# Patient Record
Sex: Female | Born: 1955 | Hispanic: No | Marital: Married | Smoking: Never smoker
Health system: Southern US, Community
[De-identification: ages and names within clinical notes are randomized; demographics above are authoritative.]

## PROBLEM LIST (undated history)

## (undated) DIAGNOSIS — L309 Dermatitis, unspecified: Secondary | ICD-10-CM

## (undated) DIAGNOSIS — I10 Essential (primary) hypertension: Secondary | ICD-10-CM

## (undated) DIAGNOSIS — M1990 Unspecified osteoarthritis, unspecified site: Secondary | ICD-10-CM

## (undated) DIAGNOSIS — I2510 Atherosclerotic heart disease of native coronary artery without angina pectoris: Secondary | ICD-10-CM

## (undated) DIAGNOSIS — M549 Dorsalgia, unspecified: Secondary | ICD-10-CM

## (undated) DIAGNOSIS — E039 Hypothyroidism, unspecified: Secondary | ICD-10-CM

## (undated) DIAGNOSIS — E079 Disorder of thyroid, unspecified: Secondary | ICD-10-CM

## (undated) DIAGNOSIS — R748 Abnormal levels of other serum enzymes: Secondary | ICD-10-CM

## (undated) DIAGNOSIS — E785 Hyperlipidemia, unspecified: Secondary | ICD-10-CM

## (undated) DIAGNOSIS — K5900 Constipation, unspecified: Secondary | ICD-10-CM

## (undated) DIAGNOSIS — K8020 Calculus of gallbladder without cholecystitis without obstruction: Secondary | ICD-10-CM

## (undated) DIAGNOSIS — R0602 Shortness of breath: Secondary | ICD-10-CM

## (undated) DIAGNOSIS — E739 Lactose intolerance, unspecified: Secondary | ICD-10-CM

## (undated) DIAGNOSIS — K219 Gastro-esophageal reflux disease without esophagitis: Secondary | ICD-10-CM

## (undated) HISTORY — DX: Unspecified osteoarthritis, unspecified site: M19.90

## (undated) HISTORY — PX: CARDIAC CATHETERIZATION: SHX172

## (undated) HISTORY — DX: Hyperlipidemia, unspecified: E78.5

## (undated) HISTORY — DX: Dorsalgia, unspecified: M54.9

## (undated) HISTORY — PX: BREAST SURGERY: SHX581

## (undated) HISTORY — DX: Disorder of thyroid, unspecified: E07.9

## (undated) HISTORY — DX: Shortness of breath: R06.02

## (undated) HISTORY — PX: LUMBAR FUSION: SHX111

## (undated) HISTORY — DX: Gastro-esophageal reflux disease without esophagitis: K21.9

---

## 2002-12-23 ENCOUNTER — Other Ambulatory Visit: Admission: RE | Admit: 2002-12-23 | Discharge: 2002-12-23 | Payer: Self-pay | Admitting: Family Medicine

## 2004-04-25 ENCOUNTER — Encounter: Admission: RE | Admit: 2004-04-25 | Discharge: 2004-07-24 | Payer: Self-pay | Admitting: Family Medicine

## 2004-05-09 ENCOUNTER — Other Ambulatory Visit: Admission: RE | Admit: 2004-05-09 | Discharge: 2004-05-09 | Payer: Self-pay | Admitting: Family Medicine

## 2006-04-02 ENCOUNTER — Other Ambulatory Visit: Admission: RE | Admit: 2006-04-02 | Discharge: 2006-04-02 | Payer: Self-pay | Admitting: Family Medicine

## 2007-04-24 ENCOUNTER — Other Ambulatory Visit: Admission: RE | Admit: 2007-04-24 | Discharge: 2007-04-24 | Payer: Self-pay | Admitting: *Deleted

## 2008-10-27 ENCOUNTER — Other Ambulatory Visit: Admission: RE | Admit: 2008-10-27 | Discharge: 2008-10-27 | Payer: Self-pay | Admitting: Interventional Radiology

## 2008-10-27 ENCOUNTER — Encounter: Admission: RE | Admit: 2008-10-27 | Discharge: 2008-10-27 | Payer: Self-pay | Admitting: Family Medicine

## 2008-10-27 ENCOUNTER — Encounter (INDEPENDENT_AMBULATORY_CARE_PROVIDER_SITE_OTHER): Payer: Self-pay | Admitting: Interventional Radiology

## 2010-01-26 ENCOUNTER — Encounter: Admission: RE | Admit: 2010-01-26 | Discharge: 2010-01-26 | Payer: Self-pay | Admitting: Surgery

## 2011-02-03 ENCOUNTER — Other Ambulatory Visit: Payer: Self-pay | Admitting: Family Medicine

## 2011-02-03 DIAGNOSIS — M545 Low back pain, unspecified: Secondary | ICD-10-CM

## 2011-02-04 ENCOUNTER — Ambulatory Visit
Admission: RE | Admit: 2011-02-04 | Discharge: 2011-02-04 | Disposition: A | Discharge status: Home / Self Care | Payer: BC Managed Care – PPO | Source: Ambulatory Visit | Attending: Family Medicine | Admitting: Family Medicine

## 2011-02-04 DIAGNOSIS — M545 Low back pain, unspecified: Secondary | ICD-10-CM

## 2011-12-04 ENCOUNTER — Other Ambulatory Visit: Payer: Self-pay | Admitting: Family Medicine

## 2011-12-04 DIAGNOSIS — E041 Nontoxic single thyroid nodule: Secondary | ICD-10-CM

## 2011-12-25 ENCOUNTER — Ambulatory Visit
Admission: RE | Admit: 2011-12-25 | Discharge: 2011-12-25 | Disposition: A | Discharge status: Home / Self Care | Payer: BC Managed Care – PPO | Source: Ambulatory Visit | Attending: Family Medicine | Admitting: Family Medicine

## 2011-12-25 DIAGNOSIS — E041 Nontoxic single thyroid nodule: Secondary | ICD-10-CM

## 2012-11-19 ENCOUNTER — Other Ambulatory Visit: Payer: Self-pay | Admitting: Family Medicine

## 2012-11-19 DIAGNOSIS — R1013 Epigastric pain: Secondary | ICD-10-CM

## 2012-11-22 ENCOUNTER — Other Ambulatory Visit: Payer: BC Managed Care – PPO

## 2012-11-28 ENCOUNTER — Ambulatory Visit
Admission: RE | Admit: 2012-11-28 | Discharge: 2012-11-28 | Disposition: A | Discharge status: Home / Self Care | Payer: BC Managed Care – PPO | Source: Ambulatory Visit | Attending: Family Medicine | Admitting: Family Medicine

## 2012-11-28 DIAGNOSIS — R1013 Epigastric pain: Secondary | ICD-10-CM

## 2012-12-03 ENCOUNTER — Encounter (INDEPENDENT_AMBULATORY_CARE_PROVIDER_SITE_OTHER): Payer: Self-pay

## 2012-12-10 ENCOUNTER — Encounter (INDEPENDENT_AMBULATORY_CARE_PROVIDER_SITE_OTHER): Payer: Self-pay | Admitting: Surgery

## 2012-12-11 ENCOUNTER — Ambulatory Visit (INDEPENDENT_AMBULATORY_CARE_PROVIDER_SITE_OTHER): Payer: BC Managed Care – PPO | Admitting: Surgery

## 2012-12-11 ENCOUNTER — Encounter (INDEPENDENT_AMBULATORY_CARE_PROVIDER_SITE_OTHER): Payer: Self-pay | Admitting: Surgery

## 2012-12-11 VITALS — BP 144/98 | HR 64 | Temp 97.0°F | Resp 20 | Ht 59.0 in | Wt 167.6 lb

## 2012-12-11 DIAGNOSIS — K8010 Calculus of gallbladder with chronic cholecystitis without obstruction: Secondary | ICD-10-CM | POA: Insufficient documentation

## 2012-12-11 NOTE — Patient Instructions (Signed)
  [facility]  LAPAROSCOPIC SURGERY - POST-OP INSTRUCTIONS  Always review your discharge instruction sheet given to you by the facility where your surgery was performed.  A prescription for pain medication may be given to you upon discharge.  Take your pain medication as prescribed.  If narcotic pain medicine is not needed, then you may take acetaminophen (Tylenol) or ibuprofen (Advil) as needed.  Take your usually prescribed medications unless otherwise directed.  If you need a refill on your pain medication, please contact your pharmacy.  They will contact our office to request authorization. Prescriptions will not be filled after 5 P.M. or on weekends.  You should follow a light diet the first few days after arrival home, such as soup and crackers or toast.  Be sure to include plenty of fluids daily.  Most patients will experience some swelling and bruising in the area of the incisions.  Ice packs will help.  Swelling and bruising can take several days to resolve.   It is common to experience some constipation if taking pain medication after surgery.  Increasing fluid intake and taking a stool softener (such as Colace) will usually help or prevent this problem from occurring.  A mild laxative (Milk of Magnesia or Miralax) should be taken according to package instructions if there are no bowel movements after 48 hours.  Unless discharge instructions indicate otherwise, you may remove your bandages 24-48 hours after surgery, and you may shower at that time.  You may have steri-strips (small skin tapes) in place directly over the incision.  These strips should be left on the skin for 7-10 days.  If your surgeon used skin glue on the incision, you may shower in 24 hours.  The glue will flake off over the next 2-3 weeks.  Any sutures or staples will be removed at the office during your follow-up visit.  ACTIVITIES:  You may resume regular (light) daily activities beginning the  next day-such as daily self-care, walking, climbing stairs-gradually increasing activities as tolerated.  You may have sexual intercourse when it is comfortable.  Refrain from any heavy lifting or straining until approved by your doctor.  You may drive when you are no longer taking prescription pain medication, you can comfortably wear a seatbelt, and you can safely maneuver your car and apply brakes.  You should see your doctor in the office for a follow-up appointment approximately 2-3 weeks after your surgery.  Make sure that you call for this appointment within a day or two after you arrive home to insure a convenient appointment time.  WHEN TO CALL YOUR DOCTOR: 1. Fever over 101.0 2. Inability to urinate 3. Continued bleeding from incision 4. Increased pain, redness, or drainage from the incision 5. Increasing abdominal pain  The clinic staff is available to answer your questions during regular business hours.  Please don't hesitate to call and ask to speak to one of the nurses for clinical concerns.  If you have a medical emergency, go to the nearest emergency room or call 911.  A surgeon from [facility] is always on call for the hospital.  Brazen Domangue M. Renika Shiflet, MD, FACS [facility] Office: [phone] Toll Free:  [phone] [phone]  Web site: [email or web address] 

## 2012-12-11 NOTE — Progress Notes (Signed)
General Surgery [facility]  Chief Complaint  Patient presents with  . Cholelithiasis    new pt- eval GB w/ stones - referral from Dr. Catha Gosselin    HISTORY: The patient is a 57 year old female from [place]. She was previously seen in my practice for thyroid nodules. She is now referred by her primary care physician for newly diagnosed symptomatic cholelithiasis.  Patient developed epigastric abdominal pain. This associated with mild nausea. She denies emesis. She notes occasional chills. She denies acholic stools. She denies jaundice. She has had no prior abdominal surgery.  Patient underwent abdominal ultrasound on 11/29/2012. This shows multiple small gallstones. There is no biliary dilatation. There is no sign of acute cholecystitis. Laboratory studies show minimal elevation of SGOT and SGPT with a normal total bilirubin.  Past Medical History  Diagnosis Date  . Back pain   . Arthritis   . Thyroid disease   . GERD (gastroesophageal reflux disease)   . Hyperlipidemia      Current Outpatient Prescriptions  Medication Sig Dispense Refill  . aspirin 81 MG tablet Take 81 mg by mouth daily.      . calcium carbonate (OS-CAL) 600 MG TABS Take 600 mg by mouth daily.      . Cholecalciferol (VITAMIN D PO) Take 200 mg by mouth daily.      Marland Kitchen levothyroxine (SYNTHROID, LEVOTHROID) 75 MCG tablet Take 75 mcg by mouth daily.      . magnesium gluconate (MAGONATE) 500 MG tablet Take 500 mg by mouth 2 (two) times daily.      . Multiple Vitamins-Minerals (MULTIVITAMIN WITH MINERALS) tablet Take 1 tablet by mouth daily.      . simvastatin (ZOCOR) 40 MG tablet Take 40 mg by mouth every evening.         No Known Allergies   Family History  Problem Relation Age of Onset  . Heart disease Father      History   Social History  . Marital Status: Married    Spouse Name: N/A    Number of Children: N/A  . Years of Education: N/A   Social History Main Topics  .  Smoking status: Never Smoker   . Smokeless tobacco: None  . Alcohol Use: No  . Drug Use: No  . Sexually Active:    Other Topics Concern  . None   Social History Narrative  . None     REVIEW OF SYSTEMS - PERTINENT POSITIVES ONLY: Intermittent epigastric and right upper quadrant abdominal pain following meals. No jaundice. No acholic stools. Occasional chills.  EXAM: Filed Vitals:   12/11/12 0938  BP: 144/98  Pulse: 64  Temp: 97 F (36.1 C)  Resp: 20    HEENT: normocephalic; pupils equal and reactive; sclerae clear; dentition good; mucous membranes moist NECK:  Palpable small nodule inferior left thyroid lobe; symmetric on extension; no palpable anterior or posterior cervical lymphadenopathy; no supraclavicular masses; no tenderness CHEST: clear to auscultation bilaterally without rales, rhonchi, or wheezes CARDIAC: regular rate and rhythm without significant murmur; peripheral pulses are full ABDOMEN: soft without distension; bowel sounds present; no mass; no hepatosplenomegaly; no hernia; minimal tenderness right upper quadrant and epigastrium EXT:  non-tender without edema; no deformity NEURO: no gross focal deficits; no sign of tremor   LABORATORY RESULTS: See [facility] (CHL-Epic) for most recent results   RADIOLOGY RESULTS: See [facility] (CHL-Epic) for most recent results   IMPRESSION: Symptomatic cholelithiasis  PLAN: I discussed the indications for surgery with  the patient and her daughter. I explained laparoscopic cholecystectomy with intraoperative cholangiography. There is a possibility of common bile duct stones due to the minimally elevated transaminases.  We discussed the hospital stay, the procedure, and the postoperative recovery to be anticipated. I provided them with written literature to review. They understand and wish to proceed with surgery in the near future.  The risks and benefits of the procedure have been discussed at length with  the patient.  The patient understands the proposed procedure, potential alternative treatments, and the course of recovery to be expected.  All of the patient's questions have been answered at this time.  The patient wishes to proceed with surgery.  Velora Heckler, MD, FACS General & Endocrine Surgery [facility]   Visit Diagnoses: 1. Cholelithiasis with cholecystitis     Primary Care Physician: No primary provider on file.

## 2012-12-18 ENCOUNTER — Encounter (HOSPITAL_COMMUNITY): Payer: Self-pay | Admitting: Pharmacy Technician

## 2012-12-18 ENCOUNTER — Encounter (HOSPITAL_COMMUNITY)
Admission: RE | Admit: 2012-12-18 | Discharge: 2012-12-18 | Disposition: A | Discharge status: Home / Self Care | Payer: BC Managed Care – PPO | Source: Ambulatory Visit | Attending: Surgery | Admitting: Surgery

## 2012-12-18 ENCOUNTER — Encounter (HOSPITAL_COMMUNITY): Payer: Self-pay

## 2012-12-18 HISTORY — DX: Dermatitis, unspecified: L30.9

## 2012-12-18 HISTORY — DX: Calculus of gallbladder without cholecystitis without obstruction: K80.20

## 2012-12-18 HISTORY — DX: Hypothyroidism, unspecified: E03.9

## 2012-12-18 HISTORY — DX: Constipation, unspecified: K59.00

## 2012-12-18 HISTORY — DX: Abnormal levels of other serum enzymes: R74.8

## 2012-12-18 HISTORY — DX: Lactose intolerance, unspecified: E73.9

## 2012-12-18 LAB — COMPREHENSIVE METABOLIC PANEL
ALT: 50 U/L — ABNORMAL HIGH (ref 0–35)
AST: 40 U/L — ABNORMAL HIGH (ref 0–37)
Albumin: 3.9 g/dL (ref 3.5–5.2)
Alkaline Phosphatase: 61 U/L (ref 39–117)
BUN: 7 mg/dL (ref 6–23)
CO2: 29 mEq/L (ref 19–32)
Calcium: 10.2 mg/dL (ref 8.4–10.5)
Chloride: 102 mEq/L (ref 96–112)
Creatinine, Ser: 0.52 mg/dL (ref 0.50–1.10)
GFR calc Af Amer: >90 mL/min (ref >90)
GFR calc non Af Amer: >90 mL/min (ref >90)
Glucose, Bld: 100 mg/dL — ABNORMAL HIGH (ref 70–99)
Potassium: 4.4 mEq/L (ref 3.5–5.1)
Sodium: 141 mEq/L (ref 135–145)
Total Bilirubin: 0.4 mg/dL (ref 0.3–1.2)
Total Protein: 7.6 g/dL (ref 6.0–8.3)

## 2012-12-18 LAB — SURGICAL PCR SCREEN
MRSA, PCR: NEGATIVE
Staphylococcus aureus: NEGATIVE

## 2012-12-18 LAB — CBC
HCT: 37.1 % (ref 36.0–46.0)
Hemoglobin: 12.4 g/dL (ref 12.0–15.0)
MCH: 28.9 pg (ref 26.0–34.0)
MCHC: 33.4 g/dL (ref 30.0–36.0)
MCV: 86.5 fL (ref 78.0–100.0)
Platelets: 304 10*3/uL (ref 150–400)
RBC: 4.29 MIL/uL (ref 3.87–5.11)
RDW: 13 % (ref 11.5–15.5)
WBC: 7.5 10*3/uL (ref 4.0–10.5)

## 2012-12-18 NOTE — Patient Instructions (Addendum)
Kyndall Fawaz  12/18/2012                           YOUR PROCEDURE IS SCHEDULED ON: 12/26/12               PLEASE REPORT TO SHORT STAY CENTER AT :  9:15 AM               CALL THIS NUMBER IF ANY PROBLEMS THE DAY OF SURGERY :               [phone]                      REMEMBER:   Do not eat food  AFTER MIDNIGHT / MAY HAVE CLEAR LIQUIDS UNTIL 6 HRS PREOP (6:00 AM)    Take these medicines the morning of surgery with A SIP OF WATER:  LEVOTHYROXINE   Do not wear jewelry, make-up   Do not wear lotions, powders, or perfumes.   Do not shave legs or underarms 12 hrs. before surgery (men may shave face)  Do not bring valuables to the hospital.  Contacts, dentures or bridgework may not be worn into surgery.  Leave suitcase in the car. After surgery it may be brought to your room.  For patients admitted to the hospital more than one night, checkout time is 11:00                          The day of discharge.   Patients discharged the day of surgery will not be allowed to drive home                             If going home same day of surgery, must have someone stay with you first                           24 hrs at home and arrange for some one to drive you home from hospital.    Special Instructions:   Please read over the following fact sheets that you were given:               1. MRSA  INFORMATION                      2. [place] PREPARING FOR SURGERY SHEET               3. STOP ALL ASPIRIN AND HERBAL PRODUCTS 5 DAYS PREOP                                                X_____________________________________________________________________        Failure to follow these instructions may result in cancellation of your surgery

## 2012-12-26 ENCOUNTER — Observation Stay (HOSPITAL_COMMUNITY)
Admission: RE | Admit: 2012-12-26 | Discharge: 2012-12-27 | Disposition: A | Discharge status: Home / Self Care | Payer: BC Managed Care – PPO | Source: Ambulatory Visit | Attending: Surgery | Admitting: Surgery

## 2012-12-26 ENCOUNTER — Encounter (HOSPITAL_COMMUNITY): Payer: Self-pay | Admitting: Anesthesiology

## 2012-12-26 ENCOUNTER — Ambulatory Visit (HOSPITAL_COMMUNITY): Payer: BC Managed Care – PPO | Admitting: Anesthesiology

## 2012-12-26 ENCOUNTER — Encounter (HOSPITAL_COMMUNITY)
Admission: RE | Disposition: A | Discharge status: Home / Self Care | Payer: Self-pay | Source: Ambulatory Visit | Attending: Surgery

## 2012-12-26 ENCOUNTER — Encounter (HOSPITAL_COMMUNITY): Payer: Self-pay | Admitting: *Deleted

## 2012-12-26 ENCOUNTER — Ambulatory Visit (HOSPITAL_COMMUNITY): Payer: BC Managed Care – PPO

## 2012-12-26 ENCOUNTER — Encounter (HOSPITAL_COMMUNITY): Payer: Self-pay | Admitting: Surgery

## 2012-12-26 DIAGNOSIS — Z7982 Long term (current) use of aspirin: Secondary | ICD-10-CM | POA: Insufficient documentation

## 2012-12-26 DIAGNOSIS — Z01812 Encounter for preprocedural laboratory examination: Secondary | ICD-10-CM | POA: Insufficient documentation

## 2012-12-26 DIAGNOSIS — E039 Hypothyroidism, unspecified: Secondary | ICD-10-CM | POA: Insufficient documentation

## 2012-12-26 DIAGNOSIS — K8010 Calculus of gallbladder with chronic cholecystitis without obstruction: Principal | ICD-10-CM | POA: Insufficient documentation

## 2012-12-26 DIAGNOSIS — E785 Hyperlipidemia, unspecified: Secondary | ICD-10-CM | POA: Insufficient documentation

## 2012-12-26 DIAGNOSIS — Z79899 Other long term (current) drug therapy: Secondary | ICD-10-CM | POA: Insufficient documentation

## 2012-12-26 DIAGNOSIS — K219 Gastro-esophageal reflux disease without esophagitis: Secondary | ICD-10-CM | POA: Insufficient documentation

## 2012-12-26 HISTORY — PX: CHOLECYSTECTOMY: SHX55

## 2012-12-26 SURGERY — LAPAROSCOPIC CHOLECYSTECTOMY WITH INTRAOPERATIVE CHOLANGIOGRAM
Anesthesia: General | Site: Abdomen | Wound class: Contaminated

## 2012-12-26 MED ORDER — CEFAZOLIN SODIUM-DEXTROSE 2-3 GM-% IV SOLR
2.0000 g | INTRAVENOUS | Status: AC
  Administered 2012-12-26: 2 g via INTRAVENOUS

## 2012-12-26 MED ORDER — BUPIVACAINE-EPINEPHRINE 0.5% -1:200000 IJ SOLN
INTRAMUSCULAR | Status: DC | PRN
  Administered 2012-12-26: 20 mL

## 2012-12-26 MED ORDER — 0.9 % SODIUM CHLORIDE (POUR BTL) OPTIME
TOPICAL | Status: DC | PRN
  Administered 2012-12-26: 1000 mL

## 2012-12-26 MED ORDER — LACTATED RINGERS IV SOLN
INTRAVENOUS | Status: DC | PRN
  Administered 2012-12-26: 1000 mL via INTRAVENOUS

## 2012-12-26 MED ORDER — IOHEXOL 300 MG/ML  SOLN
INTRAMUSCULAR | Status: DC | PRN
  Administered 2012-12-26: 12 mL via INTRAVENOUS

## 2012-12-26 MED ORDER — LACTATED RINGERS IV SOLN
INTRAVENOUS | Status: AC
  Administered 2012-12-26: 1000 mL via INTRAVENOUS

## 2012-12-26 MED ORDER — ONDANSETRON HCL 4 MG/2ML IJ SOLN
INTRAMUSCULAR | Status: DC | PRN
  Administered 2012-12-26: 4 mg via INTRAVENOUS

## 2012-12-26 MED ORDER — ACETAMINOPHEN 325 MG PO TABS: 650.0000 mg | ORAL_TABLET | ORAL | Status: DC | PRN

## 2012-12-26 MED ORDER — HYDROCODONE-ACETAMINOPHEN 5-325 MG PO TABS: 1.0000 | ORAL_TABLET | ORAL | Status: DC | PRN

## 2012-12-26 MED ORDER — METOCLOPRAMIDE HCL 5 MG/ML IJ SOLN
10.0000 mg | Freq: Once | INTRAMUSCULAR | Status: AC | PRN
  Administered 2012-12-26: 10 mg via INTRAVENOUS

## 2012-12-26 MED ORDER — ONDANSETRON HCL 4 MG PO TABS
4.0000 mg | ORAL_TABLET | Freq: Four times a day (QID) | ORAL | Status: DC | PRN
  Filled 2012-12-26: qty 1

## 2012-12-26 MED ORDER — HYDROMORPHONE HCL PF 1 MG/ML IJ SOLN: 1.0000 mg | INTRAMUSCULAR | Status: DC | PRN

## 2012-12-26 MED ORDER — KETOROLAC TROMETHAMINE 30 MG/ML IJ SOLN
INTRAMUSCULAR | Status: DC | PRN
  Administered 2012-12-26: 30 mg via INTRAVENOUS

## 2012-12-26 MED ORDER — KCL IN DEXTROSE-NACL 20-5-0.45 MEQ/L-%-% IV SOLN
INTRAVENOUS | Status: DC
  Administered 2012-12-26: 1000 mL via INTRAVENOUS
  Administered 2012-12-27: 11:00:00 via INTRAVENOUS
  Filled 2012-12-26 (×2): qty 1000

## 2012-12-26 MED ORDER — GLYCOPYRROLATE 0.2 MG/ML IJ SOLN
INTRAMUSCULAR | Status: DC | PRN
  Administered 2012-12-26: 0.6 mg via INTRAVENOUS

## 2012-12-26 MED ORDER — PROPOFOL 10 MG/ML IV BOLUS
INTRAVENOUS | Status: DC | PRN
  Administered 2012-12-26: 120 mg via INTRAVENOUS

## 2012-12-26 MED ORDER — LIDOCAINE HCL (CARDIAC) 20 MG/ML IV SOLN
INTRAVENOUS | Status: DC | PRN
  Administered 2012-12-26: 100 mg via INTRAVENOUS

## 2012-12-26 MED ORDER — DEXAMETHASONE SODIUM PHOSPHATE 10 MG/ML IJ SOLN
INTRAMUSCULAR | Status: DC | PRN
  Administered 2012-12-26: 10 mg via INTRAVENOUS

## 2012-12-26 MED ORDER — OXYCODONE HCL 5 MG/5ML PO SOLN
5.0000 mg | Freq: Once | ORAL | Status: DC | PRN
  Filled 2012-12-26: qty 5

## 2012-12-26 MED ORDER — OXYCODONE HCL 5 MG PO TABS: 5.0000 mg | ORAL_TABLET | Freq: Once | ORAL | Status: DC | PRN

## 2012-12-26 MED ORDER — ROCURONIUM BROMIDE 100 MG/10ML IV SOLN
INTRAVENOUS | Status: DC | PRN
  Administered 2012-12-26: 30 mg via INTRAVENOUS
  Administered 2012-12-26: 10 mg via INTRAVENOUS

## 2012-12-26 MED ORDER — FENTANYL CITRATE 0.05 MG/ML IJ SOLN
INTRAMUSCULAR | Status: DC | PRN
  Administered 2012-12-26 (×2): 50 ug via INTRAVENOUS

## 2012-12-26 MED ORDER — NEOSTIGMINE METHYLSULFATE 1 MG/ML IJ SOLN
INTRAMUSCULAR | Status: DC | PRN
  Administered 2012-12-26: 3 mg via INTRAVENOUS

## 2012-12-26 MED ORDER — ONDANSETRON HCL 4 MG/2ML IJ SOLN
4.0000 mg | Freq: Four times a day (QID) | INTRAMUSCULAR | Status: DC | PRN
  Administered 2012-12-26: 4 mg via INTRAVENOUS
  Filled 2012-12-26: qty 2

## 2012-12-26 MED ORDER — HYDROMORPHONE HCL PF 1 MG/ML IJ SOLN
0.2500 mg | INTRAMUSCULAR | Status: DC | PRN
  Administered 2012-12-26 (×2): 0.5 mg via INTRAVENOUS

## 2012-12-26 MED ORDER — EPHEDRINE SULFATE 50 MG/ML IJ SOLN
INTRAMUSCULAR | Status: DC | PRN
  Administered 2012-12-26 (×2): 5 mg via INTRAVENOUS

## 2012-12-26 MED ORDER — MIDAZOLAM HCL 5 MG/5ML IJ SOLN
INTRAMUSCULAR | Status: DC | PRN
  Administered 2012-12-26: 2 mg via INTRAVENOUS

## 2012-12-26 MED ORDER — SUCCINYLCHOLINE CHLORIDE 20 MG/ML IJ SOLN
INTRAMUSCULAR | Status: DC | PRN
  Administered 2012-12-26: 100 mg via INTRAVENOUS

## 2012-12-26 SURGICAL SUPPLY — 42 items
APL SKNCLS STERI-STRIP NONHPOA (GAUZE/BANDAGES/DRESSINGS) ×1
APPLIER CLIP ROT 10 11.4 M/L (STAPLE) ×2
APR CLP MED LRG 11.4X10 (STAPLE) ×1
BAG SPEC RTRVL LRG 6X4 10 (ENDOMECHANICALS) ×1
BENZOIN TINCTURE PRP APPL 2/3 (GAUZE/BANDAGES/DRESSINGS) ×2 IMPLANT
CABLE HIGH FREQUENCY MONO STRZ (ELECTRODE) ×2 IMPLANT
CANISTER SUCTION 2500CC (MISCELLANEOUS) ×2 IMPLANT
CHLORAPREP W/TINT 26ML (MISCELLANEOUS) ×2 IMPLANT
CLIP APPLIE ROT 10 11.4 M/L (STAPLE) ×1 IMPLANT
CLOTH BEACON ORANGE TIMEOUT ST (SAFETY) ×2 IMPLANT
COVER MAYO STAND STRL (DRAPES) ×2 IMPLANT
DECANTER SPIKE VIAL GLASS SM (MISCELLANEOUS) ×2 IMPLANT
DRAPE C-ARM 42X72 X-RAY (DRAPES) ×2 IMPLANT
DRAPE LAPAROSCOPIC ABDOMINAL (DRAPES) ×2 IMPLANT
DRAPE UTILITY 15X26 (DRAPE) ×2 IMPLANT
ELECT REM PT RETURN 9FT ADLT (ELECTROSURGICAL) ×2
ELECTRODE REM PT RTRN 9FT ADLT (ELECTROSURGICAL) ×1 IMPLANT
GLOVE BIOGEL PI IND STRL 7.0 (GLOVE) ×1 IMPLANT
GLOVE BIOGEL PI INDICATOR 7.0 (GLOVE) ×1
GLOVE SURG ORTHO 8.0 STRL STRW (GLOVE) ×2 IMPLANT
GOWN STRL NON-REIN LRG LVL3 (GOWN DISPOSABLE) ×2 IMPLANT
GOWN STRL REIN XL XLG (GOWN DISPOSABLE) ×4 IMPLANT
HEMOSTAT SURGICEL 4X8 (HEMOSTASIS) IMPLANT
KIT BASIN OR (CUSTOM PROCEDURE TRAY) ×2 IMPLANT
NS IRRIG 1000ML POUR BTL (IV SOLUTION) ×2 IMPLANT
POUCH SPECIMEN RETRIEVAL 10MM (ENDOMECHANICALS) ×2 IMPLANT
SCISSORS LAP 5X35 DISP (ENDOMECHANICALS) ×1 IMPLANT
SET CHOLANGIOGRAPH MIX (MISCELLANEOUS) ×2 IMPLANT
SET IRRIG TUBING LAPAROSCOPIC (IRRIGATION / IRRIGATOR) ×2 IMPLANT
SLEEVE Z-THREAD 5X100MM (TROCAR) ×2 IMPLANT
SOLUTION ANTI FOG 6CC (MISCELLANEOUS) ×2 IMPLANT
STRIP CLOSURE SKIN 1/2X4 (GAUZE/BANDAGES/DRESSINGS) ×2 IMPLANT
SUT MNCRL AB 4-0 PS2 18 (SUTURE) ×2 IMPLANT
TOWEL OR 17X26 10 PK STRL BLUE (TOWEL DISPOSABLE) ×6 IMPLANT
TRAY LAP CHOLE (CUSTOM PROCEDURE TRAY) ×2 IMPLANT
TROCAR BLADELESS OPT 5 75 (ENDOMECHANICALS) ×1 IMPLANT
TROCAR SLEEVE XCEL 5X75 (ENDOMECHANICALS) ×1 IMPLANT
TROCAR XCEL BLUNT TIP 100MML (ENDOMECHANICALS) ×3 IMPLANT
TROCAR XCEL NON-BLD 11X100MML (ENDOMECHANICALS) ×1 IMPLANT
TROCAR Z-THREAD FIOS 11X100 BL (TROCAR) ×2 IMPLANT
TROCAR Z-THREAD FIOS 5X100MM (TROCAR) ×4 IMPLANT
TUBING INSUFFLATION 10FT LAP (TUBING) ×2 IMPLANT

## 2012-12-26 NOTE — Anesthesia Postprocedure Evaluation (Signed)
Anesthesia Post Note  Patient: Radiation protection practitioner  Procedure(s) Performed: Procedure(s) (LRB): LAPAROSCOPIC CHOLECYSTECTOMY WITH INTRAOPERATIVE CHOLANGIOGRAM (N/A)  Anesthesia type: general  Patient location: PACU  Post pain: Pain level controlled  Post assessment: Patient's Cardiovascular Status Stable  Last Vitals:  Filed Vitals:   12/26/12 1345  BP: 111/62  Pulse: 52  Temp:   Resp: 13    Post vital signs: Reviewed and stable  Level of consciousness: sedated  Complications: No apparent anesthesia complications

## 2012-12-26 NOTE — Care Management Note (Signed)
    Page 1 of 1   12/26/2012     2:38:27 PM   CARE MANAGEMENT NOTE 12/26/2012  Patient:  Linda Cardenas,Linda Cardenas   Account Number:  [id number]  Date Initiated:  12/26/2012  Documentation initiated by:  Lorenda Ishihara  Subjective/Objective Assessment:   57 yo female admitted s/p lap chole. PTA lived at home with spouse.     Action/Plan:   home when stable   Anticipated DC Date:  12/27/2012   Anticipated DC Plan:  HOME/SELF CARE      DC Planning Services  CM consult      Choice offered to / List presented to:             Status of service:  Completed, signed off Medicare Important Message given?   (If response is "NO", the following Medicare IM given date fields will be blank) Date Medicare IM given:   Date Additional Medicare IM given:    Discharge Disposition:  HOME/SELF CARE  Per UR Regulation:  Reviewed for med. necessity/level of care/duration of stay  If discussed at Long Length of Stay Meetings, dates discussed:    Comments:

## 2012-12-26 NOTE — Anesthesia Preprocedure Evaluation (Signed)
Anesthesia Evaluation  Patient identified by MRN, date of birth, ID band Patient awake    Reviewed: Allergy & Precautions, H&P , NPO status , Patient's Chart, lab work & pertinent test results, reviewed documented beta blocker date and time   Airway Mallampati: II TM Distance: >3 FB Neck ROM: full    Dental   Pulmonary neg pulmonary ROS,  breath sounds clear to auscultation        Cardiovascular negative cardio ROS  Rhythm:regular     Neuro/Psych negative neurological ROS  negative psych ROS   GI/Hepatic Neg liver ROS, GERD-  Medicated and Controlled,  Endo/Other  Hypothyroidism   Renal/GU negative Renal ROS  negative genitourinary   Musculoskeletal   Abdominal   Peds  Hematology negative hematology ROS (+)   Anesthesia Other Findings See surgeon's H&P   Reproductive/Obstetrics negative OB ROS                           Anesthesia Physical Anesthesia Plan  ASA: II  Anesthesia Plan: General   Post-op Pain Management:    Induction: Intravenous  Airway Management Planned: Oral ETT  Additional Equipment:   Intra-op Plan:   Post-operative Plan: Extubation in OR  Informed Consent: I have reviewed the patients History and Physical, chart, labs and discussed the procedure including the risks, benefits and alternatives for the proposed anesthesia with the patient or authorized representative who has indicated his/her understanding and acceptance.   Dental Advisory Given  Plan Discussed with: CRNA and Surgeon  Anesthesia Plan Comments:         Anesthesia Quick Evaluation

## 2012-12-26 NOTE — H&P (View-Only) (Signed)
General Surgery - [facility]  Chief Complaint  Patient presents with  . Cholelithiasis    new pt- eval GB w/ stones - referral from Dr. Kevin Little    HISTORY: The patient is a 57-year-old female from [place]. She was previously seen in my practice for thyroid nodules. She is now referred by her primary care physician for newly diagnosed symptomatic cholelithiasis.  Patient developed epigastric abdominal pain. This associated with mild nausea. She denies emesis. She notes occasional chills. She denies acholic stools. She denies jaundice. She has had no prior abdominal surgery.  Patient underwent abdominal ultrasound on 11/29/2012. This shows multiple small gallstones. There is no biliary dilatation. There is no sign of acute cholecystitis. Laboratory studies show minimal elevation of SGOT and SGPT with a normal total bilirubin.  Past Medical History  Diagnosis Date  . Back pain   . Arthritis   . Thyroid disease   . GERD (gastroesophageal reflux disease)   . Hyperlipidemia      Current Outpatient Prescriptions  Medication Sig Dispense Refill  . aspirin 81 MG tablet Take 81 mg by mouth daily.      . calcium carbonate (OS-CAL) 600 MG TABS Take 600 mg by mouth daily.      . Cholecalciferol (VITAMIN D PO) Take 200 mg by mouth daily.      . levothyroxine (SYNTHROID, LEVOTHROID) 75 MCG tablet Take 75 mcg by mouth daily.      . magnesium gluconate (MAGONATE) 500 MG tablet Take 500 mg by mouth 2 (two) times daily.      . Multiple Vitamins-Minerals (MULTIVITAMIN WITH MINERALS) tablet Take 1 tablet by mouth daily.      . simvastatin (ZOCOR) 40 MG tablet Take 40 mg by mouth every evening.         No Known Allergies   Family History  Problem Relation Age of Onset  . Heart disease Father      History   Social History  . Marital Status: Married    Spouse Name: N/A    Number of Children: N/A  . Years of Education: N/A   Social History Main Topics  .  Smoking status: Never Smoker   . Smokeless tobacco: None  . Alcohol Use: No  . Drug Use: No  . Sexually Active:    Other Topics Concern  . None   Social History Narrative  . None     REVIEW OF SYSTEMS - PERTINENT POSITIVES ONLY: Intermittent epigastric and right upper quadrant abdominal pain following meals. No jaundice. No acholic stools. Occasional chills.  EXAM: Filed Vitals:   12/11/12 0938  BP: 144/98  Pulse: 64  Temp: 97 F (36.1 C)  Resp: 20    HEENT: normocephalic; pupils equal and reactive; sclerae clear; dentition good; mucous membranes moist NECK:  Palpable small nodule inferior left thyroid lobe; symmetric on extension; no palpable anterior or posterior cervical lymphadenopathy; no supraclavicular masses; no tenderness CHEST: clear to auscultation bilaterally without rales, rhonchi, or wheezes CARDIAC: regular rate and rhythm without significant murmur; peripheral pulses are full ABDOMEN: soft without distension; bowel sounds present; no mass; no hepatosplenomegaly; no hernia; minimal tenderness right upper quadrant and epigastrium EXT:  non-tender without edema; no deformity NEURO: no gross focal deficits; no sign of tremor   LABORATORY RESULTS: See [facility] (CHL-Epic) for most recent results   RADIOLOGY RESULTS: See [facility] (CHL-Epic) for most recent results   IMPRESSION: Symptomatic cholelithiasis  PLAN: I discussed the indications for surgery with   the patient and her daughter. I explained laparoscopic cholecystectomy with intraoperative cholangiography. There is a possibility of common bile duct stones due to the minimally elevated transaminases.  We discussed the hospital stay, the procedure, and the postoperative recovery to be anticipated. I provided them with written literature to review. They understand and wish to proceed with surgery in the near future.  The risks and benefits of the procedure have been discussed at length with  the patient.  The patient understands the proposed procedure, potential alternative treatments, and the course of recovery to be expected.  All of the patient's questions have been answered at this time.  The patient wishes to proceed with surgery.  Haydin Calandra M. Jamison Yuhasz, MD, FACS General & Endocrine Surgery [facility]   Visit Diagnoses: 1. Cholelithiasis with cholecystitis     Primary Care Physician: No primary provider on file.   

## 2012-12-26 NOTE — Preoperative (Signed)
Beta Blockers   Reason not to administer Beta Blockers:Not Applicable 

## 2012-12-26 NOTE — Op Note (Signed)
Procedure Note  Pre-operative Diagnosis: Calculus of gallbladder with other cholecystitis, without mention of obstruction  Post-operative Diagnosis: Same  Surgeon:  Velora Heckler, MD, FACS  Procedure:  Laparoscopic cholecystectomy with intra-operative cholangiography  Assistant:  Luretha Murphy, MD, FACS   Anesthesia:  General  Indications: This patient presents with symptomatic gallbladder disease and will undergo laparoscopic cholecystectomy with intraoperative cholangiography.  Procedure Details: The patient was seen in the pre-op holding area. The risks, benefits, complications, treatment options, and expected outcomes have been discussed with the patient. The patient and/or family agreed with the proposed plan and signed the informed consent form.  The patient was taken to Operating Room, identified as Linda Cardenas and the procedure verified as Laparoscopic Cholecystectomy with Intraoperative Cholangiogram. A "time out" was completed and the above information confirmed.  Prior to the induction of general anesthesia, antibiotic prophylaxis was administered. General endotracheal anesthesia was then administered and tolerated well. After the induction, the abdomen was prepped in the usual strict aseptic fashion. The patient was in the supine position.  An incision was made in the skin near the umbilicus. The midline fascia was incised and the peritoneal cavity entered and the Hasson canula was introduced under direct vision. Hasson canula was secured with a pursestring 0-Vicryl suture. Pneumoperitoneum was then established with carbon dioxide and tolerated well without any adverse changes in the patient's vital signs. Additional trocars were introduced under direct vision along the right costal margin in the midline, mid-clavicular line, and anterior axillary line.  The gallbladder was identified and the fundus grasped and retracted cephalad. Adhesions were taken down bluntly and with  the electrocautery as needed, taking care not to injure any adjacent structures. The infundibulum was grasped and retracted laterally, exposing the peritoneum overlying the triangle of Calot. This was incised and structures exposed in a blunt fashion. The cystic duct was clearly identified and bluntly dissected circumferentially and clipped at the neck of the gallbladder.  An incision was made in the cystic duct and the cholangiogram catheter introduced. The catheter was secured using an ligaclip.  Real-time cholangiography was performed using the C-arm.  There was rapid filling of a normal caliber common bile duct.  There was reflux of contrast into the left and right hepatic ductal systems.  There was free flow distally into the duodenum without filling defect or obstruction.  Catheter was removed from the peritoneal cavity.  The cystic duct was then triply ligated with surgical clips and divided. The cystic artery was identified, dissected circumferentially, ligated with ligaclips, and divided.   The gallbladder was dissected from the liver bed with the electrocautery used for hemostasis. The gallbladder was completely removed and placed into an endocatch bag. The right upper quadrant was irrigated and inspected. Hemostasis was achieved with the electrocautery. Warm saline irrigation was utilized and was repeatedly aspirated until clear.  Pneumoperitoneum was released after viewing removal of the trocars with good hemostasis noted. The umbilical wound was irrigated and the fascia was then closed with the pursestring suture.  The skin was then closed with 4-0 Monocril subcuticular sutures and sterile dressings were applied.  Instrument, sponge, and needle counts were correct at the conclusion of the case.  The patient tolerated the procedure well.  Estimated Blood Loss: Minimal         Drains: none         Specimens: Gallbladder to pathology         Disposition: PACU - hemodynamically stable.  Condition: stable  Velora Heckler, MD, [facility] Office: [phone]

## 2012-12-26 NOTE — Transfer of Care (Signed)
......  Immediate Anesthesia Transfer of Care Note  Patient: Linda Cardenas  Procedure(s) Performed: Procedure(s) (LRB): LAPAROSCOPIC CHOLECYSTECTOMY WITH INTRAOPERATIVE CHOLANGIOGRAM (N/A)  Patient Location: PACU  Anesthesia Type: General  Level of Consciousness: sedated, patient cooperative and responds to stimulaton  Airway & Oxygen Therapy: Patient Spontanous Breathing and Patient connected to face mask oxgen  Post-op Assessment: Report given to PACU RN and Post -op Vital signs reviewed and stable  Post vital signs: Reviewed and stable  Complications: No apparent anesthesia complications

## 2012-12-26 NOTE — Interval H&P Note (Signed)
History and Physical Interval Note:  12/26/2012 11:22 AM  Linda Cardenas  has presented today for surgery, with the diagnosis of symptomatic cholelithasis.  The various methods of treatment have been discussed with the patient and family. After consideration of risks, benefits and other options for treatment, the patient has consented to    Procedure(s) (LRB) with comments: LAPAROSCOPIC CHOLECYSTECTOMY WITH INTRAOPERATIVE CHOLANGIOGRAM (N/A) as a surgical intervention .    The patient's history has been reviewed, patient examined, no change in status, stable for surgery.  I have reviewed the patient's chart and labs.  Questions were answered to the patient's satisfaction.    Velora Heckler, MD, [facility] Office: [phone]    Linda Cardenas Judie Petit

## 2012-12-27 ENCOUNTER — Encounter (HOSPITAL_COMMUNITY): Payer: Self-pay | Admitting: Surgery

## 2012-12-27 MED ORDER — HYDROCODONE-ACETAMINOPHEN 5-325 MG PO TABS: 1.0000 | ORAL_TABLET | ORAL | Status: DC | PRN

## 2012-12-27 NOTE — Progress Notes (Signed)
Patient discharged via wheelchair. Rx for vicodin given. States understanding of discharge instructions.

## 2012-12-27 NOTE — Discharge Summary (Signed)
Physician Discharge Summary [facility]  Patient ID: Linda Cardenas MRN: [id number] DOB/AGE: January 14, 1956 57 y.o.  Admit date: 12/26/2012 Discharge date: 12/27/2012  Admission Diagnoses:  Symptomatic cholelithiasis   Discharge Diagnoses:  Principal Problem:  *Cholelithiasis with cholecystitis   Discharged Condition: good  Hospital Course: patient admitted for observation after lap chole.  Post op course uncomplicated.  Tolerated liquids and light diet.  Prepared for discharge home on POD#1.  Consults: None  Significant Diagnostic Studies: none   Treatments: surgery: lap chole with IOC  Discharge Exam: Blood pressure 126/79, pulse 71, temperature 98.1 F (36.7 C), temperature source Oral, resp. rate 18, height 4\' 11"  (1.499 m), weight 163 lb 4 oz (74.05 kg), SpO2 96.00%. HEENT - clear Chest - clear bilat Cor - RRR Abd - soft without distension; dressings dry and intact  Disposition: Home with family  Discharge Orders    Future Appointments: Provider: Department: Dept Phone: Center:   01/08/2013 11:15 AM Velora Heckler, MD [facility] [phone] None     Future Orders Please Complete By Expires   Diet - low sodium heart healthy      Increase activity slowly      Discharge instructions      Comments:   [facility]  LAPAROSCOPIC SURGERY - POST-OP INSTRUCTIONS  Always review your discharge instruction sheet given to you by the facility where your surgery was performed.  A prescription for pain medication may be given to you upon discharge.  Take your pain medication as prescribed.  If narcotic pain medicine is not needed, then you may take acetaminophen (Tylenol) or ibuprofen (Advil) as needed.  Take your usually prescribed medications unless otherwise directed.  If you need a refill on your pain medication, please contact your pharmacy.  They will contact our office to request authorization. Prescriptions will not be  filled after 5 P.M. or on weekends.  You should follow a light diet the first few days after arrival home, such as soup and crackers or toast.  Be sure to include plenty of fluids daily.  Most patients will experience some swelling and bruising in the area of the incisions.  Ice packs will help.  Swelling and bruising can take several days to resolve.   It is common to experience some constipation if taking pain medication after surgery.  Increasing fluid intake and taking a stool softener (such as Colace) will usually help or prevent this problem from occurring.  A mild laxative (Milk of Magnesia or Miralax) should be taken according to package instructions if there are no bowel movements after 48 hours.  Unless discharge instructions indicate otherwise, you may remove your bandages 24-48 hours after surgery, and you may shower at that time.  You may have steri-strips (small skin tapes) in place directly over the incision.  These strips should be left on the skin for 7-10 days.  If your surgeon used skin glue on the incision, you may shower in 24 hours.  The glue will flake off over the next 2-3 weeks.  Any sutures or staples will be removed at the office during your follow-up visit.  ACTIVITIES:  You may resume regular (light) daily activities beginning the next day-such as daily self-care, walking, climbing stairs-gradually increasing activities as tolerated.  You may have sexual intercourse when it is comfortable.  Refrain from any heavy lifting or straining until approved by your doctor.  You may drive when you are no longer taking prescription pain medication,  you can comfortably wear a seatbelt, and you can safely maneuver your car and apply brakes.  You should see your doctor in the office for a follow-up appointment approximately 2-3 weeks after your surgery.  Make sure that you call for this appointment within a day or two after you arrive home to insure a convenient appointment time.  WHEN  TO CALL YOUR DOCTOR: Fever over 101.0 Inability to urinate Continued bleeding from incision Increased pain, redness, or drainage from the incision Increasing abdominal pain  The clinic staff is available to answer your questions during regular business hours.  Please don't hesitate to call and ask to speak to one of the nurses for clinical concerns.  If you have a medical emergency, go to the nearest emergency room or call 911.  A surgeon from [facility] is always on call for the hospital.  Velora Heckler, MD, [facility] Office: [phone] Toll Free:  [phone] FAX [phone]  Web site: [email or web address]   Remove dressing in 24 hours          Medication List     As of 12/27/2012  9:37 PM    TAKE these medications         aspirin 81 MG tablet   Take 81 mg by mouth daily.      calcium carbonate 600 MG Tabs   Commonly known as: OS-CAL   Take 600 mg by mouth daily.      HYDROcodone-acetaminophen 5-325 MG per tablet   Commonly known as: NORCO/VICODIN   Take 1-2 tablets by mouth every 4 (four) hours as needed for pain.      levothyroxine 75 MCG tablet   Commonly known as: SYNTHROID, LEVOTHROID   Take 75 mcg by mouth daily before breakfast.      magnesium gluconate 500 MG tablet   Commonly known as: MAGONATE   Take 500 mg by mouth daily as needed. constipation      multivitamin with minerals tablet   Take 1 tablet by mouth daily.      NON FORMULARY   Spinal shot once per year-last time 01/15/13.Dr Eduard Clos in [place] [phone]      simvastatin 40 MG tablet   Commonly known as: ZOCOR   Take 40 mg by mouth every evening.      Vitamin D 2000 UNITS tablet   Take 2,000 Units by mouth daily.         Velora Heckler, MD, [facility] Office: [phone]   Signed: Velora Heckler 12/27/2012, 9:37 PM

## 2012-12-30 ENCOUNTER — Telehealth (INDEPENDENT_AMBULATORY_CARE_PROVIDER_SITE_OTHER): Payer: Self-pay

## 2012-12-30 NOTE — Telephone Encounter (Signed)
Pt home doing well. Has po appt.

## 2013-01-08 ENCOUNTER — Ambulatory Visit (INDEPENDENT_AMBULATORY_CARE_PROVIDER_SITE_OTHER): Payer: BC Managed Care – PPO | Admitting: Surgery

## 2013-01-08 ENCOUNTER — Encounter (INDEPENDENT_AMBULATORY_CARE_PROVIDER_SITE_OTHER): Payer: Self-pay | Admitting: Surgery

## 2013-01-08 ENCOUNTER — Encounter (INDEPENDENT_AMBULATORY_CARE_PROVIDER_SITE_OTHER): Payer: Self-pay

## 2013-01-08 VITALS — BP 132/70 | HR 68 | Temp 97.8°F | Resp 24 | Ht 59.0 in | Wt 161.0 lb

## 2013-01-08 DIAGNOSIS — K8010 Calculus of gallbladder with chronic cholecystitis without obstruction: Secondary | ICD-10-CM

## 2013-01-08 NOTE — Patient Instructions (Signed)
  COCOA BUTTER & VITAMIN E CREAM  ([employer] or other brand)  Apply cocoa butter/vitamin E cream to your incision 2 - 3 times daily.  Massage cream into incision for one minute with each application.  Use sunscreen (50 SPF or higher) for first 6 months after surgery if area is exposed to sun.  You may substitute Mederma or other scar reducing creams as desired.   

## 2013-01-08 NOTE — Progress Notes (Signed)
General Surgery [facility]  Visit Diagnoses: 1. Cholelithiasis with cholecystitis     HISTORY: Patient is a 57 year old female who underwent laparoscopic cholecystectomy for chronic cholecystitis and cholelithiasis on 12/26/2012. Postoperative course has been uneventful.  EXAM: Abdomen is soft non-tender without distention. Surgical wounds are well-healed. Remaining Steri-Strips are removed. No sign of infection. No sign of herniation  IMPRESSION: Status post laparoscopic cholecystectomy  PLAN: Patient is released to full activity without restriction. I've asked her to avoid heavy lifting for another 2 weeks. She will begin applying topical creams to her incisions.  Patient will return for surgical follow-up as needed.  Velora Heckler, MD, FACS General & Endocrine Surgery [facility]

## 2013-01-15 ENCOUNTER — Telehealth (INDEPENDENT_AMBULATORY_CARE_PROVIDER_SITE_OTHER): Payer: Self-pay | Admitting: General Surgery

## 2013-01-15 NOTE — Telephone Encounter (Signed)
Pt's daughter called to report her Mom complains of nausea frequently.  She had lap chole on 12/26/12, has had her post op visit and has returned to normal diet.  She does not vomit, but has nausea.  Recommended she contact her PCP for evaluation.

## 2013-01-22 ENCOUNTER — Encounter (INDEPENDENT_AMBULATORY_CARE_PROVIDER_SITE_OTHER): Payer: BC Managed Care – PPO | Admitting: Surgery

## 2013-01-27 ENCOUNTER — Encounter (INDEPENDENT_AMBULATORY_CARE_PROVIDER_SITE_OTHER): Payer: BC Managed Care – PPO | Admitting: Surgery

## 2013-08-27 ENCOUNTER — Other Ambulatory Visit: Payer: Self-pay | Admitting: Family Medicine

## 2013-08-27 DIAGNOSIS — R7989 Other specified abnormal findings of blood chemistry: Secondary | ICD-10-CM

## 2013-09-11 ENCOUNTER — Ambulatory Visit
Admission: RE | Admit: 2013-09-11 | Discharge: 2013-09-11 | Disposition: A | Discharge status: Home / Self Care | Payer: BC Managed Care – PPO | Source: Ambulatory Visit | Attending: Family Medicine | Admitting: Family Medicine

## 2013-09-11 DIAGNOSIS — R7989 Other specified abnormal findings of blood chemistry: Secondary | ICD-10-CM

## 2013-10-01 ENCOUNTER — Other Ambulatory Visit: Payer: Self-pay | Admitting: Urology

## 2013-10-01 ENCOUNTER — Ambulatory Visit
Admission: RE | Admit: 2013-10-01 | Discharge: 2013-10-01 | Disposition: A | Discharge status: Home / Self Care | Payer: BC Managed Care – PPO | Source: Ambulatory Visit | Attending: Neurosurgery | Admitting: Neurosurgery

## 2013-10-01 ENCOUNTER — Other Ambulatory Visit: Payer: Self-pay | Admitting: Neurosurgery

## 2013-10-01 DIAGNOSIS — R52 Pain, unspecified: Secondary | ICD-10-CM

## 2014-10-01 ENCOUNTER — Other Ambulatory Visit: Payer: Self-pay | Admitting: Family Medicine

## 2014-10-01 ENCOUNTER — Other Ambulatory Visit (HOSPITAL_COMMUNITY)
Admission: RE | Admit: 2014-10-01 | Discharge: 2014-10-01 | Disposition: A | Discharge status: Home / Self Care | Payer: BC Managed Care – PPO | Source: Ambulatory Visit | Attending: Family Medicine | Admitting: Family Medicine

## 2014-10-01 DIAGNOSIS — Z1151 Encounter for screening for human papillomavirus (HPV): Secondary | ICD-10-CM | POA: Diagnosis present

## 2014-10-01 DIAGNOSIS — Z124 Encounter for screening for malignant neoplasm of cervix: Secondary | ICD-10-CM | POA: Insufficient documentation

## 2014-10-02 ENCOUNTER — Other Ambulatory Visit: Payer: Self-pay | Admitting: Family Medicine

## 2014-10-02 DIAGNOSIS — E039 Hypothyroidism, unspecified: Secondary | ICD-10-CM

## 2014-10-06 ENCOUNTER — Other Ambulatory Visit: Payer: BC Managed Care – PPO

## 2014-10-06 LAB — CYTOLOGY - PAP

## 2014-11-24 ENCOUNTER — Other Ambulatory Visit: Payer: BC Managed Care – PPO

## 2014-11-25 ENCOUNTER — Ambulatory Visit
Admission: RE | Admit: 2014-11-25 | Discharge: 2014-11-25 | Disposition: A | Discharge status: Home / Self Care | Payer: BC Managed Care – PPO | Source: Ambulatory Visit | Attending: Family Medicine | Admitting: Family Medicine

## 2014-11-25 DIAGNOSIS — E039 Hypothyroidism, unspecified: Secondary | ICD-10-CM

## 2015-05-27 ENCOUNTER — Ambulatory Visit: Payer: BLUE CROSS/BLUE SHIELD | Attending: Family Medicine

## 2015-05-27 DIAGNOSIS — M6289 Other specified disorders of muscle: Secondary | ICD-10-CM | POA: Diagnosis present

## 2015-05-27 DIAGNOSIS — M5441 Lumbago with sciatica, right side: Secondary | ICD-10-CM

## 2015-05-27 DIAGNOSIS — M25659 Stiffness of unspecified hip, not elsewhere classified: Secondary | ICD-10-CM | POA: Diagnosis present

## 2015-05-27 DIAGNOSIS — R29898 Other symptoms and signs involving the musculoskeletal system: Secondary | ICD-10-CM

## 2015-05-27 NOTE — Therapy (Signed)
[facility] [address] [place], [place], [address] Phone: [phone]   Fax:  [phone]  Physical Therapy Evaluation  Patient Details  Name: Linda Cardenas MRN: [id number] Date of Birth: [birth year] Referring Provider:  Ileana Ladd, MD  Encounter Date: 05/27/2015      PT End of Session - 05/27/15 1147    Visit Number 1   Date for PT Re-Evaluation 07/22/15   PT Start Time 1104   PT Stop Time 1159   PT Time Calculation (min) 55 min   Activity Tolerance Patient tolerated treatment well   Behavior During Therapy [facility] for tasks assessed/performed      Past Medical History  Diagnosis Date  . Back pain      CHRONIC LOW BACK PAIN - GETS INJECTIONS IN BACK  . Arthritis   . Thyroid disease   . GERD (gastroesophageal reflux disease)   . Hyperlipidemia   . Eczema   . Constipation   . Lactose intolerance   . Gallstones   . Hypothyroidism   . Elevated liver enzymes     Past Surgical History  Procedure Laterality Date  . Cesarean section    . Breast surgery      "PIMPLE" REMOVED FROM BREAST IN OFFICE  . Cholecystectomy  12/26/2012    Procedure: LAPAROSCOPIC CHOLECYSTECTOMY WITH INTRAOPERATIVE CHOLANGIOGRAM;  Surgeon: Velora Heckler, MD;  Location: [facility];  Service: General;  Laterality: N/A;  . Lumbar fusion N/A L4/5 04/2013    There were no vitals filed for this visit.  Visit Diagnosis:  Right-sided low back pain with right-sided sciatica - Plan: PT plan of care cert/re-cert  Hip stiffness, unspecified laterality - Plan: PT plan of care cert/re-cert  Weakness of both hips - Plan: PT plan of care cert/re-cert      Subjective Assessment - 05/27/15 1055    Subjective Pt presents to PT with complaints of Rt foot pain and LBP that began 02/2015.  Pt had lumbar fusion in 2014 and began having new onset (02/2015) of pain after lifting a 50lb object.        Patient is accompained by: Family member   Pertinent  History L4/L5 fusion 2014   Limitations Standing;Sitting   How long can you sit comfortably? 30 minutes   Diagnostic tests x-ray 04/20/15- surgery location looks good.  No abonormality   Patient Stated Goals reduce pain, sit longer, lift without pain, push objects. drive with Rt foot with less pain   Currently in Pain? Yes   Pain Score 7    Pain Location Back   Pain Orientation Right;Left;Lower   Pain Descriptors / Indicators Aching;Burning   Pain Type Chronic pain   Pain Radiating Towards Rt medial foot- burning and constant   Pain Onset More than a month ago   Pain Frequency Constant   Aggravating Factors  cooking in the kitchen, driving with Rt foot, walking into work, end of work day   Pain Relieving Factors laying down, ibuprofen            [facility] PT Assessment - 05/27/15 0001    Assessment   Medical Diagnosis Lumbar radiculopathy (M54.16) Chronic back pain s/p lumbar fusion   Onset Date/Surgical Date 04/26/13  surgery 2014   Next MD Visit 07/2015   Prior Therapy last year   Precautions   Precautions None   Restrictions   Weight Bearing Restrictions No   Balance Screen   Has the patient fallen in the past 6  months No   Has the patient had a decrease in activity level because of a fear of falling?  No   Is the patient reluctant to leave their home because of a fear of falling?  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     PROM   Overall PROM  Deficits   Overall PROM Comments Bilateral hip flexibility is limited by 50-75% throughout with pain in the lumbar spine reported with all PROM testing.   Strength   Overall Strength Deficits   Overall Strength Comments 4/5 bilateral hip flexors, knees 4+/5, ankle DF 4+/5.   Palpation   Palpation comment Pt not able to get into prone position due to pain.  Palpated musculature in Rt sidelying.  Pt with diffuse palpable tenderness over Rt and Lt lumbar paraspinals, SI joints and deep gluteals.     Special Tests    Special Tests --  Pt with low back pain with slump and SLR test bilaterally   Bed Mobility   Bed Mobility Rolling Right;Rolling Left;Right Sidelying to Sit   Rolling Right 6: Modified independent (Device/Increase time)   Rolling Left 6: Modified independent (Device/Increase time)   Right Sidelying to Sit 6: Modified independent (Device/Increase time)   Ambulation/Gait   Ambulation/Gait Yes   Ambulation/Gait Assistance 7: Independent                   [facility] Adult PT Treatment/Exercise - 05/27/15 0001    Modalities   Modalities Electrical Stimulation;Moist Heat   Moist Heat Therapy   Number Minutes Moist Heat 15 Minutes   Moist Heat Location Lumbar Spine   Electrical Stimulation   Electrical Stimulation Location Bilateral low back and Rt gluteals   Electrical Stimulation Action IFC   Electrical Stimulation Parameters 15 minutes   Electrical Stimulation Goals Pain                  PT Short Term Goals - 05/27/15 1154    PT SHORT TERM GOAL #1   Title be independent in initial HEP   Time 4   Period Weeks   Status New   PT SHORT TERM GOAL #2   Title report a 30% reduction in  LBP and Rt LE pain at the end of a work day   Time 4   Period Weeks   Status New           PT Long Term Goals - 05/27/15 1101    PT LONG TERM GOAL #1   Title be indepenent in advanced HEP   Time 8   Period Weeks   Status New   PT LONG TERM  GOAL #2   Title reduce FOTO to < or = to 48% limitation   Time 8   Period Weeks   Status New   PT LONG TERM GOAL #3   Title report a 60% reduction in LBP and Rt LE pain at the end of a work day   Time 8   Period Weeks   Status New   PT LONG TERM GOAL #4   Title lift and carry objects at home as needed without limitation or significant increase in pain   Time 8   Period Weeks   Status New   PT LONG TERM GOAL #5   Title sit for 45 minutes without limitation   Time 8   Period Weeks   Status New               Plan - 05/27/15 1150    Clinical Impression Statement Pt presents to PT with complaints of bil. LBP and Rt LE radiculopathy that flared-up 02/2015 when lifting a heavy item at work.  Pt had lumbar fusion at L4/5 in 2014 and has had some pain since surgery. Pt demonstrates painful and slow bed mobility, pain with all hip and lumbar mobility and weak  and stiff hips bilaterally.  Pt will benfit from PT to provide pain management with possible home TENS, body mechanics, core stabilization and hip flexibility.     Pt will benefit from skilled therapeutic intervention in order to improve on the following deficits Decreased range of motion;Decreased activity tolerance;Pain;Improper body mechanics;Decreased strength;Decreased mobility   Rehab Potential Good   PT Frequency 1x / week   PT Duration 8 weeks   PT Treatment/Interventions ADLs/Self Care Home Management;Cryotherapy;Electrical Stimulation;Moist Heat;Ultrasound;Patient/family education;Functional mobility training;Neuromuscular re-education;Therapeutic exercise;Therapeutic activities;Manual techniques;Passive range of motion   PT Next Visit Plan Initiate HEP for gentle hip  flexibility, hip strength, see how pt responded to e-stim and discuss home TENs   Consulted and Agree with Plan of Care Patient;Family member/caregiver         Problem List Patient Active Problem List   Diagnosis Date Noted  . Cholelithiasis with cholecystitis 12/11/2012    Makynlee Kressin, PT 05/27/2015, 12:02 PM  [facility] [address] [place], [place], [address] Phone: [phone]   Fax:  [phone]

## 2015-06-01 ENCOUNTER — Encounter: Payer: Self-pay | Admitting: Physical Therapy

## 2015-06-01 ENCOUNTER — Ambulatory Visit: Payer: BLUE CROSS/BLUE SHIELD | Admitting: Physical Therapy

## 2015-06-01 DIAGNOSIS — M5441 Lumbago with sciatica, right side: Secondary | ICD-10-CM

## 2015-06-01 DIAGNOSIS — M25659 Stiffness of unspecified hip, not elsewhere classified: Secondary | ICD-10-CM

## 2015-06-01 DIAGNOSIS — R29898 Other symptoms and signs involving the musculoskeletal system: Secondary | ICD-10-CM

## 2015-06-01 NOTE — Therapy (Signed)
[facility] [address], [address] [place], [place], [address] Phone: [phone]   Fax:  [phone]  Physical Therapy Treatment  Patient Details  Name: Linda Cardenas MRN: [id number] Date of Birth: 02-Oct-1956 Referring Provider:  Catha Gosselin, MD  Encounter Date: 06/01/2015      PT End of Session - 06/01/15 1631    Visit Number 2   Date for PT Re-Evaluation 07/22/15   PT Start Time 1530   PT Stop Time 1636   PT Time Calculation (min) 66 min   Activity Tolerance Patient tolerated treatment well   Behavior During Therapy [facility] for tasks assessed/performed      Past Medical History  Diagnosis Date  . Back pain      CHRONIC LOW BACK PAIN - GETS INJECTIONS IN BACK  . Arthritis   . Thyroid disease   . GERD (gastroesophageal reflux disease)   . Hyperlipidemia   . Eczema   . Constipation   . Lactose intolerance   . Gallstones   . Hypothyroidism   . Elevated liver enzymes     Past Surgical History  Procedure Laterality Date  . Cesarean section    . Breast surgery      "PIMPLE" REMOVED FROM BREAST IN OFFICE  . Cholecystectomy  12/26/2012    Procedure: LAPAROSCOPIC CHOLECYSTECTOMY WITH INTRAOPERATIVE CHOLANGIOGRAM;  Surgeon: Velora Heckler, MD;  Location: [facility];  Service: General;  Laterality: N/A;  . Lumbar fusion N/A L4/5 04/2013    There were no vitals filed for this visit.  Visit Diagnosis:  No diagnosis found.      Subjective Assessment - 06/01/15 1540    Subjective Pt had only evaluation no major changes reported, but states the heat and e-stim relieved the pain.   Currently in Pain? Yes   Pain Score 6    Pain Location Back   Pain Orientation Right;Left;Lower   Pain Descriptors / Indicators Aching;Burning   Pain Type Chronic pain   Pain Onset More than a month ago   Pain Frequency Constant   Multiple Pain Sites No                         [facility] Adult PT Treatment/Exercise - 06/01/15 0001     Bed Mobility   Bed Mobility Rolling Right;Rolling Left;Right Sidelying to Sit   Rolling Right 4: Min guard   Right Sidelying to Sit 4: Min guard   Ambulation/Gait   Ambulation/Gait Yes   Ambulation/Gait Assistance --  all distance needed in gym, slow and increased hip flexion   Exercises   Exercises Lumbar   Lumbar Exercises: Stretches   Active Hamstring Stretch 3 reps;20 seconds  in sitting each leg, supine Rt leg assigned as HEP   Single Knee to Chest Stretch 3 reps;20 seconds  using towel,   Lower Trunk Rotation 3 reps;20 seconds  with bolster on each side, to address limitations   Pelvic Tilt 20 seconds  x 3, pt challenged with this due to tightness in lumbar spin   Lumbar Exercises: Aerobic   UBE (Upper Arm Bike) L0 x6 (3/3) sitting in chair   Modalities   Modalities Electrical Stimulation;Moist Heat   Moist Heat Therapy   Number Minutes Moist Heat 20 Minutes   Moist Heat Location Lumbar Spine   Electrical Stimulation   Electrical Stimulation Location Bilateral low back and Rt gluteals  needed extra towel after   Electrical Stimulation Action IFC  Electrical Stimulation Parameters 20   Electrical Stimulation Goals Pain                PT Education - 06/01/15 1620    Education provided Yes   Education Details Pelvic tilt, Hamstring stretch in supine, SKC, trunk rotation   Person(s) Educated Patient   Methods Explanation;Demonstration;Verbal cues;Handout   Comprehension Verbalized understanding;Returned demonstration          PT Short Term Goals - 05/27/15 1154    PT SHORT TERM GOAL #1   Title be independent in initial HEP   Time 4   Period Weeks   Status New   PT SHORT TERM GOAL #2   Title report a 30% reduction in LBP and Rt LE pain at the end of a work day   Time 4   Period Weeks   Status New           PT Long Term Goals - 05/27/15 1101    PT LONG TERM GOAL #1   Title be indepenent in advanced HEP   Time 8   Period Weeks    Status New   PT LONG TERM GOAL #2   Title reduce FOTO to < or = to 48% limitation   Time 8   Period Weeks   Status New   PT LONG TERM GOAL #3   Title report a 60% reduction in LBP and Rt LE pain at the end of a work day   Time 8   Period Weeks   Status New   PT LONG TERM GOAL #4   Title lift and carry objects at home as needed without limitation or significant increase in pain   Time 8   Period Weeks   Status New   PT LONG TERM GOAL #5   Title sit for 45 minutes without limitation   Time 8   Period Weeks   Status New               Plan - 06/01/15 1635    Clinical Impression Statement Pt presents with increased of hip flexion and trunk forward lean due to back pain what flared up 2016, when lifting heavy at work. History of lumbar fusion at L4/5 in 2014 and has had some pain since surgery. Pt presents with slow transfers increased time needed for tasks . Pt with palpaple tightness and fascia restrictions lumbar area. Pt will conti nue from PT to provide pain managment, body mechanics, core stabilization,    Pt will benefit from skilled therapeutic intervention in order to improve on the following deficits Decreased range of motion;Decreased activity tolerance;Pain;Improper body mechanics;Decreased strength;Decreased mobility   Rehab Potential Good   PT Frequency 1x / week   PT Duration 8 weeks   PT Treatment/Interventions ADLs/Self Care Home Management;Cryotherapy;Electrical Stimulation;Moist Heat;Ultrasound;Patient/family education;Functional mobility training;Neuromuscular re-education;Therapeutic exercise;Therapeutic activities;Manual techniques;Passive range of motion   PT Next Visit Plan Review HEP, Hamstring stretch sitting & supine, SKC, trunk rotation, UBE may sitting on ball. May add t-band strengthening exercises   Consulted and Agree with Plan of Care Patient        Problem List Patient Active Problem List   Diagnosis Date Noted  . Cholelithiasis with  cholecystitis 12/11/2012    NAUMANN-HOUEGNIFIO,Gwendola Hornaday PTA 06/01/2015, 4:45 PM  [facility] [address], [address] [place], [place], [address] Phone: [phone]   Fax:  [phone]

## 2015-06-01 NOTE — Patient Instructions (Addendum)
Copyright  VHI. All rights reserved.   Pelvic Tilt   The Clock 12 and 6 o'clock, tilt your pelvic gentle to release muscle tension Repeat 10 times per set or even more.  Do  1-2  sets per session. Do  1-2 sessions per day.  http://orth.exer.us/134   Copyright  VHI. All rights reserved. Knee to Chest (Flexion)   Pull knee toward chest. Feel stretch in lower back or buttock area. Breathing deeply, use a towel or belt to help bring the leg up. Hold 20 seconds.  Repeat  3  times.  Do 1-2  sessions per day. Repeat with other knee.  http://gt2.exer.us/225   Copyright  VHI. All rights reserved.   Lower Trunk Rotation Stretch   Keeping feet together, rotate knees to left side may have blankets or pillows under knees for support Hold 20 seconds move back to middle and then move to right side. Repeat  3 times to each side. Do  1 -2  sets per session. Do 1-2 sessions per day.  http://orth.exer.us/122   Copyright  VHI. All rights reserved.  Supine: Leg Stretch With Strap (Basic)   Lie on back with one knee bent, foot flat on floor. Hook strap around other foot. Straighten your leg feel the stretch Hold 20  seconds. Relax leg  Repeat 3   times per leg. Do 1 -2  sessions per day.  Copyright  VHI. All rights reserved.

## 2015-06-02 ENCOUNTER — Ambulatory Visit: Payer: BC Managed Care – PPO | Admitting: Physical Therapy

## 2015-06-10 ENCOUNTER — Ambulatory Visit: Payer: BLUE CROSS/BLUE SHIELD

## 2015-06-10 DIAGNOSIS — R29898 Other symptoms and signs involving the musculoskeletal system: Secondary | ICD-10-CM

## 2015-06-10 DIAGNOSIS — M5441 Lumbago with sciatica, right side: Secondary | ICD-10-CM | POA: Diagnosis not present

## 2015-06-10 DIAGNOSIS — M25659 Stiffness of unspecified hip, not elsewhere classified: Secondary | ICD-10-CM

## 2015-06-10 NOTE — Therapy (Signed)
[facility] [address] [place], [place], [address] Phone: [phone]   Fax:  [phone]  Physical Therapy Treatment  Patient Details  Name: Linda Cardenas MRN: [id number] Date of Birth: August 19, 1956 Referring Provider:  Ileana Ladd, MD  Encounter Date: 06/10/2015      PT End of Session - 06/10/15 1616    Visit Number 3   Date for PT Re-Evaluation 07/22/15   PT Start Time 1540   PT Stop Time 1626   PT Time Calculation (min) 46 min   Activity Tolerance Patient tolerated treatment well   Behavior During Therapy [facility] for tasks assessed/performed      Past Medical History  Diagnosis Date  . Back pain      CHRONIC LOW BACK PAIN - GETS INJECTIONS IN BACK  . Arthritis   . Thyroid disease   . GERD (gastroesophageal reflux disease)   . Hyperlipidemia   . Eczema   . Constipation   . Lactose intolerance   . Gallstones   . Hypothyroidism   . Elevated liver enzymes     Past Surgical History  Procedure Laterality Date  . Cesarean section    . Breast surgery      "PIMPLE" REMOVED FROM BREAST IN OFFICE  . Cholecystectomy  12/26/2012    Procedure: LAPAROSCOPIC CHOLECYSTECTOMY WITH INTRAOPERATIVE CHOLANGIOGRAM;  Surgeon: Velora Heckler, MD;  Location: [facility];  Service: General;  Laterality: N/A;  . Lumbar fusion N/A L4/5 04/2013    There were no vitals filed for this visit.  Visit Diagnosis:  Right-sided low back pain with right-sided sciatica  Hip stiffness, unspecified laterality  Weakness of both hips      Subjective Assessment - 06/10/15 1540    Subjective Low back is feeling tight today. Sometimes bends forward and feels like she cannot come back up    Patient Stated Goals reduce pain, sit longer, lift without pain, push objects. drive with Rt foot with less pain   Currently in Pain? Yes   Pain Score 4    Pain Location Back   Pain Orientation Right;Left;Anterior   Pain Descriptors / Indicators Aching    Pain Type Chronic pain   Pain Radiating Towards Rt leg down to the feet    Pain Onset More than a month ago   Pain Frequency Constant   Aggravating Factors  cooking in the kitchen, standing    Pain Relieving Factors ibuprofen                          [facility] Adult PT Treatment/Exercise - 06/10/15 0001    Lumbar Exercises: Stretches   Piriformis Stretch 2 reps;20 seconds  2x each leg   Lumbar Exercises: Aerobic   UBE (Upper Arm Bike) L0 x6 (3/3) sitting in chair  dark blue foam pads at feet    Modalities   Modalities Ultrasound   Moist Heat Therapy   Number Minutes Moist Heat 15 Minutes   Moist Heat Location Lumbar Spine   Electrical Stimulation   Electrical Stimulation Location Bilateral low back and Rt gluteals  needed extra towel after   Electrical Stimulation Action IFC   Electrical Stimulation Parameters 15   Electrical Stimulation Goals Pain   Ultrasound   Ultrasound Location Low back    Ultrasound Parameters 1.0 W/cm2; continuous    Ultrasound Goals Pain  Muscle relaxation    Manual Therapy   Manual Therapy Soft tissue mobilization  Manual therapy comments Rt. lumbar paraspinals, deep tissue/trigger point                   PT Short Term Goals - 06/10/15 1622    PT SHORT TERM GOAL #1   Title be independent in initial HEP  Reports performing stretching exercises at home    Time 4   Period Weeks   Status On-going   PT SHORT TERM GOAL #2   Title report a 30% reduction in LBP and Rt LE pain at the end of a work day   Time 4   Period Weeks   Status On-going           PT Long Term Goals - 05/27/15 1101    PT LONG TERM GOAL #1   Title be indepenent in advanced HEP   Time 8   Period Weeks   Status New   PT LONG TERM GOAL #2   Title reduce FOTO to < or = to 48% limitation   Time 8   Period Weeks   Status New   PT LONG TERM GOAL #3   Title report a 60% reduction in LBP and Rt LE pain at the end of a work day   Time 8    Period Weeks   Status New   PT LONG TERM GOAL #4   Title lift and carry objects at home as needed without limitation or significant increase in pain   Time 8   Period Weeks   Status New   PT LONG TERM GOAL #5   Title sit for 45 minutes without limitation   Time 8   Period Weeks   Status New               Plan - 06/10/15 1617    Clinical Impression Statement Continues to have extremely irritable low back pain that gets worse with position change and increased activity. Muscle tightness contributing significantly to low back pain; increased tenderness and trigger point in lumbar paraspinals. Will benefit from skilled PT for continued flexibilty, strengthening and pain management.      Pt will benefit from skilled therapeutic intervention in order to improve on the following deficits Decreased range of motion;Decreased activity tolerance;Pain;Improper body mechanics;Decreased strength;Decreased mobility;Impaired flexibility   Rehab Potential Good   PT Frequency 1x / week   PT Duration 8 weeks   PT Treatment/Interventions ADLs/Self Care Home Management;Cryotherapy;Electrical Stimulation;Moist Heat;Ultrasound;Patient/family education;Functional mobility training;Neuromuscular re-education;Therapeutic exercise;Therapeutic activities;Manual techniques;Passive range of motion   PT Next Visit Plan Try sitting on the ball at UBE, postural strenghtening exercises, pelvic tilt in sitting    Consulted and Agree with Plan of Care Patient        Problem List Patient Active Problem List   Diagnosis Date Noted  . Cholelithiasis with cholecystitis 12/11/2012   Reyes Ivan, SPT 06/10/2015 4:32 PM   During this treatment session, the therapist was present, participating in, and directing the treatment. TAKACS,KELLY, PT 06/10/2015, 4:32 PM  [place] [facility] [address], [address] [place], [place], [address] Phone: [phone]   Fax:   [phone]

## 2015-06-29 ENCOUNTER — Ambulatory Visit: Payer: BLUE CROSS/BLUE SHIELD | Attending: Family Medicine | Admitting: Physical Therapy

## 2015-06-29 ENCOUNTER — Encounter: Payer: Self-pay | Admitting: Physical Therapy

## 2015-06-29 DIAGNOSIS — R29898 Other symptoms and signs involving the musculoskeletal system: Secondary | ICD-10-CM

## 2015-06-29 DIAGNOSIS — M25659 Stiffness of unspecified hip, not elsewhere classified: Secondary | ICD-10-CM | POA: Diagnosis present

## 2015-06-29 DIAGNOSIS — M5441 Lumbago with sciatica, right side: Secondary | ICD-10-CM | POA: Diagnosis present

## 2015-06-29 DIAGNOSIS — M6289 Other specified disorders of muscle: Secondary | ICD-10-CM | POA: Diagnosis present

## 2015-06-29 NOTE — Therapy (Signed)
[facility] [address] [place], [place], [address] Phone: [phone]   Fax:  [phone]  Physical Therapy Treatment  Patient Details  Name: Linda Cardenas MRN: [id number] Date of Birth: 02-10-1956 Referring Provider:  Ileana Ladd, MD  Encounter Date: 06/29/2015      PT End of Session - 06/29/15 1724    Visit Number 4   Date for PT Re-Evaluation 07/22/15   PT Start Time 1533   PT Stop Time 1631   PT Time Calculation (min) 58 min   Activity Tolerance Patient tolerated treatment well   Behavior During Therapy [facility] for tasks assessed/performed      Past Medical History  Diagnosis Date  . Back pain      CHRONIC LOW BACK PAIN - GETS INJECTIONS IN BACK  . Arthritis   . Thyroid disease   . GERD (gastroesophageal reflux disease)   . Hyperlipidemia   . Eczema   . Constipation   . Lactose intolerance   . Gallstones   . Hypothyroidism   . Elevated liver enzymes     Past Surgical History  Procedure Laterality Date  . Cesarean section    . Breast surgery      "PIMPLE" REMOVED FROM BREAST IN OFFICE  . Cholecystectomy  12/26/2012    Procedure: LAPAROSCOPIC CHOLECYSTECTOMY WITH INTRAOPERATIVE CHOLANGIOGRAM;  Surgeon: Velora Heckler, MD;  Location: [facility];  Service: General;  Laterality: N/A;  . Lumbar fusion N/A L4/5 04/2013    There were no vitals filed for this visit.  Visit Diagnosis:  Right-sided low back pain with right-sided sciatica  Hip stiffness, unspecified laterality  Weakness of both hips      Subjective Assessment - 06/29/15 1618    Subjective Pt reports she noties improvement in low back, but still difficult with coming out of flexion, feeling she can not come back up   Currently in Pain? Yes   Pain Score 2   premedicated   Pain Location Back   Pain Orientation Right;Left;Anterior   Pain Descriptors / Indicators Aching   Pain Type Chronic pain   Pain Onset More than a month ago   Pain  Frequency Constant   Multiple Pain Sites No                         [facility] Adult PT Treatment/Exercise - 06/29/15 0001    Bed Mobility   Bed Mobility Rolling Right;Rolling Left;Right Sidelying to Sit   Rolling Right 7: Independent;Other (comment)  incr time needed, and vc's for technique   Right Sidelying to Sit 7: Independent;Other (comment)  verbal cues for technique, slow pace   Exercises   Exercises Lumbar   Lumbar Exercises: Stretches   Active Hamstring Stretch 3 reps;20 seconds  in supine with strap   Single Knee to Chest Stretch 3 reps;20 seconds  using hands   Lower Trunk Rotation 3 reps;20 seconds  no bolster needed today   Piriformis Stretch 3 reps;20 seconds  in sitting each leg   Lumbar Exercises: Aerobic   UBE (Upper Arm Bike) L1 x6 (3/3) sitting in chair   Moist Heat Therapy   Number Minutes Moist Heat 15 Minutes   Moist Heat Location Lumbar Spine   Electrical Stimulation   Electrical Stimulation Location Bilateral low back and Rt gluteals   Electrical Stimulation Action IFC   Electrical Stimulation Parameters 15   Electrical Stimulation Goals Pain   Manual Therapy   Manual  Therapy Soft tissue mobilization   Manual therapy comments Rt. lumbar paraspinals, in left sidelying                   PT Short Term Goals - 06/29/15 1623    PT SHORT TERM GOAL #1   Title be independent in initial HEP   Time 4   Period Weeks   Status On-going   PT SHORT TERM GOAL #2   Title report a 30% reduction in LBP and Rt LE pain at the end of a work day   Time 4   Period Weeks   Status On-going           PT Long Term Goals - 06/29/15 1623    PT LONG TERM GOAL #1   Title be indepenent in advanced HEP   Time 8   Period Weeks   Status On-going   PT LONG TERM GOAL #2   Title reduce FOTO to < or = to 48% limitation   Time 8   Period Weeks   Status On-going   PT LONG TERM GOAL #3   Title report a 60% reduction in LBP and Rt LE pain at the  end of a work day   Time 8   Period Weeks   Status On-going   PT LONG TERM GOAL #4   Title lift and carry objects at home as needed without limitation or significant increase in pain   Time 8   Period Weeks   Status On-going   PT LONG TERM GOAL #5   Title sit for 45 minutes without limitation   Time 8   Period Weeks   Status On-going               Plan - 06/29/15 1725    Clinical Impression Statement Pt presents with improved mobility in clinic as noticed with ambulation and transfers. however, still very limited due to softtissue restriction along scar and muscle tightness low back area. Pt will continue to benefit from skilled PT to address those  areas   Pt will benefit from skilled therapeutic intervention in order to improve on the following deficits Decreased range of motion;Decreased activity tolerance;Pain;Improper body mechanics;Decreased strength;Decreased mobility;Impaired flexibility   Rehab Potential Good   PT Frequency 1x / week   PT Duration 8 weeks   PT Treatment/Interventions ADLs/Self Care Home Management;Cryotherapy;Electrical Stimulation;Moist Heat;Ultrasound;Patient/family education;Functional mobility training;Neuromuscular re-education;Therapeutic exercise;Therapeutic activities;Manual techniques;Passive range of motion   PT Next Visit Plan Try sitting on the ball at UBE, postural strenghtening exercises, pelvic tilt in sitting, soft tissue work to low back    Consulted and Agree with Plan of Care Patient        Problem List Patient Active Problem List   Diagnosis Date Noted  . Cholelithiasis with cholecystitis 12/11/2012    NAUMANN-HOUEGNIFIO,Byanca Kasper PTA 06/29/2015, 5:32 PM  [place] [facility] [address] [place], [place], [address] Phone: [phone]   Fax:  [phone]

## 2015-07-06 ENCOUNTER — Encounter: Payer: Self-pay | Admitting: Physical Therapy

## 2015-07-06 ENCOUNTER — Ambulatory Visit: Payer: BLUE CROSS/BLUE SHIELD | Admitting: Physical Therapy

## 2015-07-06 DIAGNOSIS — M5441 Lumbago with sciatica, right side: Secondary | ICD-10-CM | POA: Diagnosis not present

## 2015-07-06 DIAGNOSIS — R29898 Other symptoms and signs involving the musculoskeletal system: Secondary | ICD-10-CM

## 2015-07-06 DIAGNOSIS — M25659 Stiffness of unspecified hip, not elsewhere classified: Secondary | ICD-10-CM

## 2015-07-06 NOTE — Therapy (Signed)
[facility] [address], [address] [place], [place], [address] Phone: [phone]   Fax:  [phone]  Physical Therapy Treatment  Patient Details  Name: Linda Cardenas MRN: [id number] Date of Birth: 11/14/1956 Referring Provider:  Ileana Ladd, MD  Encounter Date: 07/06/2015      PT End of Session - 07/06/15 1718    Visit Number 5   Date for PT Re-Evaluation 07/22/15   PT Start Time 1618   PT Stop Time 1701   PT Time Calculation (min) 43 min   Activity Tolerance Patient tolerated treatment well   Behavior During Therapy [facility] for tasks assessed/performed      Past Medical History  Diagnosis Date  . Back pain      CHRONIC LOW BACK PAIN - GETS INJECTIONS IN BACK  . Arthritis   . Thyroid disease   . GERD (gastroesophageal reflux disease)   . Hyperlipidemia   . Eczema   . Constipation   . Lactose intolerance   . Gallstones   . Hypothyroidism   . Elevated liver enzymes     Past Surgical History  Procedure Laterality Date  . Cesarean section    . Breast surgery      "PIMPLE" REMOVED FROM BREAST IN OFFICE  . Cholecystectomy  12/26/2012    Procedure: LAPAROSCOPIC CHOLECYSTECTOMY WITH INTRAOPERATIVE CHOLANGIOGRAM;  Surgeon: Velora Heckler, MD;  Location: [facility];  Service: General;  Laterality: N/A;  . Lumbar fusion N/A L4/5 04/2013    There were no vitals filed for this visit.  Visit Diagnosis:  Right-sided low back pain with right-sided sciatica  Hip stiffness, unspecified laterality  Weakness of both hips      Subjective Assessment - 07/06/15 1709    Subjective Pt has MD appointment on 07/13/15. Pt reports at times feeling decreased control in Right LE wihat cuases fear of falling. All over at end of work day pain in  low back rated as 4/10    Pertinent History L4/L5 fusion 2014   Currently in Pain? Yes   Pain Score 4    Pain Location Back   Pain Orientation Right;Left;Anterior   Pain Descriptors /  Indicators Aching   Pain Type Chronic pain   Pain Onset More than a month ago   Pain Frequency Constant   Multiple Pain Sites No            [facility] PT Assessment - 07/06/15 0001    Posture/Postural Control   Posture/Postural Control Postural limitations   Postural Limitations Forward head;Rounded Shoulders;Increased thoracic kyphosis   ROM / Strength   AROM / PROM / Strength AROM   AROM   Overall AROM  Deficits   Overall AROM Comments Lumbar AROM is limited by 25% in all directions and with pain at end range.     PROM   Overall PROM  Deficits   Overall PROM Comments Bilateral hip flexibility is limited by 50-75% throughout with pain in the lumbar spine reported with all PROM testing.   Strength   Overall Strength Deficits   Overall Strength Comments 4/5 bilateral hip flexors, knees 4+/5, ankle DF 4+/5.   Palpation   Palpation comment Pt able for prone position, palpaple tightness and TP to right L4/5   Bed Mobility   Rolling Right 7: Independent;Other (comment)  with slow motion due to pain in low back, out of prone posit   Ambulation/Gait   Ambulation/Gait Yes   Ambulation/Gait Assistance 7: Independent  velocity improved since start  of PT                     [facility] Adult PT Treatment/Exercise - 07/06/15 0001    Bed Mobility   Bed Mobility --  from prone position to edge of table into standing   Lumbar Exercises: Aerobic   UBE (Upper Arm Bike) L1 x6 (3/3) sitting in chair   Lumbar Exercises: Standing   Other Standing Lumbar Exercises Pt educated in using her personal home TENS, and practiced set up, pt with good understanding and electrodes placed on back and pt left with TENS set up   Manual Therapy   Manual Therapy Soft tissue mobilization  and myofascial release to low back   Manual therapy comments in prone position                  PT Short Term Goals - 07/06/15 1723    PT SHORT TERM GOAL #1   Title be independent in initial HEP   Time  4   Period Weeks   Status On-going   PT SHORT TERM GOAL #2   Title report a 30% reduction in LBP and Rt LE pain at the end of a work day   Time 4   Period Weeks   Status On-going           PT Long Term Goals - 07/06/15 1723    PT LONG TERM GOAL #1   Title be indepenent in advanced HEP   Time 8   Period Weeks   Status On-going   PT LONG TERM GOAL #2   Title reduce FOTO to < or = to 48% limitation   Time 8   Period Weeks   Status On-going   PT LONG TERM GOAL #3   Title report a 60% reduction in LBP and Rt LE pain at the end of a work day   Time 8   Period Weeks   Status On-going   PT LONG TERM GOAL #4   Title lift and carry objects at home as needed without limitation or significant increase in pain   Time 8   Period Weeks   Status On-going   PT LONG TERM GOAL #5   Title sit for 45 minutes without limitation   Time 8   Period Weeks   Status On-going               Plan - 07/06/15 1719    Clinical Impression Statement Pt presents with decreased guarding and improved mobility with ambulation and transfers. however, still very limited due to pain and softtisue restrictions along scar and muscle tightness in low back area. Pt will continue to benefit from skilled PT to address those areas.   Pt will benefit from skilled therapeutic intervention in order to improve on the following deficits Decreased range of motion;Decreased activity tolerance;Pain;Improper body mechanics;Decreased strength;Decreased mobility;Impaired flexibility   Rehab Potential Good   PT Frequency 1x / week   PT Duration 8 weeks   PT Treatment/Interventions ADLs/Self Care Home Management;Cryotherapy;Electrical Stimulation;Moist Heat;Ultrasound;Patient/family education;Functional mobility training;Neuromuscular re-education;Therapeutic exercise;Therapeutic activities;Manual techniques;Passive range of motion   PT Next Visit Plan Tty sitting on ball at UBE, postural strengthening exercises, pelvic  tilt in sitting, softtissue work to low back in sidelying.    Consulted and Agree with Plan of Care Patient        Problem List Patient Active Problem List   Diagnosis Date Noted  . Cholelithiasis with cholecystitis 12/11/2012  NAUMANN-HOUEGNIFIO,Diontre Harps PTA 07/06/2015, 5:50 PM   [facility] [address], [address] [place], [place], [address] Phone: [phone]   Fax:  [phone]

## 2015-07-13 ENCOUNTER — Telehealth: Payer: Self-pay

## 2015-07-13 ENCOUNTER — Ambulatory Visit: Payer: BLUE CROSS/BLUE SHIELD

## 2015-07-13 NOTE — Telephone Encounter (Signed)
Left message and told pt to call back secondary to missed appt.

## 2015-07-20 ENCOUNTER — Encounter: Payer: Self-pay | Admitting: Physical Therapy

## 2015-07-20 ENCOUNTER — Ambulatory Visit: Payer: BLUE CROSS/BLUE SHIELD | Admitting: Physical Therapy

## 2015-07-20 DIAGNOSIS — M25659 Stiffness of unspecified hip, not elsewhere classified: Secondary | ICD-10-CM

## 2015-07-20 DIAGNOSIS — M5441 Lumbago with sciatica, right side: Secondary | ICD-10-CM

## 2015-07-20 DIAGNOSIS — R29898 Other symptoms and signs involving the musculoskeletal system: Secondary | ICD-10-CM

## 2015-07-20 NOTE — Therapy (Signed)
[facility] [address] [place], [place], [address] Phone: [phone]   Fax:  [phone]  Physical Therapy Treatment  Patient Details  Name: Linda Cardenas MRN: [id number] Date of Birth: 1956-11-19 Referring Provider:  Ileana Ladd, MD  Encounter Date: 07/20/2015      PT End of Session - 07/20/15 1548    Visit Number 6   Date for PT Re-Evaluation 09/03/15   PT Start Time 1534   PT Stop Time 1637   PT Time Calculation (min) 63 min   Activity Tolerance Patient tolerated treatment well   Behavior During Therapy [facility] for tasks assessed/performed      Past Medical History  Diagnosis Date  . Back pain      CHRONIC LOW BACK PAIN - GETS INJECTIONS IN BACK  . Arthritis   . Thyroid disease   . GERD (gastroesophageal reflux disease)   . Hyperlipidemia   . Eczema   . Constipation   . Lactose intolerance   . Gallstones   . Hypothyroidism   . Elevated liver enzymes     Past Surgical History  Procedure Laterality Date  . Cesarean section    . Breast surgery      "PIMPLE" REMOVED FROM BREAST IN OFFICE  . Cholecystectomy  12/26/2012    Procedure: LAPAROSCOPIC CHOLECYSTECTOMY WITH INTRAOPERATIVE CHOLANGIOGRAM;  Surgeon: Velora Heckler, MD;  Location: [facility];  Service: General;  Laterality: N/A;  . Lumbar fusion N/A L4/5 04/2013    There were no vitals filed for this visit.  Visit Diagnosis:  Right-sided low back pain with right-sided sciatica  Hip stiffness, unspecified laterality  Weakness of both hips      Subjective Assessment - 07/20/15 1545    Subjective Per pt reports MD recommends to continue with PT and soft tissue work to help release muscle tension. Pt reports some day's the pain is very bad, but notices all over improvement rated as 30%   Currently in Pain? No/denies  no pain but it feels heavy, in low back, and difficult to turn   Pain Location Back   Pain Orientation Right;Left;Mid   Pain  Descriptors / Indicators Heaviness;Squeezing   Pain Type Chronic pain   Pain Onset More than a month ago   Pain Frequency Constant   Multiple Pain Sites No            [facility] PT Assessment - 07/20/15 0001    Assessment   Medical Diagnosis Lumbar radiculopathy (M54.16) Chronic back pain s/p lumbar fusion   Onset Date/Surgical Date 04/26/13  surgery 2014   Precautions   Precautions None   Restrictions   Weight Bearing Restrictions No   Balance Screen   Has the patient fallen in the past 6 months No   Has the patient had a decrease in activity level because of a fear of falling?  No   Is the patient reluctant to leave their home because of a fear of falling?  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     PROM   Overall PROM  Deficits   Overall PROM Comments PROM Rt hip flex pain in low back, left side full no pain   Strength   Overall Strength Deficits   Overall Strength Comments 4/5 bilateral hip flexors, knees 4+/5, ankle DF 4+/5.   Palpation   Palpation comment as of 8/16 pt able for prone position, palpaple tightness and TP to right L4/5   Bed Mobility   Bed Mobility Supine to Sit   supine to Rt sidelying to sitting   Rolling Right 7: Independent;Other (comment)  with slow motion due to pain   Right Sidelying to Sit 7: Independent;Other (comment)   Ambulation/Gait   Ambulation/Gait Yes   Ambulation/Gait Assistance 7: Independent  gait velocity improved since start of PT                     [facility] Adult PT Treatment/Exercise - 07/20/15 0001    Exercises   Exercises Lumbar   Lumbar Exercises: Stretches   Active Hamstring Stretch 3 reps;20 seconds   Single Knee to Chest Stretch 3 reps;20 seconds  using towel   Lower Trunk Rotation --   Lumbar Exercises: Aerobic   UBE (Upper Arm Bike) L1 x6 (3/3) sitting in chair   Lumbar Exercises: Seated   Other Seated Lumbar Exercises yellow t-band into bil abduction for back strengthening  2x10   Modalities   Modalities Electrical Stimulation   Moist Heat Therapy   Number Minutes Moist Heat 20 Minutes   Moist Heat Location Lumbar Spine   Electrical Stimulation   Electrical Stimulation Location Bilateral low back and Rt gluteals   Electrical Stimulation Action IFC   Electrical Stimulation Parameters 20   Electrical Stimulation Goals Tone                  PT Short Term Goals - 07/20/15 1555    PT SHORT TERM GOAL #1   Title be independent in initial HEP   Time 4   Period Weeks   Status Achieved   PT SHORT TERM GOAL #2   Title report a 30% reduction in LBP and Rt LE pain at the end of a work day   Time 4   Period Weeks   Status Achieved           PT Long Term Goals - 07/20/15 1611    PT LONG TERM GOAL #1   Title be indepenent in advanced HEP   Time 6   Period Weeks   Status On-going   PT LONG TERM GOAL #2   Title reduce FOTO to < or = to 48% limitation  51% limitations   Time 6   Period Weeks   Status On-going   PT LONG TERM GOAL #3   Title report a 60% reduction in LBP and Rt LE pain at the end of a work day  30% as of 07/20/15   Time 6   Period Weeks   Status On-going    PT LONG TERM GOAL #4   Title lift and carry objects at home as needed without limitation  or significant increase in pain   Time 6   Status On-going   PT LONG TERM GOAL #5   Title sit for 45 minutes without limitation   Time 6   Period Weeks   Status On-going               Plan - 07/20/15 1548    Clinical Impression Statement Pt able to sit on physioball while using UBE. Pt with improved mobility with ambulation and transfers. Palpaple tighntess along scar and muscle tightness. Pt will continue to benefit from skilled Pt to address pain and flexibility.   Pt will benefit from skilled therapeutic intervention in order to improve on the following deficits Decreased range of motion;Decreased activity tolerance;Pain;Improper body mechanics;Decreased strength;Decreased mobility;Impaired flexibility   Rehab Potential Good   PT Frequency 2x / week   PT Duration 6 weeks   PT Treatment/Interventions ADLs/Self Care Home Management;Cryotherapy;Electrical Stimulation;Moist Heat;Ultrasound;Patient/family education;Functional mobility training;Neuromuscular re-education;Therapeutic exercise;Therapeutic activities;Manual techniques;Passive range of motion   PT Next Visit Plan Continue sitting on red physioball on UBE, lumbar stretching and soft tissue work to low back in sidelying   Consulted and Agree with Plan of Care Patient        Problem List Patient Active Problem List   Diagnosis Date Noted  . Cholelithiasis with cholecystitis 12/11/2012    Melton Krebs, PTA 07/20/2015 5:44 PM  [facility] [address] [place], [place], [address] Phone: [phone]   Fax:  [phone]

## 2015-07-27 ENCOUNTER — Ambulatory Visit: Payer: BLUE CROSS/BLUE SHIELD | Attending: Family Medicine | Admitting: Rehabilitation

## 2015-07-27 DIAGNOSIS — M6289 Other specified disorders of muscle: Secondary | ICD-10-CM | POA: Insufficient documentation

## 2015-07-27 DIAGNOSIS — M5441 Lumbago with sciatica, right side: Secondary | ICD-10-CM | POA: Insufficient documentation

## 2015-07-27 DIAGNOSIS — M25659 Stiffness of unspecified hip, not elsewhere classified: Secondary | ICD-10-CM | POA: Insufficient documentation

## 2015-07-27 NOTE — Therapy (Signed)
[facility] [address] [place], [place], [address] Phone: [phone]   Fax:  [phone]  Physical Therapy Treatment  Patient Details  Name: Linda Cardenas MRN: [id number] Date of Birth: 10-22-1956 Referring Provider:  Catha Gosselin, MD  Encounter Date: 07/27/2015      PT End of Session - 07/27/15 1612    Visit Number 7   Date for PT Re-Evaluation 09/03/15   PT Start Time 1530   PT Stop Time 1625   PT Time Calculation (min) 55 min   Activity Tolerance Patient tolerated treatment well      Past Medical History  Diagnosis Date  . Back pain      CHRONIC LOW BACK PAIN - GETS INJECTIONS IN BACK  . Arthritis   . Thyroid disease   . GERD (gastroesophageal reflux disease)   . Hyperlipidemia   . Eczema   . Constipation   . Lactose intolerance   . Gallstones   . Hypothyroidism   . Elevated liver enzymes     Past Surgical History  Procedure Laterality Date  . Cesarean section    . Breast surgery      "PIMPLE" REMOVED FROM BREAST IN OFFICE  . Cholecystectomy  12/26/2012    Procedure: LAPAROSCOPIC CHOLECYSTECTOMY WITH INTRAOPERATIVE CHOLANGIOGRAM;  Surgeon: Velora Heckler, MD;  Location: [facility];  Service: General;  Laterality: N/A;  . Lumbar fusion N/A L4/5 04/2013    There were no vitals filed for this visit.  Visit Diagnosis:  Right-sided low back pain with right-sided sciatica      Subjective Assessment - 07/27/15 1531    Subjective no pain but tight   Currently in Pain? No/denies                         [facility] Adult PT Treatment/Exercise - 07/27/15 0001    Exercises   Exercises Other Exercises   Lumbar Exercises: Stretches   Active Hamstring Stretch 3 reps;20 seconds   Single Knee to Chest Stretch 3 reps;20 seconds  using green strap   Lower Trunk Rotation 5 reps;10 seconds   Lumbar Exercises: Aerobic   UBE (Upper Arm Bike) L1 x6 (3/3) sitting on red ball)   Lumbar Exercises:  Seated   Other Seated Lumbar Exercises yellow t-band into bil abduction for back strengthening  on red ball   Lumbar Exercises: Supine   Ab Set 5 reps  10 seconds   AB Set Limitations minimal TrA activation available   Bent Knee Raise --  3 reps bilateral with trA activation x 2   Moist Heat Therapy   Number Minutes Moist Heat 15 Minutes   Moist Heat Location Lumbar Spine   Manual Therapy   Manual Therapy Soft tissue mobilization  bil QL and lumbar paraspinals R>L   Manual therapy comments in prone position                  PT Short Term Goals - 07/20/15 1555    PT SHORT TERM GOAL #1   Title be independent in initial HEP   Time 4   Period Weeks   Status Achieved   PT SHORT TERM GOAL #2   Title report a 30% reduction in LBP and Rt LE pain at the end of a work day   Time 4   Period Weeks   Status Achieved           PT Long Term Goals - 07/20/15  1611    PT LONG TERM GOAL #1   Title be indepenent in advanced HEP   Time 6   Period Weeks   Status On-going   PT LONG TERM GOAL #2   Title reduce FOTO to < or = to 48% limitation  51% limitations   Time 6   Period Weeks   Status On-going   PT LONG TERM GOAL #3   Title report a 60% reduction in LBP and Rt LE pain at the end of a work day  30% as of 07/20/15   Time 6   Period Weeks   Status On-going   PT LONG TERM GOAL #4   Title lift and carry objects at home as needed without limitation or significant increase in pain   Time 6   Status On-going   PT LONG TERM GOAL #5   Title sit for 45 minutes without limitation   Time 6   Period Weeks   Status On-going               Plan - 07/27/15 1613    Clinical Impression Statement tolerated all well.  very limited ability to activate trA in supine.  tender to palpation and increased tone to R lumbar paraspinals today.    PT Next Visit Plan Continue sitting on red physioball on UBE, lumbar stretching and soft tissue work to low back in sidelying         Problem List Patient Active Problem List   Diagnosis Date Noted  . Cholelithiasis with cholecystitis 12/11/2012    Idamae Lusher, DPT, CMP 07/27/2015, 4:15 PM  [facility] [address] [place], [place], [address] Phone: [phone]   Fax:  [phone]

## 2015-08-03 ENCOUNTER — Encounter: Payer: BLUE CROSS/BLUE SHIELD | Admitting: Physical Therapy

## 2015-08-05 ENCOUNTER — Encounter: Payer: Self-pay | Admitting: Physical Therapy

## 2015-08-05 ENCOUNTER — Ambulatory Visit: Payer: BLUE CROSS/BLUE SHIELD | Admitting: Physical Therapy

## 2015-08-05 DIAGNOSIS — M25659 Stiffness of unspecified hip, not elsewhere classified: Secondary | ICD-10-CM

## 2015-08-05 DIAGNOSIS — M5441 Lumbago with sciatica, right side: Secondary | ICD-10-CM

## 2015-08-05 DIAGNOSIS — R29898 Other symptoms and signs involving the musculoskeletal system: Secondary | ICD-10-CM

## 2015-08-05 NOTE — Therapy (Signed)
[facility] [address] [place], [place], [address] Phone: [phone]   Fax:  [phone]  Physical Therapy Treatment  Patient Details  Name: Linda Cardenas MRN: [id number] Date of Birth: [birth year] Referring Provider:  Catha Gosselin, MD  Encounter Date: 08/05/2015      PT End of Session - 08/05/15 1553    Visit Number 8   Date for PT Re-Evaluation 09/03/15   PT Start Time 1537   PT Stop Time 1634   PT Time Calculation (min) 57 min   Activity Tolerance Patient tolerated treatment well   Behavior During Therapy [facility] for tasks assessed/performed      Past Medical History  Diagnosis Date  . Back pain      CHRONIC LOW BACK PAIN - GETS INJECTIONS IN BACK  . Arthritis   . Thyroid disease   . GERD (gastroesophageal reflux disease)   . Hyperlipidemia   . Eczema   . Constipation   . Lactose intolerance   . Gallstones   . Hypothyroidism   . Elevated liver enzymes     Past Surgical History  Procedure Laterality Date  . Cesarean section    . Breast surgery      "PIMPLE" REMOVED FROM BREAST IN OFFICE  . Cholecystectomy  12/26/2012    Procedure: LAPAROSCOPIC CHOLECYSTECTOMY WITH INTRAOPERATIVE CHOLANGIOGRAM;  Surgeon: Velora Heckler, MD;  Location: [facility];  Service: General;  Laterality: N/A;  . Lumbar fusion N/A L4/5 04/2013    There were no vitals filed for this visit.  Visit Diagnosis:  Right-sided low back pain with right-sided sciatica  Weakness of both hips  Hip stiffness, unspecified laterality      Subjective Assessment - 08/05/15 1543    Subjective Today pain in low back is rated as 6/10, she is walking with increased trunk flexion due to discomfort. After PT session pt with improved psoture and less pain.   Currently in Pain? Yes   Pain Score 6    Pain Location Back   Pain Orientation Right;Left;Mid   Pain Descriptors / Indicators Heaviness;Squeezing   Pain Type Chronic pain   Pain Onset More  than a month ago   Pain Frequency Constant   Multiple Pain Sites No                         [facility] Adult PT Treatment/Exercise - 08/05/15 0001    Posture/Postural Control   Posture/Postural Control Postural limitations   Postural Limitations Forward head;Rounded Shoulders;Increased thoracic kyphosis   Exercises   Exercises Lumbar   Lumbar Exercises: Stretches   Active Hamstring Stretch 3 reps;20 seconds   Single Knee to Chest Stretch 3 reps;20 seconds  using green strap   Lower Trunk Rotation 5 reps;10 seconds   Lumbar Exercises: Aerobic   UBE (Upper Arm Bike) L1 x6 (3/3) sitting on red ball)   Moist Heat Therapy   Number Minutes Moist Heat 15 Minutes   Moist Heat Location Lumbar Spine   Electrical Stimulation   Electrical Stimulation Location Bilateral low back and Rt gluteals   Electrical Stimulation Action IFC   Electrical Stimulation Parameters 20   Electrical Stimulation Goals Tone   Manual Therapy   Manual Therapy Soft tissue mobilization   Manual therapy comments in left sidelying to bil lumbar paraspinals and QL on Rt side                  PT Short Term Goals -  08/05/15 1555    PT SHORT TERM GOAL #1   Title be independent in initial HEP   Time 4   Period Weeks   Status Achieved   PT SHORT TERM GOAL #2   Title report a 30% reduction in LBP and Rt LE pain at the end of a work day   Time 4   Period Weeks   Status Achieved           PT Long Term Goals - 08/05/15 1601    PT LONG TERM GOAL #1   Title be indepenent in advanced HEP   Time 6   Period Weeks   Status On-going   PT LONG TERM GOAL #2   Title reduce FOTO to < or = to 48% limitation   Time 6   Period Weeks   Status On-going   PT LONG TERM GOAL #3   Time 6   Period Weeks   Status On-going   PT LONG TERM GOAL #4   Title lift and carry objects at home as needed without limitation or significant increase in pain   Time 6   Period Weeks   Status On-going   PT LONG  TERM GOAL #5   Title sit for 45 minutes without limitation   Time 6   Period Weeks   Status On-going               Plan - 08/05/15 1553    Clinical Impression Statement Started in supine with stretching while on heating pad, due to incr pain today. Then abel to tolerate UBE sitting on physioball.   Pt will benefit from skilled therapeutic intervention in order to improve on the following deficits Decreased range of motion;Decreased activity tolerance;Pain;Improper body mechanics;Decreased strength;Decreased mobility;Impaired flexibility   Rehab Potential Good   PT Frequency 2x / week   PT Duration 6 weeks   PT Treatment/Interventions ADLs/Self Care Home Management;Cryotherapy;Electrical Stimulation;Moist Heat;Ultrasound;Patient/family education;Functional mobility training;Neuromuscular re-education;Therapeutic exercise;Therapeutic activities;Manual techniques;Passive range of motion   PT Next Visit Plan Continue sitting on red physioball on UBE, lumbar stretching and soft tissue work to low back in sidelying   Consulted and Agree with Plan of Care Patient        Problem List Patient Active Problem List   Diagnosis Date Noted  . Cholelithiasis with cholecystitis 12/11/2012    NAUMANN-HOUEGNIFIO,Loucinda Croy PTA 08/05/2015, 5:04 PM  [facility] [address], [address] [place], [place], [address] Phone: [phone]   Fax:  [phone]

## 2015-08-09 ENCOUNTER — Ambulatory Visit: Payer: BLUE CROSS/BLUE SHIELD | Admitting: Physical Therapy

## 2015-08-09 ENCOUNTER — Encounter: Payer: Self-pay | Admitting: Physical Therapy

## 2015-08-09 DIAGNOSIS — M5441 Lumbago with sciatica, right side: Secondary | ICD-10-CM

## 2015-08-09 DIAGNOSIS — R29898 Other symptoms and signs involving the musculoskeletal system: Secondary | ICD-10-CM

## 2015-08-09 DIAGNOSIS — M25659 Stiffness of unspecified hip, not elsewhere classified: Secondary | ICD-10-CM

## 2015-08-09 NOTE — Therapy (Signed)
[facility] [address], [address] [place], [place], [address] Phone: [phone]   Fax:  [phone]  Physical Therapy Treatment  Patient Details  Name: Linda Cardenas MRN: [id number] Date of Birth: 1956-08-31 Referring Provider:  Ileana Ladd, MD  Encounter Date: 08/09/2015      PT End of Session - 08/09/15 1549    Visit Number 9   Date for PT Re-Evaluation 09/03/15   PT Start Time 1532   PT Stop Time 1636   PT Time Calculation (min) 64 min   Activity Tolerance Patient tolerated treatment well   Behavior During Therapy [facility] for tasks assessed/performed      Past Medical History  Diagnosis Date  . Back pain      CHRONIC LOW BACK PAIN - GETS INJECTIONS IN BACK  . Arthritis   . Thyroid disease   . GERD (gastroesophageal reflux disease)   . Hyperlipidemia   . Eczema   . Constipation   . Lactose intolerance   . Gallstones   . Hypothyroidism   . Elevated liver enzymes     Past Surgical History  Procedure Laterality Date  . Cesarean section    . Breast surgery      "PIMPLE" REMOVED FROM BREAST IN OFFICE  . Cholecystectomy  12/26/2012    Procedure: LAPAROSCOPIC CHOLECYSTECTOMY WITH INTRAOPERATIVE CHOLANGIOGRAM;  Surgeon: Velora Heckler, MD;  Location: [facility];  Service: General;  Laterality: N/A;  . Lumbar fusion N/A L4/5 04/2013    There were no vitals filed for this visit.  Visit Diagnosis:  Right-sided low back pain with right-sided sciatica  Weakness of both hips  Hip stiffness, unspecified laterality      Subjective Assessment - 08/09/15 1538    Subjective No complain of pain this pm, but this morning my back was very stiff.    Patient is accompained by: Family member   Currently in Pain? No/denies                         [facility] Adult PT Treatment/Exercise - 08/09/15 0001    Posture/Postural Control   Posture/Postural Control Postural limitations   Postural Limitations Forward  head;Rounded Shoulders;Increased thoracic kyphosis   Exercises   Exercises Lumbar   Lumbar Exercises: Stretches   Active Hamstring Stretch 3 reps;20 seconds   Active Hamstring Stretch Limitations Neural stretch each leg 2 x 10 DF with A by PTA for leg   Single Knee to Chest Stretch 3 reps;20 seconds   Lower Trunk Rotation 5 reps;10 seconds   Lumbar Exercises: Aerobic   UBE (Upper Arm Bike) L1 x6 (3/3) sitting on red ball)   Lumbar Exercises: Seated   Other Seated Lumbar Exercises yellow t-band into bil abduction for back strengthening   Lumbar Exercises: Supine   Ab Set 10 reps;5 seconds   Moist Heat Therapy   Number Minutes Moist Heat 20 Minutes   Moist Heat Location Lumbar Spine   Electrical Stimulation   Electrical Stimulation Location Bilateral low back and Rt gluteals   Electrical Stimulation Action IFC   Electrical Stimulation Parameters 20   Electrical Stimulation Goals Tone   Manual Therapy   Manual Therapy Soft tissue mobilization   Manual therapy comments in right sidelying to bil lumbar paraspinals and QL on left side                  PT Short Term Goals - 08/05/15 1555    PT SHORT  TERM GOAL #1   Title be independent in initial HEP   Time 4   Period Weeks   Status Achieved   PT SHORT TERM GOAL #2   Title report a 30% reduction in LBP and Rt LE pain at the end of a work day   Time 4   Period Weeks   Status Achieved           PT Long Term Goals - 08/09/15 1600    PT LONG TERM GOAL #1   Title be indepenent in advanced HEP   Time 6   Period Weeks   Status On-going   PT LONG TERM GOAL #2   Title reduce FOTO to < or = to 48% limitation   Time 6   Period Weeks   Status On-going   PT LONG TERM GOAL #3   Title report a 60% reduction in LBP and Rt LE pain at the end of a work day   Time 6   Period Weeks   Status On-going   PT LONG TERM GOAL #4   Time 6   Period Weeks   Status On-going   PT LONG TERM GOAL #5   Title sit for 45 minutes  without limitation   Time 6   Period Weeks   Status On-going               Plan - 08/09/15 1550    Clinical Impression Statement Pt arrived with upright trunk today due to no pain. Pt is limited flexibility in hamstrings bil and palpaple tenderness in bil lumbar paraspinalis. pt will continue to benefit from skilled Pt    Pt will benefit from skilled therapeutic intervention in order to improve on the following deficits Decreased range of motion;Decreased activity tolerance;Pain;Improper body mechanics;Decreased strength;Decreased mobility;Impaired flexibility   Rehab Potential Good   PT Frequency 2x / week   PT Duration 6 weeks   PT Treatment/Interventions ADLs/Self Care Home Management;Cryotherapy;Electrical Stimulation;Moist Heat;Ultrasound;Patient/family education;Functional mobility training;Neuromuscular re-education;Therapeutic exercise;Therapeutic activities;Manual techniques;Passive range of motion   PT Next Visit Plan Continue sitting on red physioball on UBE, lumbar stretching and soft tissue work to low back in sidelying   Consulted and Agree with Plan of Care Patient        Problem List Patient Active Problem List   Diagnosis Date Noted  . Cholelithiasis with cholecystitis 12/11/2012    NAUMANN-HOUEGNIFIO,ELKE PTA 08/09/2015, 4:21 PM  [facility] [address] [place], [place], [address] Phone: [phone]   Fax:  [phone]

## 2015-08-11 ENCOUNTER — Encounter: Payer: BLUE CROSS/BLUE SHIELD | Admitting: Physical Therapy

## 2015-08-16 ENCOUNTER — Encounter: Payer: Self-pay | Admitting: Physical Therapy

## 2015-08-16 ENCOUNTER — Ambulatory Visit: Payer: BLUE CROSS/BLUE SHIELD | Admitting: Physical Therapy

## 2015-08-16 DIAGNOSIS — R29898 Other symptoms and signs involving the musculoskeletal system: Secondary | ICD-10-CM

## 2015-08-16 DIAGNOSIS — M5441 Lumbago with sciatica, right side: Secondary | ICD-10-CM

## 2015-08-16 DIAGNOSIS — M25659 Stiffness of unspecified hip, not elsewhere classified: Secondary | ICD-10-CM

## 2015-08-16 NOTE — Patient Instructions (Addendum)
  Flexion   Sitting on knees, have two pillows  - fold body over legs and relax head and arms on bed as far forward as possible.  Hold  20 seconds if possible incr the time to up to 1  in Repeat  3 x with 20 sec hold. Or Repeat  1 x with one minute hold.  Do  1-2  sessions per day.  Copyright  VHI. All rights reserved.   Desiree Hane Houegnifio PTA [facility]  [phone]

## 2015-08-16 NOTE — Therapy (Signed)
[facility] [address] [place], [place], [address] Phone: [phone]   Fax:  [phone]  Physical Therapy Treatment  Patient Details  Name: Linda Cardenas MRN: [id number] Date of Birth: 1956/09/29 Referring Provider:  Ileana Ladd, MD  Encounter Date: 08/16/2015      PT End of Session - 08/16/15 1638    Visit Number 10   Date for PT Re-Evaluation 09/03/15   PT Start Time 1535   PT Stop Time 1639   PT Time Calculation (min) 64 min   Activity Tolerance Patient tolerated treatment well   Behavior During Therapy [facility] for tasks assessed/performed      Past Medical History  Diagnosis Date  . Back pain      CHRONIC LOW BACK PAIN - GETS INJECTIONS IN BACK  . Arthritis   . Thyroid disease   . GERD (gastroesophageal reflux disease)   . Hyperlipidemia   . Eczema   . Constipation   . Lactose intolerance   . Gallstones   . Hypothyroidism   . Elevated liver enzymes     Past Surgical History  Procedure Laterality Date  . Cesarean section    . Breast surgery      "PIMPLE" REMOVED FROM BREAST IN OFFICE  . Cholecystectomy  12/26/2012    Procedure: LAPAROSCOPIC CHOLECYSTECTOMY WITH INTRAOPERATIVE CHOLANGIOGRAM;  Surgeon: Velora Heckler, MD;  Location: [facility];  Service: General;  Laterality: N/A;  . Lumbar fusion N/A L4/5 04/2013    There were no vitals filed for this visit.  Visit Diagnosis:  Right-sided low back pain with right-sided sciatica  Weakness of both hips  Hip stiffness, unspecified laterality      Subjective Assessment - 08/16/15 1549    Subjective No complain this pm, but last Thursday my pain in low back was 8/10   Currently in Pain? No/denies                         [facility] Adult PT Treatment/Exercise - 08/16/15 0001    Exercises   Exercises Lumbar   Lumbar Exercises: Aerobic   UBE (Upper Arm Bike) L1 x6 (3/3) sitting on red ball   Lumbar Exercises: Standing   Other  Standing Lumbar Exercises standing agains foam roll -yellow t-band into bil horizontal abduction   Other Standing Lumbar Exercises red physioball bil horizontal abduction with yellow t-band   Lumbar Exercises: Prone   Other Prone Lumbar Exercises Prayer stretch withhin pt's limitations.   Other Prone Lumbar Exercises Sitting in halfkneeling with 2 pillows very tight quadriceps and stiffness in low back noted   Modalities   Modalities Electrical Stimulation   Moist Heat Therapy   Number Minutes Moist Heat 20 Minutes   Moist Heat Location Lumbar Spine   Electrical Stimulation   Electrical Stimulation Location Bilateral low back and Rt gluteals   Electrical Stimulation Action IFC   Electrical Stimulation Parameters 20   Electrical Stimulation Goals Tone   Manual Therapy   Manual Therapy Soft tissue mobilization   Manual therapy comments in left sidelying to bil lumbar paraspinals and QL on right side                PT Education - 08/16/15 1637    Education provided Yes   Education Details prayer stretch (childpose)   Person(s) Educated Patient   Methods Explanation;Demonstration;Handout   Comprehension Returned demonstration;Tactile cues required          PT  Short Term Goals - 08/05/15 1555    PT SHORT TERM GOAL #1   Title be independent in initial HEP   Time 4   Period Weeks   Status Achieved   PT SHORT TERM GOAL #2   Title report a 30% reduction in LBP and Rt LE pain at the end of a work day   Time 4   Period Weeks   Status Achieved           PT Long Term Goals - 08/16/15 1621    PT LONG TERM GOAL #1   Title be indepenent in advanced HEP   Time 6   Period Weeks   Status On-going   PT LONG TERM GOAL #2   Title reduce FOTO to < or = to 48% limitation   Time 6   Period Weeks   Status On-going   PT LONG TERM GOAL #3   Time 6   Period Weeks   Status On-going   PT LONG TERM GOAL #4   Title lift and carry objects at home as needed without limitation or  significant increase in pain   Time 6   Period Weeks   Status Achieved   PT LONG TERM GOAL #5   Title sit for 45 minutes without limitation  , and then needs to change position   Status Partially Met               Plan - 08/16/15 1644    Clinical Impression Statement Pt with uprigth posture due to no pain today. Patient has to move slowly and careful with transitions supine -sidelying to sit to avoid sharp pain, but PTA observed improvement with task since start of care. Pt will continue to benefit from  skilled PT   Pt will benefit from skilled therapeutic intervention in order to improve on the following deficits Decreased range of motion;Decreased activity tolerance;Pain;Improper body mechanics;Decreased strength;Decreased mobility;Impaired flexibility   Rehab Potential Good   PT Frequency 2x / week   PT Duration 6 weeks   PT Treatment/Interventions ADLs/Self Care Home Management;Cryotherapy;Electrical Stimulation;Moist Heat;Ultrasound;Patient/family education;Functional mobility training;Neuromuscular re-education;Therapeutic exercise;Therapeutic activities;Manual techniques;Passive range of motion   PT Next Visit Plan continue sitting on red physioball on UBE, review prayer stretch continue softtissue work to low back in sidelying.   Consulted and Agree with Plan of Care Patient        Problem List Patient Active Problem List   Diagnosis Date Noted  . Cholelithiasis with cholecystitis 12/11/2012    NAUMANN-HOUEGNIFIO,ELKE PTA 08/16/2015, 4:57 PM  [facility] [address] [place], [place], [address] Phone: [phone]   Fax:  [phone]

## 2015-08-18 ENCOUNTER — Encounter: Payer: BLUE CROSS/BLUE SHIELD | Admitting: Physical Therapy

## 2015-08-23 ENCOUNTER — Ambulatory Visit: Payer: BLUE CROSS/BLUE SHIELD | Attending: Family Medicine | Admitting: Physical Therapy

## 2015-08-23 ENCOUNTER — Encounter: Payer: Self-pay | Admitting: Physical Therapy

## 2015-08-23 DIAGNOSIS — M25659 Stiffness of unspecified hip, not elsewhere classified: Secondary | ICD-10-CM

## 2015-08-23 DIAGNOSIS — M6289 Other specified disorders of muscle: Secondary | ICD-10-CM | POA: Insufficient documentation

## 2015-08-23 DIAGNOSIS — M5441 Lumbago with sciatica, right side: Secondary | ICD-10-CM | POA: Insufficient documentation

## 2015-08-23 DIAGNOSIS — R29898 Other symptoms and signs involving the musculoskeletal system: Secondary | ICD-10-CM

## 2015-08-23 NOTE — Therapy (Signed)
[facility] [address] [place], [place], [address] Phone: [phone]   Fax:  [phone]  Physical Therapy Treatment  Patient Details  Name: Linda Cardenas MRN: [id number] Date of Birth: 18-Oct-1956 Referring Provider:  Ileana Ladd, MD  Encounter Date: 08/23/2015      PT End of Session - 08/23/15 1611    Visit Number 11   Date for PT Re-Evaluation 09/03/15   PT Start Time 1533   PT Stop Time 1614   PT Time Calculation (min) 41 min   Activity Tolerance Patient tolerated treatment well   Behavior During Therapy [facility] for tasks assessed/performed      Past Medical History  Diagnosis Date  . Back pain      CHRONIC LOW BACK PAIN - GETS INJECTIONS IN BACK  . Arthritis   . Thyroid disease   . GERD (gastroesophageal reflux disease)   . Hyperlipidemia   . Eczema   . Constipation   . Lactose intolerance   . Gallstones   . Hypothyroidism   . Elevated liver enzymes     Past Surgical History  Procedure Laterality Date  . Cesarean section    . Breast surgery      "PIMPLE" REMOVED FROM BREAST IN OFFICE  . Cholecystectomy  12/26/2012    Procedure: LAPAROSCOPIC CHOLECYSTECTOMY WITH INTRAOPERATIVE CHOLANGIOGRAM;  Surgeon: Velora Heckler, MD;  Location: [facility];  Service: General;  Laterality: N/A;  . Lumbar fusion N/A L4/5 04/2013    There were no vitals filed for this visit.  Visit Diagnosis:  Right-sided low back pain with right-sided sciatica  Weakness of both hips  Hip stiffness, unspecified laterality      Subjective Assessment - 08/23/15 1537    Subjective No current complaints regarding back, it did bother her over the weekend. She reports a "tingling" along the left side of her head that is concerning her. Plans to mention to MD if it continues.    Currently in Pain? No/denies   Multiple Pain Sites No                         [facility] Adult PT Treatment/Exercise - 08/23/15 0001    Lumbar Exercises: Stretches   Single Knee to Chest Stretch 3 reps;10 seconds  performed on heat   Lower Trunk Rotation --  rocking 10x bil on heat   Quadruped Mid Back Stretch --  Seated roll green ball out into flexion strewtch 10x   Lumbar Exercises: Aerobic   UBE (Upper Arm Bike) L1 x6 (3/3) sitting on red ball   Lumbar Exercises: Seated   Other Seated Lumbar Exercises On red ball,   yellow horizontal abd 2x 10   Lumbar Exercises: Supine   Ab Set 10 reps;2 seconds   Bent Knee Raise 10 reps   Other Supine Lumbar Exercises Single leg drop outs bil 10 x   Moist Heat Therapy   Number Minutes Moist Heat --  During supine exercises   Moist Heat Location Lumbar Spine                  PT Short Term Goals - 08/23/15 1552    PT SHORT TERM GOAL #1   Title be independent in initial HEP   Time 4   Period Weeks   Status Achieved   PT SHORT TERM GOAL #2   Title report a 30% reduction in LBP and Rt LE pain at the end of  a work day   Time 4   Period Weeks   Status Achieved           PT Long Term Goals - 08/16/15 1621    PT LONG TERM GOAL #1   Title be indepenent in advanced HEP   Time 6   Period Weeks   Status On-going   PT LONG TERM GOAL #2   Title reduce FOTO to < or = to 48% limitation   Time 6   Period Weeks   Status On-going   PT LONG TERM GOAL #3   Time 6   Period Weeks   Status On-going   PT LONG TERM GOAL #4   Title lift and carry objects at home as needed without limitation or significant increase in pain   Time 6   Period Weeks   Status Achieved   PT LONG TERM GOAL #5   Title sit for 45 minutes without limitation  , and then needs to change position   Status Partially Met               Plan - 08/23/15 1612    Clinical Impression Statement Pt did excellent with her supine exercises, but upon rising her back began to hurt. She declined Estim and said she would go home and put her TENS unit on.    Pt will benefit from skilled  therapeutic intervention in order to improve on the following deficits Decreased range of motion;Decreased activity tolerance;Pain;Improper body mechanics;Decreased strength;Decreased mobility;Impaired flexibility   Rehab Potential Good   PT Frequency 2x / week   PT Duration 6 weeks   PT Treatment/Interventions ADLs/Self Care Home Management;Cryotherapy;Electrical Stimulation;Moist Heat;Ultrasound;Patient/family education;Functional mobility training;Neuromuscular re-education;Therapeutic exercise;Therapeutic activities;Manual techniques;Passive range of motion   PT Next Visit Plan Core stabs in sitting   Consulted and Agree with Plan of Care Patient        Problem List Patient Active Problem List   Diagnosis Date Noted  . Cholelithiasis with cholecystitis 12/11/2012    Austyn Perriello, PTA 08/23/2015, 4:13 PM  [facility] [address] [place], [place], [address] Phone: [phone]   Fax:  [phone]

## 2015-08-25 ENCOUNTER — Encounter: Payer: BLUE CROSS/BLUE SHIELD | Admitting: Physical Therapy

## 2015-08-30 ENCOUNTER — Ambulatory Visit: Payer: BLUE CROSS/BLUE SHIELD | Admitting: Physical Therapy

## 2015-08-30 ENCOUNTER — Encounter: Payer: Self-pay | Admitting: Physical Therapy

## 2015-08-30 DIAGNOSIS — M5441 Lumbago with sciatica, right side: Secondary | ICD-10-CM | POA: Diagnosis not present

## 2015-08-30 DIAGNOSIS — R29898 Other symptoms and signs involving the musculoskeletal system: Secondary | ICD-10-CM

## 2015-08-30 DIAGNOSIS — M25659 Stiffness of unspecified hip, not elsewhere classified: Secondary | ICD-10-CM

## 2015-08-30 NOTE — Therapy (Addendum)
[facility] [address] [place], [place], [address] Phone: [phone]   Fax:  [phone]  Physical Therapy Treatment  Patient Details  Name: Linda Cardenas MRN: [id number] Date of Birth: 09/09/1956 Referring Provider:  Ileana Ladd, MD  Encounter Date: 08/30/2015      PT End of Session - 08/30/15 1543    Visit Number 12   Date for PT Re-Evaluation 09/03/15   PT Start Time 1532   PT Stop Time 1625   PT Time Calculation (min) 53 min   Activity Tolerance Patient limited by pain   Behavior During Therapy [facility] for tasks assessed/performed      Past Medical History  Diagnosis Date  . Back pain      CHRONIC LOW BACK PAIN - GETS INJECTIONS IN BACK  . Arthritis   . Thyroid disease   . GERD (gastroesophageal reflux disease)   . Hyperlipidemia   . Eczema   . Constipation   . Lactose intolerance   . Gallstones   . Hypothyroidism   . Elevated liver enzymes     Past Surgical History  Procedure Laterality Date  . Cesarean section    . Breast surgery      "PIMPLE" REMOVED FROM BREAST IN OFFICE  . Cholecystectomy  12/26/2012    Procedure: LAPAROSCOPIC CHOLECYSTECTOMY WITH INTRAOPERATIVE CHOLANGIOGRAM;  Surgeon: Velora Heckler, MD;  Location: [facility];  Service: General;  Laterality: N/A;  . Lumbar fusion N/A L4/5 04/2013    There were no vitals filed for this visit.  Visit Diagnosis:  Weakness of both hips  Right-sided low back pain with right-sided sciatica  Hip stiffness, unspecified laterality      Subjective Assessment - 08/30/15 1539    Subjective Pt has flare up and rates her pain in low back as 8/10, constant pain in right foot difficult to ambulate. Pt has been at work today, but it was difficult.   Limitations Sitting;Standing;Walking   Currently in Pain? Yes   Pain Score 8    Pain Location Back   Pain Orientation Right;Left   Pain Descriptors / Indicators Heaviness;Squeezing   Pain Type Chronic  pain   Pain Radiating Towards Rt leg down to the foot   Pain Onset More than a month ago   Pain Frequency Constant   Aggravating Factors  sitting, cooking int the kitchen with prolonged standing   Pain Relieving Factors ibuprofen   Multiple Pain Sites No            [facility] PT Assessment - 08/30/15 0001    Assessment   Medical Diagnosis Lumbar radiculopathy (M54.16) Chronic back pain s/p lumbar fusion   Onset Date/Surgical Date 04/26/13   Precautions   Precautions None   Balance Screen   Has the patient fallen in the past 6 months No   Has the patient had a decrease in activity level because of a fear of falling?  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     Strength   Overall Strength Deficits   Overall Strength Comments hip flex: right3+/5, left 4/5, knees left 4+/5, right 4/5,    Bed Mobility   Bed Mobility Supine to Sit   Rolling Right 6: Modified independent (Device/Increase time)   Right Sidelying to Sit 6: Modified independent (Device/Increase time)   Ambulation/Gait   Ambulation/Gait Yes   Ambulation/Gait Assistance 6: Modified independent (Device/Increase time)  slow antalgic gait                     [facility] Adult PT Treatment/Exercise - 08/30/15 0001    Posture/Postural Control   Posture/Postural Control Postural limitations   Postural Limitations Forward head;Rounded  Shoulders;Increased thoracic kyphosis   Exercises   Exercises Lumbar   Lumbar Exercises: Stretches   Active Hamstring Stretch 3 reps;20 seconds  each leg using strap, while on heat   Active Hamstring Stretch Limitations Neural stretch each leg 2 x 10 DF with A by PTA for leg  while on heat   Single Knee to Chest Stretch 3 reps;10 seconds  while on hot moist pack   Lumbar Exercises: Aerobic   UBE (Upper Arm Bike) --  unable to perform today   Moist Heat Therapy   Number Minutes Moist Heat --  during treatment lying on heat   Moist Heat Location Lumbar Spine                  PT Short Term Goals - 08/23/15 1552    PT SHORT TERM GOAL #1   Title be independent in initial HEP   Time 4   Period Weeks   Status Achieved   PT SHORT TERM GOAL #2   Title report a 30% reduction in LBP and Rt LE pain at the end of a work day   Time 4   Period Weeks   Status Achieved           PT Long Term Goals - 08/30/15 1547    PT LONG TERM GOAL #1   Title be indepenent in advanced HEP   Time 6   Period Weeks   Status On-going   PT LONG TERM GOAL #2   Title reduce FOTO to < or = to 48% limitation  scored 69%   Time 6   Period Weeks   Status Not Met   PT LONG TERM GOAL #3   Title report a 60% reduction in LBP and Rt LE pain at the end of a work day   Time 6   Period Weeks   Status Not Met   PT LONG TERM GOAL #4   Title lift and carry objects at home as needed without limitation or significant increase in pain   Time 6   Period Weeks   Status Achieved   PT LONG TERM GOAL #5   Title sit for 45 minutes without limitation   Time 6   Period Weeks   Status Partially Met               Plan - 08/30/15 1545    Clinical Impression Statement Pt with limited activity tolerance today due to increase of pain in low back.    Pt will benefit from skilled therapeutic intervention in order to improve on the following deficits Decreased range of motion;Decreased  activity  tolerance;Pain;Improper body mechanics;Decreased strength;Decreased mobility;Impaired flexibility   Rehab Potential Good   PT Frequency 2x / week   PT Duration 6 weeks   PT Next Visit Plan Pt is placed on hold for 2 weeks, then  D/C v/s renewal.  Pt is inconsistent with progression with PT and presents today with flare up and pain rated as 8/10. Pt advised to consult Dr. for further diagnostic.    Consulted and Agree with Plan of Care Patient        Problem List Patient Active Problem List   Diagnosis Date Noted  . Cholelithiasis with cholecystitis 12/11/2012    NAUMANN-HOUEGNIFIO,Enslie Sahota PTA 08/30/2015, 5:36 PM PHYSICAL THERAPY DISCHARGE SUMMARY  Visits from Start of Care: 12  Current functional level related to goals / functional outcomes: See above for current status and goal progress.     Remaining deficits: See above for current status.     Education / Equipment: HEP, Estate manager/land agent Plan: Patient agrees to discharge.  Patient goals were partially met. Patient is being discharged due to being pleased with the current functional level.  ?????   Lorrene Reid, PT 09/22/2015 12:39 PM  [facility] [address] [place], [place], [address] Phone: [phone]   Fax:  [phone]

## 2015-09-01 ENCOUNTER — Encounter: Payer: BLUE CROSS/BLUE SHIELD | Admitting: Physical Therapy

## 2016-03-09 ENCOUNTER — Ambulatory Visit: Payer: BLUE CROSS/BLUE SHIELD | Admitting: Physical Therapy

## 2016-03-13 ENCOUNTER — Encounter: Payer: BLUE CROSS/BLUE SHIELD | Admitting: Physical Therapy

## 2016-03-23 ENCOUNTER — Encounter: Payer: BLUE CROSS/BLUE SHIELD | Admitting: Physical Therapy

## 2016-03-28 ENCOUNTER — Encounter: Payer: BLUE CROSS/BLUE SHIELD | Admitting: Physical Therapy

## 2016-03-30 ENCOUNTER — Encounter: Payer: BLUE CROSS/BLUE SHIELD | Admitting: Physical Therapy

## 2016-04-04 ENCOUNTER — Encounter: Payer: BLUE CROSS/BLUE SHIELD | Admitting: Physical Therapy

## 2016-04-06 ENCOUNTER — Encounter: Payer: BLUE CROSS/BLUE SHIELD | Admitting: Physical Therapy

## 2016-04-11 ENCOUNTER — Encounter: Payer: BLUE CROSS/BLUE SHIELD | Admitting: Physical Therapy

## 2019-01-07 ENCOUNTER — Ambulatory Visit (INDEPENDENT_AMBULATORY_CARE_PROVIDER_SITE_OTHER): Payer: BLUE CROSS/BLUE SHIELD | Admitting: Cardiology

## 2019-01-07 ENCOUNTER — Ambulatory Visit: Payer: BLUE CROSS/BLUE SHIELD

## 2019-01-07 ENCOUNTER — Encounter: Payer: Self-pay | Admitting: Cardiology

## 2019-01-07 VITALS — BP 122/64 | HR 69 | Ht 59.0 in | Wt 172.0 lb

## 2019-01-07 DIAGNOSIS — R0602 Shortness of breath: Secondary | ICD-10-CM

## 2019-01-07 DIAGNOSIS — R7303 Prediabetes: Secondary | ICD-10-CM | POA: Diagnosis not present

## 2019-01-07 DIAGNOSIS — R0600 Dyspnea, unspecified: Secondary | ICD-10-CM

## 2019-01-07 DIAGNOSIS — R0609 Other forms of dyspnea: Secondary | ICD-10-CM

## 2019-01-07 DIAGNOSIS — E782 Mixed hyperlipidemia: Secondary | ICD-10-CM

## 2019-01-07 DIAGNOSIS — Z0189 Encounter for other specified special examinations: Secondary | ICD-10-CM

## 2019-01-07 DIAGNOSIS — R079 Chest pain, unspecified: Secondary | ICD-10-CM | POA: Diagnosis not present

## 2019-01-07 MED ORDER — ROSUVASTATIN CALCIUM 20 MG PO TABS: 20.0000 mg | ORAL_TABLET | Freq: Every day | ORAL | 2 refills | Status: DC

## 2019-01-07 MED ORDER — METOPROLOL TARTRATE 25 MG PO TABS: 25.0000 mg | ORAL_TABLET | Freq: Two times a day (BID) | ORAL | 2 refills | Status: DC

## 2019-01-07 NOTE — Progress Notes (Signed)
Subjective:  Primary Physician:  Vernie Shanks, MD  Patient ID: Linda Cardenas, female    DOB: November 02, 1956, 63 y.o.   MRN: [id number]  Chief Complaint  Patient presents with  . Shortness of Breath  . New Patient (Initial Visit)    HPI: Linda Cardenas  is a 63 y.o. female  with history of hyperlipidemia, hypothyroidism and goiter, chronic back pain,severe degenerative disc disease and chronic back pain is referred to me an urgent basis for evaluation of worsening dyspnea on exertion.  Symptoms started about 6 months ago.  Over the past 2-3 months, symptoms or gotten worse, patient even with minimal activity starts to have dyspnea and also occasional episodes of wheezing.  No PND or orthopnea, no leg edema.  No painful swelling of the lower extremity.  No hemoptysis.  Over the past 2 months she is also noticed episodes of exertional jaw pain and is relieved with rest.  Episodes last several minutes.  Past Medical History:  Diagnosis Date  . Arthritis   . Back pain     CHRONIC LOW BACK PAIN - GETS INJECTIONS IN BACK  . Constipation   . Eczema   . Elevated liver enzymes   . Gallstones   . GERD (gastroesophageal reflux disease)   . Hyperlipidemia   . Hypothyroidism   . Lactose intolerance   . Shortness of breath   . Thyroid disease     Past Surgical History:  Procedure Laterality Date  . BREAST SURGERY     "PIMPLE" REMOVED FROM BREAST IN OFFICE  . CESAREAN SECTION    . CHOLECYSTECTOMY  12/26/2012   Procedure: LAPAROSCOPIC CHOLECYSTECTOMY WITH INTRAOPERATIVE CHOLANGIOGRAM;  Surgeon: Earnstine Regal, MD;  Location: [facility];  Service: General;  Laterality: N/A;  . LUMBAR FUSION N/A L4/5 04/2013    Social History   Socioeconomic History  . Marital status: Married    Spouse name: Not on file  . Number of children: 2  . Years of education: Not on file  . Highest education level: Not on file  Occupational History  . Not on file  Social Needs  . Financial resource strain: Not on  file  . Food insecurity:    Worry: Not on file    Inability: Not on file  . Transportation needs:    Medical: Not on file    Non-medical: Not on file  Tobacco Use  . Smoking status: Never Smoker  . Smokeless tobacco: Never Used  Substance and Sexual Activity  . Alcohol use: No  . Drug use: No  . Sexual activity: Not on file  Lifestyle  . Physical activity:    Days per week: Not on file    Minutes per session: Not on file  . Stress: Not on file  Relationships  . Social connections:    Talks on phone: Not on file    Gets together: Not on file    Attends religious service: Not on file    Active member of club or organization: Not on file    Attends meetings of clubs or organizations: Not on file    Relationship status: Not on file  . Intimate partner violence:    Fear of current or ex partner: Not on file    Emotionally abused: Not on file    Physically abused: Not on file    Forced sexual activity: Not on file  Other Topics Concern  . Not on file  Social History Narrative  . Not on file  Current Outpatient Medications on File Prior to Visit  Medication Sig Dispense Refill  . aspirin 81 MG tablet Take 81 mg by mouth daily.    . calcium carbonate (OS-CAL) 600 MG TABS Take 600 mg by mouth daily.    . Cholecalciferol (VITAMIN D) 2000 UNITS tablet Take 2,000 Units by mouth daily.    Marland Kitchen levothyroxine (SYNTHROID, LEVOTHROID) 75 MCG tablet Take 75 mcg by mouth daily before breakfast.     . Multiple Vitamins-Minerals (MULTIVITAMIN WITH MINERALS) tablet Take 1 tablet by mouth daily.    Marland Kitchen docusate sodium (COLACE) 50 MG capsule Take by mouth 2 (two) times daily.    . magnesium gluconate (MAGONATE) 500 MG tablet Take 500 mg by mouth daily as needed. constipation    . NON FORMULARY Spinal shot once per year-last time 01/15/13.Dr Eduard Clos in [place] [phone]     No current facility-administered medications on file prior to visit.      Review of Systems  Constitutional:  Negative for malaise/fatigue and weight loss.  Respiratory: Positive for shortness of breath and wheezing. Negative for cough and hemoptysis.   Cardiovascular: Positive for chest pain. Negative for palpitations, claudication and leg swelling.  Gastrointestinal: Positive for heartburn (occasional). Negative for abdominal pain, blood in stool, constipation and vomiting.  Genitourinary: Negative for dysuria.  Musculoskeletal: Positive for back pain. Negative for joint pain and myalgias.  Neurological: Negative for dizziness, focal weakness and headaches.  Endo/Heme/Allergies: Does not bruise/bleed easily.  Psychiatric/Behavioral: Negative for depression. The patient is not nervous/anxious.   All other systems reviewed and are negative.      Objective:  Blood pressure 122/64, pulse 69, height 4\' 11"  (1.499 m), weight 172 lb (78 kg), SpO2 97 %. Body mass index is 34.74 kg/m.   Physical Exam  Constitutional: She appears well-developed. No distress.  Mild to moderately obese and mostly trunkal obesity.  HENT:  Head: Atraumatic.  Eyes: Conjunctivae are normal.  Neck: Neck supple. No JVD present. No thyromegaly present.  Cardiovascular: Normal rate, regular rhythm, normal heart sounds and intact distal pulses. Exam reveals no gallop.  No murmur heard. Pulmonary/Chest: Effort normal and breath sounds normal.  Abdominal: Soft. Bowel sounds are normal.  Musculoskeletal: Normal range of motion.        General: No edema.  Neurological: She is alert.  Skin: Skin is warm and dry.  Psychiatric: She has a normal mood and affect.     CARDIAC STUDIES:  None  Assessment & Recommendations:   1. DOE (dyspnea on exertion) EKG 01/07/2019: Normal sinus rhythm at rate of 72 bpm, borderline criteria for left atrial enlargement, normal axis.  No evidence of ischemia, otherwise normal EKG. - EKG 12-Lead - PCV ECHOCARDIOGRAM COMPLETE; Future - PCV MYOCARDIAL PERFUSION WITH LEXISCAN; Future  2.  Exertional chest pain She has exertional jaw pain which is echo: To exertional chest pain, symptoms are very suggestive of angina pectoris. - metoprolol tartrate (LOPRESSOR) 25 MG tablet; Take 1 tablet (25 mg total) by mouth 2 (two) times daily.  Dispense: 60 tablet; Refill: 2 - PCV MYOCARDIAL PERFUSION WITH LEXISCAN; Future  3. Mixed hyperlipidemia Patient is on simvastatin, will discontinue this.  Lipids are still not at goal. - rosuvastatin (CRESTOR) 20 MG tablet; Take 1 tablet (20 mg total) by mouth daily for 30 days.  Dispense: 30 tablet; Refill: 2  4. Pre-diabetes I have discussed with her regarding making dietary changes, low glycemic diet discussed.  5. Laboratory examination Labs 12/31/2018: HB 12.4/HCT 36.4, platelets 232.  Subjective:  Primary Physician:  Vernie Shanks, MD  Patient ID: Linda Cardenas, female    DOB: November 02, 1956, 63 y.o.   MRN: [id number]  Chief Complaint  Patient presents with  . Shortness of Breath  . New Patient (Initial Visit)    HPI: Linda Cardenas  is a 63 y.o. female  with history of hyperlipidemia, hypothyroidism and goiter, chronic back pain,severe degenerative disc disease and chronic back pain is referred to me an urgent basis for evaluation of worsening dyspnea on exertion.  Symptoms started about 6 months ago.  Over the past 2-3 months, symptoms or gotten worse, patient even with minimal activity starts to have dyspnea and also occasional episodes of wheezing.  No PND or orthopnea, no leg edema.  No painful swelling of the lower extremity.  No hemoptysis.  Over the past 2 months she is also noticed episodes of exertional jaw pain and is relieved with rest.  Episodes last several minutes.  Past Medical History:  Diagnosis Date  . Arthritis   . Back pain     CHRONIC LOW BACK PAIN - GETS INJECTIONS IN BACK  . Constipation   . Eczema   . Elevated liver enzymes   . Gallstones   . GERD (gastroesophageal reflux disease)   . Hyperlipidemia   . Hypothyroidism   . Lactose intolerance   . Shortness of breath   . Thyroid disease     Past Surgical History:  Procedure Laterality Date  . BREAST SURGERY     "PIMPLE" REMOVED FROM BREAST IN OFFICE  . CESAREAN SECTION    . CHOLECYSTECTOMY  12/26/2012   Procedure: LAPAROSCOPIC CHOLECYSTECTOMY WITH INTRAOPERATIVE CHOLANGIOGRAM;  Surgeon: Earnstine Regal, MD;  Location: [facility];  Service: General;  Laterality: N/A;  . LUMBAR FUSION N/A L4/5 04/2013    Social History   Socioeconomic History  . Marital status: Married    Spouse name: Not on file  . Number of children: 2  . Years of education: Not on file  . Highest education level: Not on file  Occupational History  . Not on file  Social Needs  . Financial resource strain: Not on  file  . Food insecurity:    Worry: Not on file    Inability: Not on file  . Transportation needs:    Medical: Not on file    Non-medical: Not on file  Tobacco Use  . Smoking status: Never Smoker  . Smokeless tobacco: Never Used  Substance and Sexual Activity  . Alcohol use: No  . Drug use: No  . Sexual activity: Not on file  Lifestyle  . Physical activity:    Days per week: Not on file    Minutes per session: Not on file  . Stress: Not on file  Relationships  . Social connections:    Talks on phone: Not on file    Gets together: Not on file    Attends religious service: Not on file    Active member of club or organization: Not on file    Attends meetings of clubs or organizations: Not on file    Relationship status: Not on file  . Intimate partner violence:    Fear of current or ex partner: Not on file    Emotionally abused: Not on file    Physically abused: Not on file    Forced sexual activity: Not on file  Other Topics Concern  . Not on file  Social History Narrative  . Not on file

## 2019-01-15 ENCOUNTER — Telehealth: Payer: Self-pay

## 2019-01-15 NOTE — Telephone Encounter (Signed)
Ask her to take Crestor only 1/2 tablet daily and also take Metoprolol 1/2 tablet daily

## 2019-01-15 NOTE — Telephone Encounter (Signed)
Pt husband called c/o wife not feeling well; she is weak, she is having sob and cp "she feels like her soul is coming out" they think that it has something to do with the medicine, should she continue the meds?

## 2019-01-16 NOTE — Telephone Encounter (Signed)
Pt aware and will call back if needed  

## 2019-01-20 ENCOUNTER — Ambulatory Visit: Payer: BLUE CROSS/BLUE SHIELD | Admitting: Cardiology

## 2019-01-20 DIAGNOSIS — R0609 Other forms of dyspnea: Secondary | ICD-10-CM | POA: Diagnosis not present

## 2019-01-20 DIAGNOSIS — R0600 Dyspnea, unspecified: Secondary | ICD-10-CM

## 2019-01-20 DIAGNOSIS — R0789 Other chest pain: Secondary | ICD-10-CM | POA: Diagnosis not present

## 2019-01-20 DIAGNOSIS — R079 Chest pain, unspecified: Secondary | ICD-10-CM

## 2019-01-23 ENCOUNTER — Ambulatory Visit: Payer: Self-pay | Admitting: Cardiology

## 2019-01-27 NOTE — Progress Notes (Signed)
Subjective:  Primary Physician:  Ileana Ladd, MD  Patient ID: Linda Cardenas, female    DOB: 02-03-1956, 63 y.o.   MRN: [id number]  Chief Complaint  Patient presents with  . Chest Pain  . Shortness of Breath  . Follow-up    3 week, last EKG 01/07/19    HPI: Linda Cardenas  is a 63 y.o. female  with history of hyperlipidemia, Hyperglycemia,hypothyroidism and goiter, chronic back pain,severe degenerative disc disease and chronic back pain Was seen by me on 01/07/2019 with exertional chest discomfort with radiation to her jaw, worsening shortness of breath with minimal activities That started about 2-3 months ago.  Underwent stress testing and echocardiogram and presents for follow-up.  And started her on Metoprolol 25 mg p.o. b.i.d. and switched her from low-dose simvastatin to Crestor.  She could not tolerate metoprolol due to dizziness and has reduced the dose in half b.i.d. which she is tolerating and remains asymptomatic.  Has noticed mild improvement in exertional chest discomfort but still continues to have them at least once every 3-4 days.  Continues to have exertional dyspnea but no PND or orthopnea, denies leg edema.  Husband present. Over the past 2-3 months, symptoms or gotten worse, patient even with minimal activity starts to have dyspnea and also occasional episodes of wheezing.  No PND or orthopnea, no leg edema.  No painful swelling of the lower extremity.  No hemoptysis.  Over the past 2 months she is also noticed episodes of exertional jaw pain and is relieved with rest.  Episodes last several minutes.  Past Medical History:  Diagnosis Date  . Arthritis   . Back pain     CHRONIC LOW BACK PAIN - GETS INJECTIONS IN BACK  . Constipation   . Eczema   . Elevated liver enzymes   . Gallstones   . GERD (gastroesophageal reflux disease)   . Hyperlipidemia   . Hypothyroidism   . Lactose intolerance   . Shortness of breath   . Thyroid disease     Past Surgical  History:  Procedure Laterality Date  . BREAST SURGERY     "PIMPLE" REMOVED FROM BREAST IN OFFICE  . CESAREAN SECTION    . CHOLECYSTECTOMY  12/26/2012   Procedure: LAPAROSCOPIC CHOLECYSTECTOMY WITH INTRAOPERATIVE CHOLANGIOGRAM;  Surgeon: Velora Heckler, MD;  Location: [facility];  Service: General;  Laterality: N/A;  . LUMBAR FUSION N/A L4/5 04/2013    Social History   Socioeconomic History  . Marital status: Married    Spouse name: Not on file  . Number of children: 2  . Years of education: Not on file  . Highest education level: Not on file  Occupational History  . Not on file  Social Needs  . Financial resource strain: Not on file  . Food insecurity:    Worry: Not on file    Inability: Not on file  . Transportation needs:    Medical: Not on file    Non-medical: Not on file  Tobacco Use  . Smoking status: Never Smoker  . Smokeless tobacco: Never Used  Substance and Sexual Activity  . Alcohol use: No  . Drug use: No  . Sexual activity: Not on file  Lifestyle  . Physical activity:    Days per week: Not on file    Minutes per session: Not on file  . Stress: Not on file  Relationships  . Social connections:    Talks on phone: Not on file    Gets  together: Not on file    Attends religious service: Not on file    Active member of club or organization: Not on file    Attends meetings of clubs or organizations: Not on file    Relationship status: Not on file  . Intimate partner violence:    Fear of current or ex partner: Not on file    Emotionally abused: Not on file    Physically abused: Not on file    Forced sexual activity: Not on file  Other Topics Concern  . Not on file  Social History Narrative  . Not on file    Current Outpatient Medications on File Prior to Visit  Medication Sig Dispense Refill  . aspirin 81 MG tablet Take 81 mg by mouth daily.    . calcium carbonate (OS-CAL) 600 MG TABS Take 600 mg by mouth daily.    . Cholecalciferol (VITAMIN D) 2000 UNITS  tablet Take 2,000 Units by mouth daily.    Marland Kitchen levothyroxine (SYNTHROID, LEVOTHROID) 75 MCG tablet Take 75 mcg by mouth daily before breakfast.     . magnesium gluconate (MAGONATE) 500 MG tablet Take 500 mg by mouth daily as needed. constipation    . Multiple Vitamins-Minerals (MULTIVITAMIN WITH MINERALS) tablet Take 1 tablet by mouth daily.    . rosuvastatin (CRESTOR) 20 MG tablet Take 1 tablet (20 mg total) by mouth daily for 30 days. 30 tablet 2  . docusate sodium (COLACE) 50 MG capsule Take by mouth 2 (two) times daily.    . NON FORMULARY Spinal shot once per year-last time 01/15/13.Dr Eduard Clos in [place] [phone]     No current facility-administered medications on file prior to visit.     Review of Systems  Constitutional: Negative for malaise/fatigue and weight loss.  Respiratory: Positive for shortness of breath and wheezing. Negative for cough and hemoptysis.   Cardiovascular: Positive for chest pain (with radiation to her chin with exertion). Negative for palpitations, claudication and leg swelling.  Gastrointestinal: Positive for heartburn (occasional). Negative for abdominal pain, blood in stool, constipation and vomiting.  Genitourinary: Negative for dysuria.  Musculoskeletal: Positive for back pain. Negative for joint pain and myalgias.  Neurological: Negative for dizziness, focal weakness and headaches.  Endo/Heme/Allergies: Does not bruise/bleed easily.  Psychiatric/Behavioral: Negative for depression. The patient is not nervous/anxious.   All other systems reviewed and are negative.      Objective:  Blood pressure (!) 96/49, pulse (!) 53, height 4\' 11"  (1.499 m), weight 173 lb 9.6 oz (78.7 kg), SpO2 96 %. Body mass index is 35.06 kg/m.   Physical Exam  Constitutional: She appears well-developed. No distress.  Moderately obese and mostly trunkal obesity.  HENT:  Head: Atraumatic.  Eyes: Conjunctivae are normal.  Neck: Neck supple. No JVD present. No thyromegaly  present.  Cardiovascular: Normal rate, regular rhythm, normal heart sounds and intact distal pulses. Exam reveals no gallop.  No murmur heard. Pulmonary/Chest: Effort normal and breath sounds normal.  Abdominal: Soft. Bowel sounds are normal.  Musculoskeletal: Normal range of motion.        General: No edema.  Neurological: She is alert.  Skin: Skin is warm and dry.  Psychiatric: She has a normal mood and affect.   CARDIAC STUDIES:  Lexiscan Sestamibi stress test 01/17/2019:  1. Lexiscan stress test  with low level exercise was performed. Exercise capacity was not assessed. Patient reached HR of 135 bpm which was 85% of the maximum predicted heart rate. No stress symptoms reported. Normal hemodynamic  response seen. The stress electrocardiogram with low level exercise showed sinus tachycardia, normal stress conduction, no stress arrhythmias, <1 mm upsloping ST depression in inferolateral leads. These changes are not suggestive of ischemia.  2. The overall quality of the study is excellent. There is no evidence of abnormal lung activity. Stress and rest SPECT images demonstrate homogeneous tracer distribution throughout the myocardium. Gated SPECT imaging reveals normal myocardial thickening and wall motion. The left ventricular ejection fraction was normal (67%).   3. Low risk study.   Echocardiogram 01/07/2019: Left ventricle cavity is normal in size. Mild concentric hypertrophy of the left ventricle. Normal global wall motion. Doppler evidence of grade I (impaired) diastolic dysfunction, normal LAP. Calculated EF 68%. Mild (Grade I) mitral regurgitation. Inadequate TR jet to estimate pulmonary artery systolic pressure. IVC is dilated with respiratory variation. This may suggest elevated right heart pressure  Assessment & Recommendations:   1. DOE (dyspnea on exertion) EKG 01/07/2019: Normal sinus rhythm at rate of 72 bpm, borderline criteria for left atrial enlargement, normal axis.  No  evidence of ischemia, otherwise normal EKG.  2. Exertional chest pain She has exertional jaw pain which is echo: To exertional chest pain, symptoms are very suggestive of angina pectoris.  3. Mixed hyperlipidemia Patient is on simvastatin, will discontinue this.  Lipids are still not at goal. - rosuvastatin (CRESTOR) 20 MG tablet; Take 1 tablet (20 mg total) by mouth daily for 30 days.  Dispense: 30 tablet; Refill: 2  4. Pre-diabetes I have discussed with her regarding making dietary changes, low glycemic diet discussed.  5. Hyperglycemia:  6. Laboratory examination Labs 12/31/2018: HB 12.4/HCT 36.4, platelets 232.  Normal indicis.  Labs 05/21/2018: Serum glucose 112 mg, BUN 12, creatinine 0.73, eGFR greater than 61, potassium 3.8.  CMP otherwise normal.  TSH 5.10, minimally elevated.  Vitamin D 29.0.  Total cholesterol 285, triglycerides 254, HDL 63, LDL 171, non-HDL cholesterol 222.  A1c 6.2%.   Recommendation:   I have reviewed the results of the echocardiogram, no significant valvular heart disease that I'm concerned.  She does have grade 1 diastolic dysfunction which could explain her dyspnea.  I had set her up for a treadmill stress test but she could not complete even 3 minutes due to marked fatigue, shortness of breath and hence had to be switched over to from plastics test.  Severe deconditioning with obesity hypoventilation is contributing.  Nuclear stress test fortunately is nonischemic/low risk and hence have reassured her.  Continue high-intensity high-dose statins, she has a scheduled appointment to see Dr. Modesto Charon in 3-4 weeks, it would be appropriate to repeat her lipids at that time and I will like to obtain a copy.  I prescribed her sublingual nitroglycerin and advised her how to use this.  She'll contact me if she has to suspect frequently.  Switched her from metoprolol Tartarate to 12.5 mg b.i.d. to Succinate 25 mg daily, may consider switching her from metoprolol succinate  to amlodipine as angina could also be due to vasospastic angina.  In view of markedly reduced exercise tolerance, unable to tolerate higher dose beta blocker, if symptoms persist will consider coronary angiography and right heart catheterization.  Weight loss was discussed extensively.  I'd like to see her back in 3 months for close monitoring. Yates Decamp, MD, [facility] 01/29/2019, 6:12 AM [facility] [place] Pager: [phone] Office: [phone] If no answer Cell [phone]

## 2019-01-28 ENCOUNTER — Ambulatory Visit (INDEPENDENT_AMBULATORY_CARE_PROVIDER_SITE_OTHER): Payer: BLUE CROSS/BLUE SHIELD | Admitting: Cardiology

## 2019-01-28 ENCOUNTER — Encounter: Payer: Self-pay | Admitting: Cardiology

## 2019-01-28 VITALS — BP 96/49 | HR 53 | Ht 59.0 in | Wt 173.6 lb

## 2019-01-28 DIAGNOSIS — I209 Angina pectoris, unspecified: Secondary | ICD-10-CM

## 2019-01-28 DIAGNOSIS — R0609 Other forms of dyspnea: Secondary | ICD-10-CM

## 2019-01-28 DIAGNOSIS — R0600 Dyspnea, unspecified: Secondary | ICD-10-CM

## 2019-01-28 DIAGNOSIS — E782 Mixed hyperlipidemia: Secondary | ICD-10-CM

## 2019-01-28 MED ORDER — METOPROLOL SUCCINATE ER 25 MG PO TB24: 25.0000 mg | ORAL_TABLET | Freq: Every day | ORAL | 1 refills | Status: DC

## 2019-01-28 MED ORDER — NITROGLYCERIN 0.4 MG SL SUBL
0.4000 mg | SUBLINGUAL_TABLET | SUBLINGUAL | 3 refills | Status: DC | PRN
Start: 2019-01-28 — End: 2024-03-14

## 2019-01-29 ENCOUNTER — Encounter: Payer: Self-pay | Admitting: Cardiology

## 2019-03-29 ENCOUNTER — Other Ambulatory Visit: Payer: Self-pay | Admitting: Cardiology

## 2019-03-29 DIAGNOSIS — E782 Mixed hyperlipidemia: Secondary | ICD-10-CM

## 2019-03-31 NOTE — Telephone Encounter (Signed)
Please fill

## 2019-05-19 ENCOUNTER — Ambulatory Visit: Payer: BLUE CROSS/BLUE SHIELD | Admitting: Cardiology

## 2019-08-31 ENCOUNTER — Other Ambulatory Visit: Payer: Self-pay | Admitting: Cardiology

## 2019-11-03 ENCOUNTER — Other Ambulatory Visit: Payer: Self-pay

## 2019-11-03 MED ORDER — METOPROLOL SUCCINATE ER 25 MG PO TB24: 25.0000 mg | ORAL_TABLET | Freq: Two times a day (BID) | ORAL | 0 refills | Status: DC

## 2019-11-26 ENCOUNTER — Other Ambulatory Visit: Payer: Self-pay

## 2019-11-26 DIAGNOSIS — E782 Mixed hyperlipidemia: Secondary | ICD-10-CM

## 2019-12-16 ENCOUNTER — Other Ambulatory Visit: Payer: Self-pay | Admitting: Cardiology

## 2019-12-16 DIAGNOSIS — I209 Angina pectoris, unspecified: Secondary | ICD-10-CM

## 2019-12-16 MED ORDER — METOPROLOL SUCCINATE ER 50 MG PO TB24: 50.0000 mg | ORAL_TABLET | Freq: Every day | ORAL | 3 refills | Status: DC

## 2019-12-16 NOTE — Progress Notes (Signed)
CHanged metoprolol succinate 25 mg BID to 50 mg daily for [facility]

## 2019-12-17 ENCOUNTER — Telehealth: Payer: Self-pay

## 2019-12-17 NOTE — Telephone Encounter (Signed)
LMOVM about change to metoprolol per Dr. Jacinto Halim, MD

## 2019-12-17 NOTE — Telephone Encounter (Signed)
-----   Message from Yates Decamp, MD sent at 12/16/2019  2:00 PM EST ----- Regarding: Medication change I changed Metoprolol succinate 25 mg BID to 50 mg daily for savings on the cost of the medication. Please let patient know.  JG

## 2020-01-23 ENCOUNTER — Ambulatory Visit: Payer: BC Managed Care – PPO | Attending: Internal Medicine

## 2020-01-23 DIAGNOSIS — Z23 Encounter for immunization: Secondary | ICD-10-CM | POA: Insufficient documentation

## 2020-01-23 NOTE — Progress Notes (Signed)
Covid-19 Vaccination Clinic  Name:  Linda Cardenas    MRN: [id number] DOB: 10-06-1956  01/23/2020  Linda Cardenas was observed post Covid-19 immunization for 15 minutes without incident. She was provided with Vaccine Information Sheet and instruction to access the V-Safe system.   Linda Cardenas was instructed to call 911 with any severe reactions post vaccine: Marland Kitchen Difficulty breathing  . Swelling of face and throat  . A fast heartbeat  . A bad rash all over body  . Dizziness and weakness   Immunizations Administered    Name Date Dose VIS Date Route   Pfizer COVID-19 Vaccine 01/23/2020  3:50 PM 0.3 mL 10/31/2019 Intramuscular   Manufacturer: [employer], [employer]   Lot: [id number]   NDC: [address]-6

## 2020-01-27 ENCOUNTER — Other Ambulatory Visit: Payer: Self-pay

## 2020-01-27 ENCOUNTER — Other Ambulatory Visit: Payer: Self-pay | Admitting: Cardiology

## 2020-02-09 ENCOUNTER — Telehealth: Payer: Self-pay | Admitting: Pulmonary Disease

## 2020-02-09 NOTE — Telephone Encounter (Signed)
Called and spoke with pt's daughter Irfat to get pt scheduled for a consult with MD. Pt has been scheduled for an appt tomorrow 3/23 at 3:30 with Dr. Wynona Neat.  Called pt's PCP office to see if we could get pt's records faxed to our office. Spoke with Rinaldo Cloud to see if we could have pt's records sent to our office and she stated she would fax them over in Dr. Kary Kos attn.  Routing to Lyden who will be working with Dr.  Val Eagle tomorrow 3/23 so she can be on the lookout for the records.

## 2020-02-09 NOTE — Telephone Encounter (Signed)
Patient's daughter, [name], works as Insurance underwriter in [employer].  She has been concerned about her mother's wheezing and asked to arrange for pulmonary consultation.  Please call Ifra at [phone] to arrange for consult visit.  Can be with any of the physicians.  Her PCP is Dr. Orvan July [name] and can request records from this office.

## 2020-02-10 ENCOUNTER — Encounter: Payer: Self-pay | Admitting: Pulmonary Disease

## 2020-02-10 ENCOUNTER — Other Ambulatory Visit: Payer: Self-pay

## 2020-02-10 ENCOUNTER — Ambulatory Visit (INDEPENDENT_AMBULATORY_CARE_PROVIDER_SITE_OTHER): Payer: BC Managed Care – PPO

## 2020-02-10 ENCOUNTER — Ambulatory Visit: Payer: BC Managed Care – PPO | Admitting: Pulmonary Disease

## 2020-02-10 VITALS — BP 98/66 | HR 56 | Temp 97.3°F | Ht 59.0 in | Wt 171.8 lb

## 2020-02-10 DIAGNOSIS — R0602 Shortness of breath: Secondary | ICD-10-CM

## 2020-02-10 MED ORDER — BREO ELLIPTA 200-25 MCG/INH IN AEPB: 1.0000 | INHALATION_SPRAY | Freq: Every day | RESPIRATORY_TRACT | 0 refills | Status: DC

## 2020-02-10 MED ORDER — BREO ELLIPTA 100-25 MCG/INH IN AEPB: 1.0000 | INHALATION_SPRAY | Freq: Every day | RESPIRATORY_TRACT | 5 refills | Status: DC

## 2020-02-10 MED ORDER — BREO ELLIPTA 100-25 MCG/INH IN AEPB: 1.0000 | INHALATION_SPRAY | Freq: Every day | RESPIRATORY_TRACT | 0 refills | Status: DC

## 2020-02-10 NOTE — Patient Instructions (Signed)
Shortness of breath on exertion Wheezing  -Continue albuterol up to 4 times a day as needed -Breo daily, will provide you a sample -Chest x-ray today  I will see you in about 8 weeks  If symptoms are better, then no further testing If not better, breathing study will be ordered  Discuss with your primary doctor about your metoprolol-your heart rate was a little slow on your visit today-dose may need adjusted

## 2020-02-10 NOTE — Progress Notes (Signed)
Subjective:    Patient ID: Linda Cardenas, female    DOB: 12-24-1955, 64 y.o.   MRN: [id number]  About a year history of shortness of breath  She does have wheezing with activity Has been using albuterol  Has no underlying lung disease No history of smoking No significant exposure to secondhand smoke  Usually able to tolerate mild to moderate activity Wheezes on a daily basis  Denies any other associated symptoms   Past Medical History:  Diagnosis Date  . Arthritis   . Back pain     CHRONIC LOW BACK PAIN - GETS INJECTIONS IN BACK  . Constipation   . Eczema   . Elevated liver enzymes   . Gallstones   . GERD (gastroesophageal reflux disease)   . Hyperlipidemia   . Hypothyroidism   . Lactose intolerance   . Shortness of breath   . Thyroid disease    Social History   Socioeconomic History  . Marital status: Married    Spouse name: Not on file  . Number of children: 2  . Years of education: Not on file  . Highest education level: Not on file  Occupational History  . Not on file  Tobacco Use  . Smoking status: Never Smoker  . Smokeless tobacco: Never Used  Substance and Sexual Activity  . Alcohol use: No  . Drug use: No  . Sexual activity: Not on file  Other Topics Concern  . Not on file  Social History Narrative  . Not on file   Social Determinants of Health   Financial Resource Strain:   . Difficulty of Paying Living Expenses:   Food Insecurity:   . Worried About Programme researcher, broadcasting/film/video in the Last Year:   . Barista in the Last Year:   Transportation Needs:   . Freight forwarder (Medical):   Marland Kitchen Lack of Transportation (Non-Medical):   Physical Activity:   . Days of Exercise per Week:   . Minutes of Exercise per Session:   Stress:   . Feeling of Stress :   Social Connections:   . Frequency of Communication with Friends and Family:   . Frequency of Social Gatherings with Friends and Family:   . Attends Religious Services:   . Active Member  of Clubs or Organizations:   . Attends Banker Meetings:   Marland Kitchen Marital Status:   Intimate Partner Violence:   . Fear of Current or Ex-Partner:   . Emotionally Abused:   Marland Kitchen Physically Abused:   . Sexually Abused:    Family History  Problem Relation Age of Onset  . Heart disease Father   . Heart attack Father   . Heart failure Mother   . CAD Brother    Review of Systems  Constitutional: Negative for fever and unexpected weight change.  HENT: Positive for rhinorrhea. Negative for congestion, dental problem, ear pain, nosebleeds, postnasal drip, sinus pressure, sneezing, sore throat and trouble swallowing.   Eyes: Negative for redness and itching.  Respiratory: Positive for shortness of breath. Negative for cough, chest tightness and wheezing.   Cardiovascular: Negative for palpitations and leg swelling.  Gastrointestinal: Negative for nausea and vomiting.  Genitourinary: Negative for dysuria.  Musculoskeletal: Negative for joint swelling.  Skin: Negative for rash.  Allergic/Immunologic: Negative.  Negative for environmental allergies, food allergies and immunocompromised state.  Neurological: Negative for headaches.  Hematological: Does not bruise/bleed easily.  Psychiatric/Behavioral: Negative for dysphoric mood. The patient is not nervous/anxious.  Objective:   Physical Exam Constitutional:      Appearance: Normal appearance.  HENT:     Head: Normocephalic.     Nose: Nose normal. No congestion.     Mouth/Throat:     Mouth: Mucous membranes are moist.  Eyes:     Pupils: Pupils are equal, round, and reactive to light.  Cardiovascular:     Rate and Rhythm: Normal rate and regular rhythm.     Pulses: Normal pulses.     Heart sounds: Normal heart sounds. No murmur. No friction rub.  Pulmonary:     Effort: Pulmonary effort is normal. No respiratory distress.     Breath sounds: Normal breath sounds. No stridor. No wheezing or rhonchi.  Musculoskeletal:      Cervical back: Normal range of motion and neck supple. No rigidity or tenderness.  Neurological:     General: No focal deficit present.     Mental Status: She is alert.  Psychiatric:        Mood and Affect: Mood normal.    Vitals:   02/10/20 1541  BP: 98/66  Pulse: (!) 56  Temp: (!) 97.3 F (36.3 C)  SpO2: 97%   Chest x-ray today shows no acute infiltrate, kyphosis   Assessment & Plan:  .  Shortness of breath .  Wheezing  Some benefit with use of albuterol  Plan Add Breo 100 -Samples provided  Continue albuterol as needed  Chest x-ray with no acute infiltrate  We will consider pulmonary function testing if symptoms are not controlled  Importance of graded exercises discussed  Heart rate noted to be 56 today-this may be an effect of beta-blockade, Dose may need adjusted.

## 2020-02-19 ENCOUNTER — Telehealth: Payer: Self-pay | Admitting: Pulmonary Disease

## 2020-02-19 NOTE — Telephone Encounter (Signed)
ATC Patient.  LM to call back. 

## 2020-02-19 NOTE — Telephone Encounter (Signed)
Looks like pt already received her cxr results- unsure why she is calling back- LMTCB for Irfat.

## 2020-02-19 NOTE — Telephone Encounter (Signed)
Called and spoke with Patient, and her Daughter Linda Cardenas.  Patient has enough Breo samples, and a prescription for pharmacy. Breo, 1 puff daily instructions, and rinse mouth afterwards given. Patient stated Virgel Bouquet is working very well.  Linda Cardenas stated she noticed Patient breathing better, with no wheezing. Nothing further at this time.

## 2020-02-19 NOTE — Telephone Encounter (Signed)
ATC patient and Patient's Daughter, Irfat.  LM to call back.

## 2020-02-23 ENCOUNTER — Ambulatory Visit: Payer: BC Managed Care – PPO

## 2020-02-23 NOTE — Telephone Encounter (Signed)
Called and spoke to pt's daughter (on dpr). She states the pt is doing well on the [facility] and wanted to go over the instructions from the visit. Informed her of the instructions of the AVS. Pt's daughter verbalized understanding and denied any further questions or concerns at this time.

## 2020-02-24 ENCOUNTER — Ambulatory Visit: Payer: BC Managed Care – PPO | Attending: Internal Medicine

## 2020-02-24 DIAGNOSIS — Z23 Encounter for immunization: Secondary | ICD-10-CM

## 2020-02-24 NOTE — Progress Notes (Signed)
Covid-19 Vaccination Clinic  Name:  Randy Rita    MRN: [id number] DOB: Jan 02, 1956  02/24/2020  Ms. Gellerman was observed post Covid-19 immunization for 15 minutes without incident. She was provided with Vaccine Information Sheet and instruction to access the V-Safe system.   Ms. Jakupovic was instructed to call 911 with any severe reactions post vaccine: Marland Kitchen Difficulty breathing  . Swelling of face and throat  . A fast heartbeat  . A bad rash all over body  . Dizziness and weakness   Immunizations Administered    Name Date Dose VIS Date Route   Pfizer COVID-19 Vaccine 02/24/2020 12:36 PM 0.3 mL 10/31/2019 Intramuscular   Manufacturer: [employer], [employer]   Lot: [id number]   NDC: [address]-2

## 2020-03-22 ENCOUNTER — Other Ambulatory Visit (HOSPITAL_BASED_OUTPATIENT_CLINIC_OR_DEPARTMENT_OTHER): Payer: Self-pay | Admitting: Family Medicine

## 2020-03-22 DIAGNOSIS — M545 Low back pain, unspecified: Secondary | ICD-10-CM

## 2020-03-22 DIAGNOSIS — M4800 Spinal stenosis, site unspecified: Secondary | ICD-10-CM

## 2020-04-03 ENCOUNTER — Other Ambulatory Visit: Payer: Self-pay

## 2020-04-03 ENCOUNTER — Ambulatory Visit (HOSPITAL_BASED_OUTPATIENT_CLINIC_OR_DEPARTMENT_OTHER)
Admission: RE | Admit: 2020-04-03 | Discharge: 2020-04-03 | Disposition: A | Discharge status: Home / Self Care | Payer: BC Managed Care – PPO | Source: Ambulatory Visit | Attending: Family Medicine | Admitting: Family Medicine

## 2020-04-03 DIAGNOSIS — M545 Low back pain, unspecified: Secondary | ICD-10-CM

## 2020-04-03 DIAGNOSIS — M4800 Spinal stenosis, site unspecified: Secondary | ICD-10-CM | POA: Diagnosis present

## 2020-04-03 MED ORDER — GADOBUTROL 1 MMOL/ML IV SOLN
7.5000 mL | Freq: Once | INTRAVENOUS | Status: AC | PRN
  Administered 2020-04-03: 7.5 mL via INTRAVENOUS

## 2020-04-08 ENCOUNTER — Other Ambulatory Visit: Payer: Self-pay

## 2020-04-08 ENCOUNTER — Encounter: Payer: Self-pay | Admitting: Pulmonary Disease

## 2020-04-08 ENCOUNTER — Ambulatory Visit: Payer: BC Managed Care – PPO | Admitting: Pulmonary Disease

## 2020-04-08 VITALS — BP 106/62 | HR 60 | Temp 97.5°F | Ht 59.0 in | Wt 170.2 lb

## 2020-04-08 DIAGNOSIS — R0602 Shortness of breath: Secondary | ICD-10-CM | POA: Diagnosis not present

## 2020-04-08 MED ORDER — BREO ELLIPTA 100-25 MCG/INH IN AEPB: 1.0000 | INHALATION_SPRAY | Freq: Every day | RESPIRATORY_TRACT | 5 refills | Status: DC

## 2020-04-08 NOTE — Patient Instructions (Signed)
Improvement in symptoms with use of Breo  Continue Breo once a day  If symptoms not as controlled on Breo 100, may be switched to Breo 200-I will leave as is at present  Call with significant concerns  Follow-up in 6 months

## 2020-04-08 NOTE — Addendum Note (Signed)
Addended by: Camila Li on: 04/08/2020 03:37 PM   Modules accepted: Orders

## 2020-04-08 NOTE — Progress Notes (Signed)
Subjective:    Patient ID: Linda Cardenas, female    DOB: 12-07-1955, 64 y.o.   MRN: [id number]  About a year history of shortness of breath  She does have wheezing with activity-wheezing is a lot better Symptoms much improved with use of Breo 100  Has no underlying lung disease No history of smoking No significant exposure to secondhand smoke  Usually able to tolerate mild to moderate activity Wheezes on a daily basis  Denies any other associated symptoms  She is more limited with chronic back pain than breathing recently   Past Medical History:  Diagnosis Date  . Arthritis   . Back pain     CHRONIC LOW BACK PAIN - GETS INJECTIONS IN BACK  . Constipation   . Eczema   . Elevated liver enzymes   . Gallstones   . GERD (gastroesophageal reflux disease)   . Hyperlipidemia   . Hypothyroidism   . Lactose intolerance   . Shortness of breath   . Thyroid disease    Social History   Socioeconomic History  . Marital status: Married    Spouse name: Not on file  . Number of children: 2  . Years of education: Not on file  . Highest education level: Not on file  Occupational History  . Not on file  Tobacco Use  . Smoking status: Never Smoker  . Smokeless tobacco: Never Used  Substance and Sexual Activity  . Alcohol use: No  . Drug use: No  . Sexual activity: Not on file  Other Topics Concern  . Not on file  Social History Narrative  . Not on file   Social Determinants of Health   Financial Resource Strain:   . Difficulty of Paying Living Expenses:   Food Insecurity:   . Worried About Programme researcher, broadcasting/film/video in the Last Year:   . Barista in the Last Year:   Transportation Needs:   . Freight forwarder (Medical):   Marland Kitchen Lack of Transportation (Non-Medical):   Physical Activity:   . Days of Exercise per Week:   . Minutes of Exercise per Session:   Stress:   . Feeling of Stress :   Social Connections:   . Frequency of Communication with Friends and Family:    . Frequency of Social Gatherings with Friends and Family:   . Attends Religious Services:   . Active Member of Clubs or Organizations:   . Attends Banker Meetings:   Marland Kitchen Marital Status:   Intimate Partner Violence:   . Fear of Current or Ex-Partner:   . Emotionally Abused:   Marland Kitchen Physically Abused:   . Sexually Abused:    Family History  Problem Relation Age of Onset  . Heart disease Father   . Heart attack Father   . Heart failure Mother   . CAD Brother    Review of Systems  Constitutional: Negative for fever and unexpected weight change.  HENT: Negative for congestion, dental problem, ear pain, nosebleeds, postnasal drip, rhinorrhea, sinus pressure, sneezing, sore throat and trouble swallowing.   Eyes: Negative for redness and itching.  Respiratory: Positive for shortness of breath. Negative for cough, chest tightness and wheezing.   Cardiovascular: Negative for palpitations.  Genitourinary: Negative for dysuria.  Skin: Negative for rash.  Allergic/Immunologic: Negative.       Objective:   Physical Exam Constitutional:      Appearance: Normal appearance.  HENT:     Head: Normocephalic.  Nose: Nose normal. No congestion.     Mouth/Throat:     Mouth: Mucous membranes are moist.  Eyes:     Pupils: Pupils are equal, round, and reactive to light.  Cardiovascular:     Rate and Rhythm: Normal rate and regular rhythm.     Pulses: Normal pulses.     Heart sounds: Normal heart sounds. No murmur. No friction rub.  Pulmonary:     Effort: Pulmonary effort is normal. No respiratory distress.     Breath sounds: Normal breath sounds. No stridor. No wheezing or rhonchi.  Neurological:     Mental Status: She is alert.    Vitals:   04/08/20 1513  BP: 106/62  Pulse: 60  Temp: (!) 97.5 F (36.4 C)  SpO2: 96%   Chest x-ray today shows no acute infiltrate, kyphosis  -Reviewed with patient today  Assessment & Plan:  .  Shortness of breath .   Wheezing  Significant improvement with Breo 100  We will continue Breo 100 at present and if not as controlled may consider increasing to Breo 200 once a day  Continue using albuterol as needed  Plan  Continue albuterol as needed  Breo 100  We will consider pulmonary function testing if symptoms are not controlled  Importance of graded exercises discussed  Follow-up in 6 months  Call with significant concerns

## 2020-08-18 ENCOUNTER — Telehealth: Payer: Self-pay | Admitting: Pulmonary Disease

## 2020-08-18 NOTE — Telephone Encounter (Signed)
Spoke with the pt  I advised unfortunately we do not have any samples of Breo right now  She states that her copay for Breo 100 if $99 and she can not afford this  Will send to the pharm team to see if they can check for covered alternatives  Please advise thanks!

## 2020-08-19 NOTE — Telephone Encounter (Signed)
Based on the information from the test claims, Dr. Val Eagle, please advise on this.

## 2020-08-19 NOTE — Telephone Encounter (Signed)
Test claims ran for 1 month supply. Claims did not reveal if deductible was being applied, but is likely the case.  Advair Diskus- $76.86 Wixela- not covered Advair hfa- $98.34 Symbicort- $90.09 Generic- not covered Dulera- non-form Airduo- non-form- Airduo is $33 with Goodrx coupon at [employer].

## 2020-08-20 ENCOUNTER — Other Ambulatory Visit: Payer: Self-pay | Admitting: Pulmonary Disease

## 2020-08-20 MED ORDER — FLUTICASONE-SALMETEROL 113-14 MCG/ACT IN AEPB: 1.0000 | INHALATION_SPRAY | Freq: Two times a day (BID) | RESPIRATORY_TRACT | 5 refills | Status: DC

## 2020-08-20 NOTE — Telephone Encounter (Signed)
Pharmacy comment: Alternative Requested:THE PRESCRIBED MEDICATION IS NOT COVERED BY INSURANCE. PLEASE CONSIDER CHANGING TO ONE OF THE SUGGESTED COVERED ALTERNATIVES.   All Pharmacy Suggested Alternatives:   budesonide-formoterol (SYMBICORT) 160-4.5 MCG/ACT inhaler Fluticasone-Salmeterol (ADVAIR DISKUS) 250-50 MCG/DOSE AEPB fluticasone furoate-vilanterol (BREO ELLIPTA) 100-25 MCG/INH AEPB  To prescribe one of the alternatives listed above, open the encounter and click Replace.  Open Encounter  Pt needs alternative sent to pharmacy nolonger covered by insurancce symbicort

## 2020-08-20 NOTE — Telephone Encounter (Signed)
Spoke with patient. She is aware of AO's recommendations. While on the phone with her, I looked to see if the AirDuo or Advair would be covered by her insurance. Advair is not covered but did get a message that AirDuo would be covered after a PA. Advised her that I would go ahead and start the PA.  [facility] [address]   PA was started on [email or web address]. Key is [id number]. Will check back later for determination.

## 2020-08-20 NOTE — Telephone Encounter (Signed)
May offer patient air duo 113/14 , 1 puff every 12 or Advair Diskus, Advair 250, 1 puff every 12  Based off of list of course

## 2020-08-23 MED ORDER — BUDESONIDE-FORMOTEROL FUMARATE 160-4.5 MCG/ACT IN AERO: 2.0000 | INHALATION_SPRAY | Freq: Two times a day (BID) | RESPIRATORY_TRACT | 6 refills | Status: DC

## 2020-08-23 NOTE — Telephone Encounter (Signed)
Pt was informed of change in inhaler that witll be covered by insurance either air duo  Or advair 250

## 2020-08-23 NOTE — Addendum Note (Signed)
Addended by: Malaiah Viramontes R on: 08/23/2020 02:41 PM   Modules accepted: Orders  

## 2020-08-24 NOTE — Telephone Encounter (Signed)
Additional information was needed for PA. Will await determination.

## 2020-08-26 NOTE — Telephone Encounter (Signed)
PA has been denied. I looked further in note and noticed the AirDuo is $33 with the use of a GoodRx card, this means insurance is not used when a GoodRx card is presented. The pt would ask pharmacy to not run insurance when picking up this medication but rather use a GoodRx card to take advantage of the discount.   LMTCB for pt. If pt is ok with this amount we can send in the AirDuo and let her know to tell the pharmacy to not run insurance and to use a GoodRx coupon.

## 2020-08-31 NOTE — Telephone Encounter (Signed)
Lmtcb for pt.  

## 2020-09-01 MED ORDER — AIRDUO DIGIHALER 113-14 MCG/ACT IN AEPB: 1.0000 | INHALATION_SPRAY | Freq: Two times a day (BID) | RESPIRATORY_TRACT | 11 refills | Status: DC

## 2020-09-01 NOTE — Telephone Encounter (Signed)
Spoke with the pt and notified of below information per Robynn Pane  She states will go to [employer] and get coupon for Airduo  I have sent her rx to pharmacy

## 2020-09-01 NOTE — Telephone Encounter (Signed)
Pt is returning call - CB# [phone]

## 2020-10-02 IMAGING — DX DG CHEST 2V
2 series · 2 of 2 positions shown · non-contrast
Comparison: 12/31/2018

CLINICAL DATA: Shortness of breath.

EXAM:
CHEST - 2 VIEW

[chest pa]
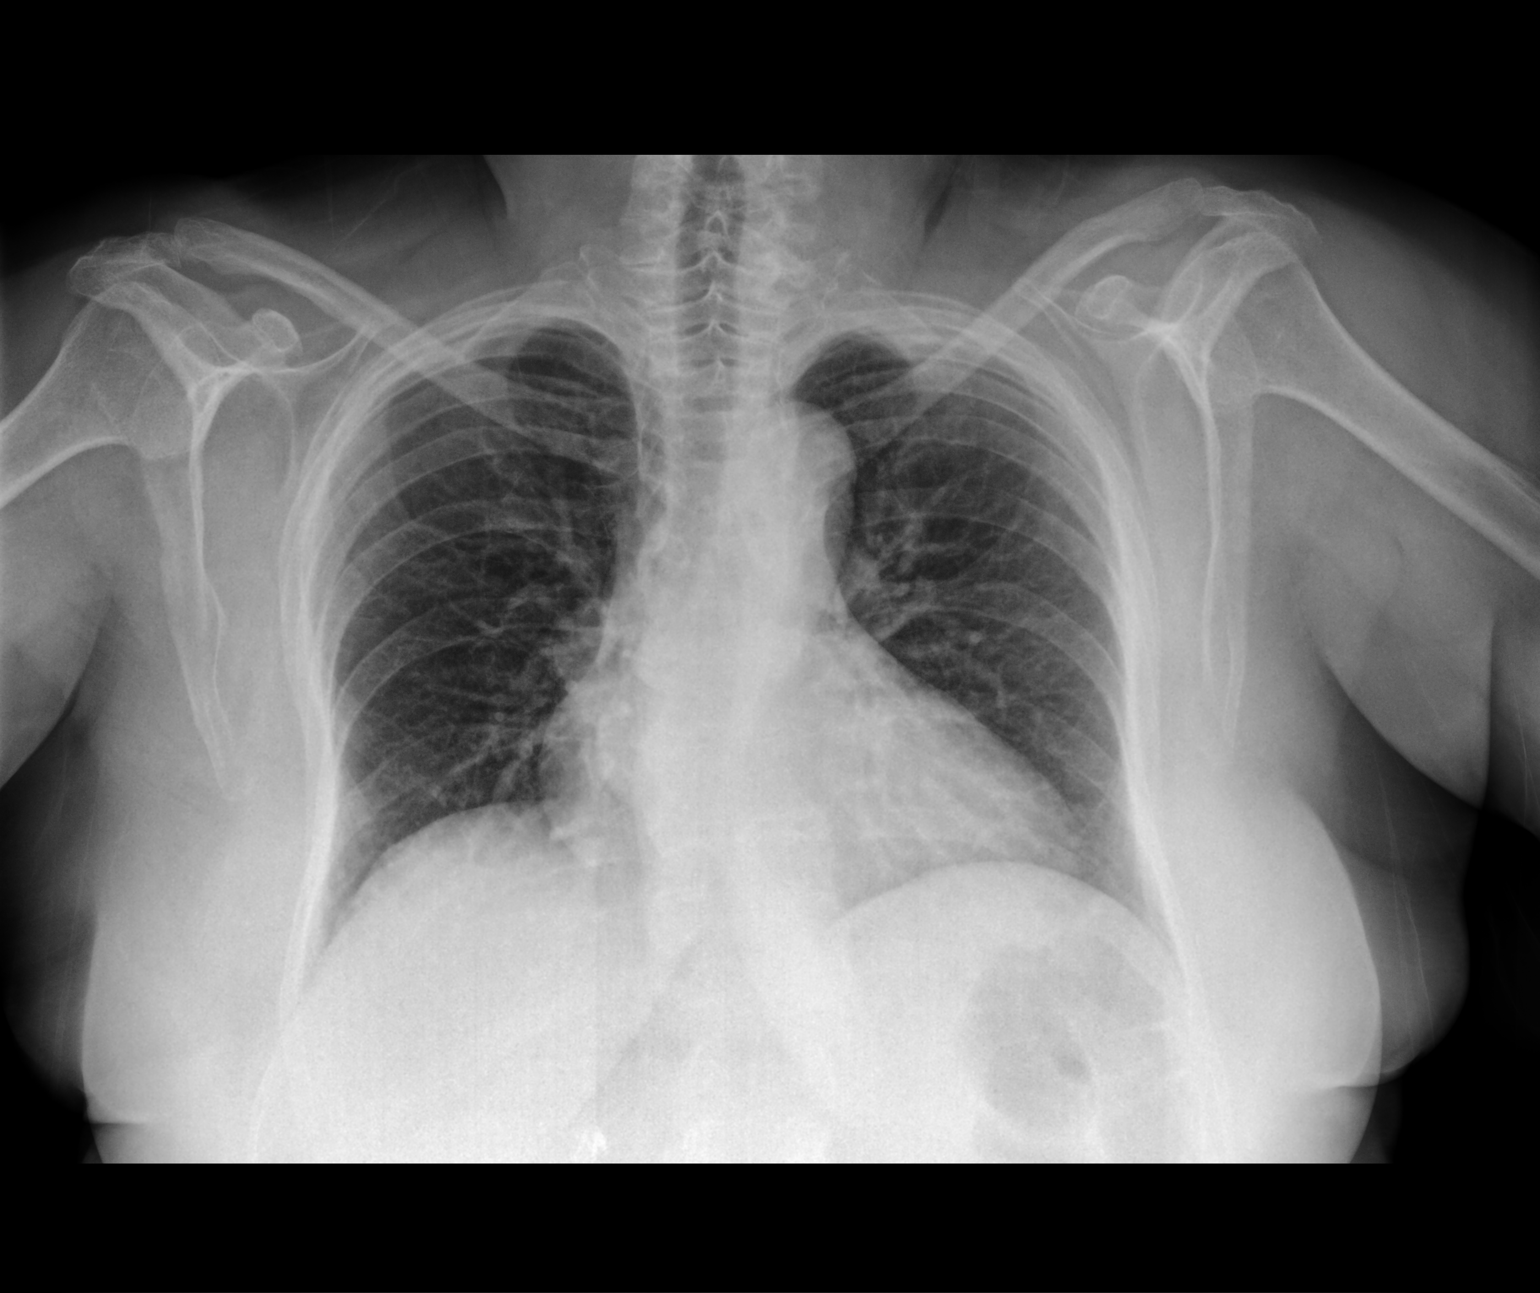

[chest lat]
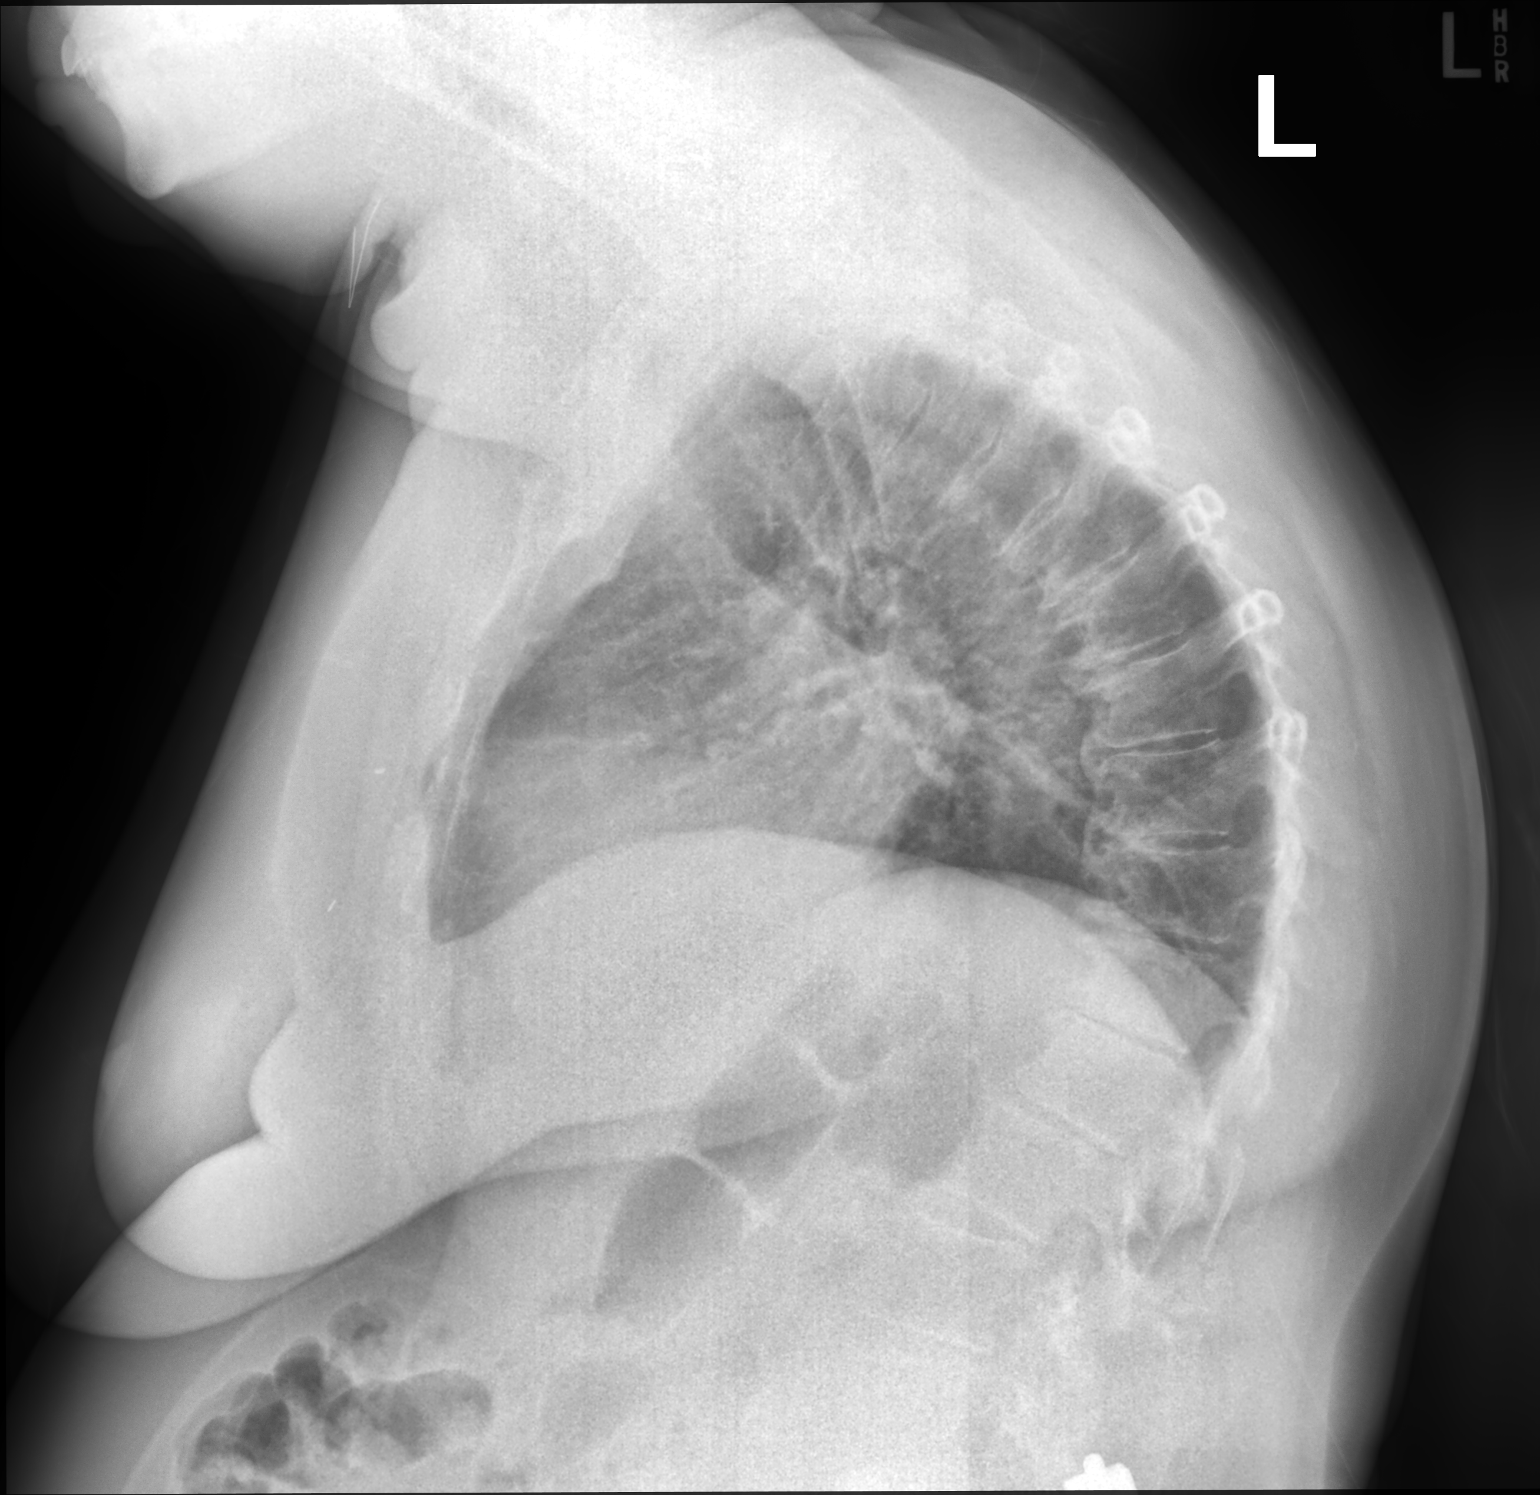

[2 of 2 positions shown; findings below may reference images not displayed]

FINDINGS: Cardiomediastinal contours are mildly enlarged. Hilar structures are
normal.

Lungs are clear.

Extensive spinal degenerative changes with kyphotic deformity of the
thoracic spine similar to previous exam.
IMPRESSION: No acute cardiopulmonary disease.

## 2020-12-15 ENCOUNTER — Ambulatory Visit: Payer: BC Managed Care – PPO | Admitting: Orthopaedic Surgery

## 2020-12-16 ENCOUNTER — Ambulatory Visit: Payer: Self-pay

## 2020-12-16 ENCOUNTER — Ambulatory Visit (INDEPENDENT_AMBULATORY_CARE_PROVIDER_SITE_OTHER): Payer: BC Managed Care – PPO | Admitting: Orthopaedic Surgery

## 2020-12-16 ENCOUNTER — Encounter: Payer: Self-pay | Admitting: Orthopaedic Surgery

## 2020-12-16 ENCOUNTER — Other Ambulatory Visit: Payer: Self-pay

## 2020-12-16 DIAGNOSIS — M25562 Pain in left knee: Secondary | ICD-10-CM | POA: Diagnosis not present

## 2020-12-16 DIAGNOSIS — M25462 Effusion, left knee: Secondary | ICD-10-CM

## 2020-12-16 MED ORDER — METHYLPREDNISOLONE ACETATE 40 MG/ML IJ SUSP
40.0000 mg | INTRAMUSCULAR | Status: AC | PRN
Start: 2020-12-16 — End: 2020-12-16
  Administered 2020-12-16: 40 mg via INTRA_ARTICULAR

## 2020-12-16 MED ORDER — LIDOCAINE HCL 1 % IJ SOLN
3.0000 mL | INTRAMUSCULAR | Status: AC | PRN
Start: 2020-12-16 — End: 2020-12-16
  Administered 2020-12-16: 3 mL

## 2020-12-16 NOTE — Progress Notes (Signed)
Office Visit Note   Patient: Linda Cardenas           Date of Birth: 07-Aug-1956           MRN: [id number] Visit Date: 12/16/2020              Requested by: Ileana Ladd, MD [id number] [place],  [place] [address] PCP: Ileana Ladd, MD   Assessment & Plan: Visit Diagnoses:  1. Left knee pain, unspecified chronicity   2. Effusion, left knee     Plan: I was able to aspirate 40 cc of clear yellow fluid from the left knee consistent with arthritis and synovitis.  I then place a steroid injection in that knee.  I recommended alternating ibuprofen and Tylenol arthritis for pain and inflammation.  I would like to see her back in 3 weeks for an evaluation and follow-up.  She may be a good candidate for hyaluronic acid for that knee.  All questions and concerns were answered and addressed.  Also recommended a cane to use temporarily in her opposite hand.  Follow-Up Instructions: Return in about 3 weeks (around 01/06/2021).   Orders:  Orders Placed This Encounter  Procedures  . Large Joint Inj  . XR KNEE 3 VIEW LEFT   No orders of the defined types were placed in this encounter.     Procedures: Large Joint Inj: L knee on 12/16/2020 4:25 PM Indications: diagnostic evaluation and pain Details: 22 G 1.5 in needle, superolateral approach  Arthrogram: No  Medications: 3 mL lidocaine 1 %; 40 mg methylPREDNISolone acetate 40 MG/ML Outcome: tolerated well, no immediate complications Procedure, treatment alternatives, risks and benefits explained, specific risks discussed. Consent was given by the patient. Immediately prior to procedure a time out was called to verify the correct patient, procedure, equipment, support staff and site/side marked as required. Patient was prepped and draped in the usual sterile fashion.       Clinical Data: No additional findings.   Subjective: Chief Complaint  Patient presents with  . Left Knee - Pain  The patient is a very pleasant  65 year old female who comes in for evaluation treatment of acute left knee pain is been hurting for just over a month.  She is also had some swelling in that knee as well.  Her daughter is a Engineer, civil (consulting) who I know well.  I have actually seen this patient's husband as well and he is with her today.  I replaced one of his knees.  She has never had surgery on her knee or injections.  She does not report any type of injury at all to this knee but has been hurting her with weightbearing and at night as well.  She does can report swelling of that knee.  She is not a diabetic.  HPI  Review of Systems She currently denies any headache, chest pain, shortness of breath, fever, chills, nausea, vomiting  Objective: Vital Signs: There were no vitals taken for this visit.  Physical Exam She is alert and orient x3 and in no acute distress.  She walks very slowly and does seem to be favoring her knees. Ortho Exam Examination of her left knee does show a moderate effusion.  She has varus malalignment that knee and global tenderness throughout the arc of motion of the knee and to palpation of the knee globally.  The knee feels ligamentously stable but it is painful again throughout its arc of motion. Specialty Comments:  No specialty  comments available.  Imaging: XR KNEE 3 VIEW LEFT  Result Date: 12/16/2020 3 views the left knee show moderate arthritis mainly at the patellofemoral joint and some medial joint space narrowing.  There are para-articular osteophytes in all 3 compartments with slight varus malalignment.  There is a mild effusion.    PMFS History: Patient Active Problem List   Diagnosis Date Noted  . Cholelithiasis with cholecystitis 12/11/2012   Past Medical History:  Diagnosis Date  . Arthritis   . Back pain     CHRONIC LOW BACK PAIN - GETS INJECTIONS IN BACK  . Constipation   . Eczema   . Elevated liver enzymes   . Gallstones   . GERD (gastroesophageal reflux disease)   . Hyperlipidemia    . Hypothyroidism   . Lactose intolerance   . Shortness of breath   . Thyroid disease     Family History  Problem Relation Age of Onset  . Heart disease Father   . Heart attack Father   . Heart failure Mother   . CAD Brother     Past Surgical History:  Procedure Laterality Date  . BREAST SURGERY     "PIMPLE" REMOVED FROM BREAST IN OFFICE  . CESAREAN SECTION    . CHOLECYSTECTOMY  12/26/2012   Procedure: LAPAROSCOPIC CHOLECYSTECTOMY WITH INTRAOPERATIVE CHOLANGIOGRAM;  Surgeon: Velora Heckler, MD;  Location: [facility];  Service: General;  Laterality: N/A;  . LUMBAR FUSION N/A L4/5 04/2013   Social History   Occupational History  . Not on file  Tobacco Use  . Smoking status: Never Smoker  . Smokeless tobacco: Never Used  Vaping Use  . Vaping Use: Never used  Substance and Sexual Activity  . Alcohol use: No  . Drug use: No  . Sexual activity: Not on file

## 2020-12-31 ENCOUNTER — Telehealth: Payer: Self-pay | Admitting: Orthopaedic Surgery

## 2020-12-31 ENCOUNTER — Other Ambulatory Visit: Payer: Self-pay | Admitting: Orthopaedic Surgery

## 2020-12-31 MED ORDER — ACETAMINOPHEN-CODEINE #3 300-30 MG PO TABS: 1.0000 | ORAL_TABLET | Freq: Three times a day (TID) | ORAL | 0 refills | Status: DC | PRN

## 2020-12-31 NOTE — Telephone Encounter (Signed)
Patient called. Says she is still in pain and her knee is swollen. [facility] Her call back number is [phone]

## 2021-01-05 ENCOUNTER — Ambulatory Visit (HOSPITAL_COMMUNITY)
Admission: RE | Admit: 2021-01-05 | Discharge: 2021-01-05 | Disposition: A | Discharge status: Home / Self Care | Payer: BC Managed Care – PPO | Source: Ambulatory Visit | Attending: Orthopaedic Surgery | Admitting: Orthopaedic Surgery

## 2021-01-05 ENCOUNTER — Other Ambulatory Visit: Payer: Self-pay

## 2021-01-05 ENCOUNTER — Telehealth: Payer: Self-pay

## 2021-01-05 ENCOUNTER — Encounter: Payer: Self-pay | Admitting: Orthopaedic Surgery

## 2021-01-05 ENCOUNTER — Ambulatory Visit (INDEPENDENT_AMBULATORY_CARE_PROVIDER_SITE_OTHER): Payer: BC Managed Care – PPO | Admitting: Orthopaedic Surgery

## 2021-01-05 DIAGNOSIS — M25462 Effusion, left knee: Secondary | ICD-10-CM | POA: Diagnosis not present

## 2021-01-05 DIAGNOSIS — M7989 Other specified soft tissue disorders: Secondary | ICD-10-CM

## 2021-01-05 DIAGNOSIS — M25562 Pain in left knee: Secondary | ICD-10-CM

## 2021-01-05 DIAGNOSIS — M79605 Pain in left leg: Secondary | ICD-10-CM | POA: Insufficient documentation

## 2021-01-05 MED ORDER — DICLOFENAC SODIUM 75 MG PO TBEC: 75.0000 mg | DELAYED_RELEASE_TABLET | Freq: Two times a day (BID) | ORAL | 1 refills | Status: DC | PRN

## 2021-01-05 NOTE — Progress Notes (Signed)
The patient comes today for continued follow-up for her left knee.  3 weeks ago I aspirated a large effusion from her knee on the left side and place a steroid injection in the knee.  She has had a reaccumulation of her effusion.  Her left calf as well as foot and ankle has been swollen as well.  She is walking with a limp.  She has never injured this knee before and had not had problems previously with her knee.  She has been reluctant to take anti-inflammatories or Tylenol.  Examination of her left knee today does still show an effusion.  Her calf is also tight and her foot and ankle are swollen.  She has a painful range of motion of her knee and pain in the calf to palpation.  At this point a Doppler ultrasound of the left lower extremity rule out DVT is warranted.  We also need to obtain MRI of her left knee to assess for stress fracture or rule out meniscal tear due to the recurrent effusion.  I have recommended to her daughter that she get a compressive TED hose for that left lower extremity in the interim.  I will also send in some diclofenac to take twice a day for pain and inflammation.  She will follow up after the MRI of her left knee and we will make recommendations also depending on the findings of the ultrasound rule out DVT.

## 2021-01-05 NOTE — Telephone Encounter (Signed)
Ernestene Mention from [facility] called and states patient is Negative for DVT.   [phone]

## 2021-01-05 NOTE — Telephone Encounter (Signed)
Linda Cardenas aware

## 2021-01-05 NOTE — CV Procedure (Signed)
LLE venous duplex completed. Preliminary findings given to Marisue Ivan, RMA at Dr. Eliberto Ivory office.  Results can be found under chart review under CV PROC. 01/05/2021 3:05 PM Desire Fulp RVT, RDMS

## 2021-01-21 ENCOUNTER — Other Ambulatory Visit: Payer: Self-pay

## 2021-01-21 ENCOUNTER — Ambulatory Visit
Admission: RE | Admit: 2021-01-21 | Discharge: 2021-01-21 | Disposition: A | Discharge status: Home / Self Care | Payer: BC Managed Care – PPO | Source: Ambulatory Visit | Attending: Orthopaedic Surgery | Admitting: Orthopaedic Surgery

## 2021-01-21 DIAGNOSIS — M25462 Effusion, left knee: Secondary | ICD-10-CM

## 2021-01-21 DIAGNOSIS — M25562 Pain in left knee: Secondary | ICD-10-CM

## 2021-02-09 ENCOUNTER — Encounter: Payer: Self-pay | Admitting: Orthopaedic Surgery

## 2021-02-09 ENCOUNTER — Telehealth: Payer: Self-pay

## 2021-02-09 ENCOUNTER — Ambulatory Visit (INDEPENDENT_AMBULATORY_CARE_PROVIDER_SITE_OTHER): Payer: BC Managed Care – PPO | Admitting: Orthopaedic Surgery

## 2021-02-09 DIAGNOSIS — M1712 Unilateral primary osteoarthritis, left knee: Secondary | ICD-10-CM

## 2021-02-09 DIAGNOSIS — M25462 Effusion, left knee: Secondary | ICD-10-CM

## 2021-02-09 DIAGNOSIS — G8929 Other chronic pain: Secondary | ICD-10-CM | POA: Diagnosis not present

## 2021-02-09 DIAGNOSIS — M25562 Pain in left knee: Secondary | ICD-10-CM | POA: Diagnosis not present

## 2021-02-09 NOTE — Telephone Encounter (Signed)
Left knee gel injection ?

## 2021-02-09 NOTE — Progress Notes (Signed)
The patient comes in today to go over an MRI of her left knee.  She has had significant left knee pain along the medial joint space and joint line area for some time now.  We have tried activity modification.  She is been on oral anti-inflammatories including diclofenac.  She tried Voltaren gel.  She is try to walk with assist device.  She is worked on Dance movement psychotherapist exercises as well.  We also provide a steroid injection in that left knee.  Surprisingly the plain films showed still maintained joint space.  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  There is also mild to moderate arthritis and cartilage thinning of the lateral compartment and the patellofemoral joint.  There is mucoid degeneration of the ACL and a degenerative medial meniscal tear.  There is also subchondral edema in the medial tibial plateau.  Her knee does have slight varus malalignment of the left side.  There is significant medial joint line tenderness.  At this point there are not good treatment options other than at least trying hyaluronic acid as conservative treatment measures given the failure of all the conservative treatment measures including activity modification and those mentioned above including anti-inflammatories and a steroid injection.  We will see if we can get this approved for her knee.  She will continue oral anti-inflammatories and activity modification.  We will see her back hopefully to provide hyaluronic acid injection into her left knee.  All question concerns were answered and addressed.

## 2021-02-10 NOTE — Telephone Encounter (Signed)
Noted  

## 2021-02-11 ENCOUNTER — Other Ambulatory Visit: Payer: Self-pay | Admitting: Cardiology

## 2021-02-11 DIAGNOSIS — I209 Angina pectoris, unspecified: Secondary | ICD-10-CM

## 2021-03-14 ENCOUNTER — Telehealth: Payer: Self-pay

## 2021-03-14 NOTE — Telephone Encounter (Signed)
VOB has been submitted for SYnviscOne, left knee. Pending BV.

## 2021-04-08 ENCOUNTER — Telehealth: Payer: Self-pay

## 2021-04-08 NOTE — Telephone Encounter (Signed)
Called and left a VM for patient to CB to schedule an appointment with Dr. Magnus Ivan for gel injection.  Approved for SynviscOne, Left knee. Buy & Bill Patient will be responsible for 20% OOP. No Co-pay No PA required per VickieF.  with BC[employer] Reference# [id number]

## 2021-08-02 ENCOUNTER — Ambulatory Visit: Payer: BC Managed Care – PPO | Admitting: Orthopaedic Surgery

## 2021-08-04 ENCOUNTER — Encounter: Payer: Self-pay | Admitting: Orthopaedic Surgery

## 2021-08-04 ENCOUNTER — Ambulatory Visit: Payer: Self-pay

## 2021-08-04 ENCOUNTER — Other Ambulatory Visit: Payer: Self-pay

## 2021-08-04 ENCOUNTER — Ambulatory Visit (INDEPENDENT_AMBULATORY_CARE_PROVIDER_SITE_OTHER): Payer: BC Managed Care – PPO | Admitting: Orthopaedic Surgery

## 2021-08-04 DIAGNOSIS — M25561 Pain in right knee: Secondary | ICD-10-CM | POA: Diagnosis not present

## 2021-08-04 DIAGNOSIS — M1711 Unilateral primary osteoarthritis, right knee: Secondary | ICD-10-CM

## 2021-08-04 DIAGNOSIS — G8929 Other chronic pain: Secondary | ICD-10-CM

## 2021-08-04 MED ORDER — TRAMADOL HCL 50 MG PO TABS: 50.0000 mg | ORAL_TABLET | Freq: Four times a day (QID) | ORAL | 0 refills | Status: DC | PRN

## 2021-08-04 MED ORDER — METHYLPREDNISOLONE ACETATE 40 MG/ML IJ SUSP
40.0000 mg | INTRAMUSCULAR | Status: AC | PRN
  Administered 2021-08-04: 40 mg via INTRA_ARTICULAR

## 2021-08-04 MED ORDER — LIDOCAINE HCL 1 % IJ SOLN
3.0000 mL | INTRAMUSCULAR | Status: AC | PRN
  Administered 2021-08-04: 3 mL

## 2021-08-04 MED ORDER — DICLOFENAC SODIUM 75 MG PO TBEC: 75.0000 mg | DELAYED_RELEASE_TABLET | Freq: Two times a day (BID) | ORAL | 1 refills | Status: DC | PRN

## 2021-08-04 NOTE — Progress Notes (Signed)
Office Visit Note   Patient: Linda Cardenas           Date of Birth: 08/01/1956           MRN: [id number] Visit Date: 08/04/2021              Requested by: Ileana Ladd, MD [address] [place],  [place] [address] PCP: Ileana Ladd, MD   Assessment & Plan: Visit Diagnoses:  1. Chronic pain of right knee   2. Unilateral primary osteoarthritis, right knee     Plan: She does have moderate arthritis of her right knee and I think that is driving some of her pain.  I did recommend a steroid injection in the right knee today.  Having had one before in her left knee she is fully aware of the risk and benefits of this injection and agreed to have this done.  I will give her a note reflecting that she has been out of work for her knee pain and can resume full work duty starting Monday, September 19.  I will send in some diclofenac and tramadol to take as needed for pain and inflammation.  We will see her back in 4 weeks to see how she doing overall.  All questions and concerns were answered and addressed.  Follow-Up Instructions: Return in about 4 weeks (around 09/01/2021).   Orders:  Orders Placed This Encounter  Procedures   Large Joint Inj   XR KNEE 3 VIEW RIGHT   No orders of the defined types were placed in this encounter.     Procedures: Large Joint Inj: R knee on 08/04/2021 2:32 PM Indications: diagnostic evaluation and pain Details: 22 G 1.5 in needle, superolateral approach  Arthrogram: No  Medications: 3 mL lidocaine 1 %; 40 mg methylPREDNISolone acetate 40 MG/ML Outcome: tolerated well, no immediate complications Procedure, treatment alternatives, risks and benefits explained, specific risks discussed. Consent was given by the patient. Immediately prior to procedure a time out was called to verify the correct patient, procedure, equipment, support staff and site/side marked as required. Patient was prepped and draped in the usual sterile fashion.      Clinical  Data: No additional findings.   Subjective: Chief Complaint  Patient presents with   Right Knee - Pain  The patient comes in today with acute onset of right knee pain that is kept her out of work for a few days.  She is 65 years old and a regular patient of mine.  We had seen her for her left knee and had recommended Synvisc 1 for the left knee.  However she says now the left knee is not hurting her at all.  It is the right knee that is bothering her quite a bit.  She is tried ibuprofen and Tylenol and she has a burning type of pain in that knee and she points to the lateral aspect of the knee.  She has no injury.  She is now diabetic.  It hurts mainly with weightbearing and does wake her up at night.  Some of the pain is in the back of the knee is what she describes with her left knee.  HPI  Review of Systems There is currently no headache, chest pain, shortness of breath, fever, chills, nausea, vomiting  Objective: Vital Signs: There were no vitals taken for this visit.  Physical Exam She is alert and orient x3 and in no acute distress Ortho Exam Examination of her right knee shows  varus malalignment.  There is patellofemoral crepitation and lateral joint line tenderness.  She has good range of motion the knee but is painful in the posterior lateral aspect of the knee with range of motion.  The knee is ligamentously stable. Specialty Comments:  No specialty comments available.  Imaging: No results found.   PMFS History: Patient Active Problem List   Diagnosis Date Noted   Unilateral primary osteoarthritis, left knee 02/09/2021   Cholelithiasis with cholecystitis 12/11/2012   Past Medical History:  Diagnosis Date   Arthritis    Back pain     CHRONIC LOW BACK PAIN - GETS INJECTIONS IN BACK   Constipation    Eczema    Elevated liver enzymes    Gallstones    GERD (gastroesophageal reflux disease)    Hyperlipidemia    Hypothyroidism    Lactose intolerance    Shortness of  breath    Thyroid disease     Family History  Problem Relation Age of Onset   Heart disease Father    Heart attack Father    Heart failure Mother    CAD Brother     Past Surgical History:  Procedure Laterality Date   BREAST SURGERY     "PIMPLE" REMOVED FROM BREAST IN OFFICE   CESAREAN SECTION     CHOLECYSTECTOMY  12/26/2012   Procedure: LAPAROSCOPIC CHOLECYSTECTOMY WITH INTRAOPERATIVE CHOLANGIOGRAM;  Surgeon: Velora Heckler, MD;  Location: [facility];  Service: General;  Laterality: N/A;   LUMBAR FUSION N/A L4/5 04/2013   Social History   Occupational History   Not on file  Tobacco Use   Smoking status: Never   Smokeless tobacco: Never  Vaping Use   Vaping Use: Never used  Substance and Sexual Activity   Alcohol use: No   Drug use: No   Sexual activity: Not on file

## 2021-08-08 ENCOUNTER — Ambulatory Visit: Payer: BC Managed Care – PPO | Admitting: Physician Assistant

## 2021-08-18 ENCOUNTER — Telehealth: Payer: Self-pay | Admitting: Orthopaedic Surgery

## 2021-08-18 NOTE — Telephone Encounter (Signed)
Pt called stating she had an appt on 08/04/21 for knee pain and it hasn't gotten any better. Pt states she taking the rxs Dr.Blackman gave her but it doesn't help and the pain when she's working is almost unbearable. Pt would like a CB to discuss possibly being taken out of work or to be advised of anything she can try to lessen her pain.  [phone]

## 2021-08-19 NOTE — Telephone Encounter (Signed)
Please advise 

## 2021-08-19 NOTE — Telephone Encounter (Signed)
I called pt and advised her. She stated she is out today but will return to work and try the tylenol. She does want to proceed with the gel injection. Can you please work on auth for her?

## 2021-08-23 NOTE — Telephone Encounter (Signed)
Patient previously approved for gel injection in left knee. Okay to proceed

## 2021-09-19 ENCOUNTER — Encounter: Payer: Self-pay | Admitting: Orthopaedic Surgery

## 2021-09-19 ENCOUNTER — Ambulatory Visit (INDEPENDENT_AMBULATORY_CARE_PROVIDER_SITE_OTHER): Payer: BC Managed Care – PPO | Admitting: Orthopaedic Surgery

## 2021-09-19 ENCOUNTER — Other Ambulatory Visit: Payer: Self-pay

## 2021-09-19 DIAGNOSIS — M25561 Pain in right knee: Secondary | ICD-10-CM

## 2021-09-19 DIAGNOSIS — M1711 Unilateral primary osteoarthritis, right knee: Secondary | ICD-10-CM

## 2021-09-19 DIAGNOSIS — G8929 Other chronic pain: Secondary | ICD-10-CM

## 2021-09-19 MED ORDER — HYLAN G-F 20 48 MG/6ML IX SOSY
48.0000 mg | PREFILLED_SYRINGE | INTRA_ARTICULAR | Status: AC | PRN
  Administered 2021-09-19: 48 mg via INTRA_ARTICULAR

## 2021-09-19 NOTE — Progress Notes (Signed)
Procedure Note  Patient: Linda Cardenas             Date of Birth: [birth year]           MRN: [id number]             Visit Date: 09/19/2021  Procedures: Visit Diagnoses:  1. Chronic pain of right knee   2. Unilateral primary osteoarthritis, right knee     Large Joint Inj: R knee on 09/19/2021 1:35 PM Indications: pain and diagnostic evaluation Details: 22 G 1.5 in needle, superolateral approach  Arthrogram: No  Medications: 48 mg Hylan 48 MG/6ML Outcome: tolerated well, no immediate complications Procedure, treatment alternatives, risks and benefits explained, specific risks discussed. Consent was given by the patient. Immediately prior to procedure a time out was called to verify the correct patient, procedure, equipment, support staff and site/side marked as required. Patient was prepped and draped in the usual sterile fashion.   The patient comes in today with continued significant pain of her right knee with known moderate arthritis.  She has tried and failed other conservative treatment measures including activity modification, rest, quad training exercises and steroid injection in the right knee.  There is a moderate effusion again and I aspirated 30 cc of fluid from the knee.  She is appropriate candidate for hyaluronic acid having had this before on her left knee and significant success.  I did place Synvisc 1 in her right knee today without difficulty.  We will see her back in 4 weeks to see how she is doing overall.

## 2021-10-12 ENCOUNTER — Telehealth: Payer: Self-pay

## 2021-10-12 NOTE — Telephone Encounter (Signed)
VOB submitted for SYnviscOne, right knee. BV pending

## 2021-10-17 ENCOUNTER — Ambulatory Visit (INDEPENDENT_AMBULATORY_CARE_PROVIDER_SITE_OTHER): Payer: BC Managed Care – PPO | Admitting: Orthopaedic Surgery

## 2021-10-17 ENCOUNTER — Encounter: Payer: Self-pay | Admitting: Orthopaedic Surgery

## 2021-10-17 ENCOUNTER — Other Ambulatory Visit: Payer: Self-pay

## 2021-10-17 DIAGNOSIS — M1712 Unilateral primary osteoarthritis, left knee: Secondary | ICD-10-CM

## 2021-10-17 DIAGNOSIS — M1711 Unilateral primary osteoarthritis, right knee: Secondary | ICD-10-CM

## 2021-10-17 MED ORDER — HYLAN G-F 20 48 MG/6ML IX SOSY
48.0000 mg | PREFILLED_SYRINGE | INTRA_ARTICULAR | Status: AC | PRN
  Administered 2021-10-17: 48 mg via INTRA_ARTICULAR

## 2021-10-17 MED ORDER — LIDOCAINE HCL 1 % IJ SOLN
4.0000 mL | INTRAMUSCULAR | Status: AC | PRN
  Administered 2021-10-17: 12:00:00 4 mL

## 2021-10-17 NOTE — Progress Notes (Signed)
Procedure Note  Patient: Linda Cardenas             Date of Birth: 06-03-1956           MRN: [id number]             Visit Date: 10/17/2021 HPI: Linda Cardenas comes in today for scheduled Synvisc 1 injection left knee.  Again she has tried and failed conservative measures including quad strengthening steroid injections and activity modification.  She did undergo right knee Synvisc 1 injection 09/19/2021 and feels like this is helped some.  She has known osteoarthritis of her left knee.  She has no upcoming surgery scheduled next 6 months.  Physical exam: Left knee good range of motion.  Positive effusion no abnormal warmth or erythema.  Procedures: Visit Diagnoses:  1. Unilateral primary osteoarthritis, right knee   2. Unilateral primary osteoarthritis, left knee     Large Joint Inj: R knee on 10/17/2021 11:38 AM Indications: pain Details: 22 G 1.5 in needle, anterolateral approach  Arthrogram: No  Medications: 4 mL lidocaine 1 %; 48 mg Hylan 48 MG/6ML Aspirate: 26 mL yellow and blood-tinged Outcome: tolerated well, no immediate complications Procedure, treatment alternatives, risks and benefits explained, specific risks discussed. Consent was given by the patient. Immediately prior to procedure a time out was called to verify the correct patient, procedure, equipment, support staff and site/side marked as required. Patient was prepped and draped in the usual sterile fashion.    Plan: We will see her back in 8 weeks see how she is doing in regards to both knees.  She tolerated the aspiration injection left knee well today.  Questions were encouraged and answered by Dr. Magnus Ivan and myself.

## 2021-10-28 ENCOUNTER — Telehealth: Payer: Self-pay

## 2021-10-28 NOTE — Telephone Encounter (Signed)
Predetermination is required for SynviscOne, right knee. Faxed completed Predetermination form to [employer] [place] at [phone].

## 2021-10-31 ENCOUNTER — Telehealth: Payer: Self-pay

## 2021-10-31 NOTE — Telephone Encounter (Signed)
Approved for SynviscOne, right knee. Buy & Bill Patient will be responsible for 20% OOP. Co-pay of $20.00? No PA of pre-determination is required  Received gel injection on 10/17/2021

## 2022-09-05 ENCOUNTER — Other Ambulatory Visit: Payer: Self-pay | Admitting: Pulmonary Disease

## 2022-09-07 ENCOUNTER — Encounter: Payer: Self-pay | Admitting: Pulmonary Disease

## 2022-09-07 ENCOUNTER — Other Ambulatory Visit: Payer: Self-pay | Admitting: Pulmonary Disease

## 2022-09-07 ENCOUNTER — Ambulatory Visit (INDEPENDENT_AMBULATORY_CARE_PROVIDER_SITE_OTHER): Payer: BC Managed Care – PPO | Admitting: Pulmonary Disease

## 2022-09-07 VITALS — BP 122/70 | HR 60 | Temp 97.7°F | Ht <= 58 in | Wt 167.2 lb

## 2022-09-07 DIAGNOSIS — R0602 Shortness of breath: Secondary | ICD-10-CM | POA: Diagnosis not present

## 2022-09-07 MED ORDER — AIRDUO DIGIHALER 113-14 MCG/ACT IN AEPB: 1.0000 | INHALATION_SPRAY | Freq: Two times a day (BID) | RESPIRATORY_TRACT | 11 refills | Status: DC

## 2022-09-07 NOTE — Patient Instructions (Signed)
Prescription for air duo sent to pharmacy  We will see you about a year from now  Call us with any significant concerns

## 2022-09-07 NOTE — Progress Notes (Signed)
Subjective:    Patient ID: Linda Cardenas, female    DOB: 23-Apr-1956, 66 y.o.   MRN: [id number]  Shortness of breath, wheezing  Uses inhalers intermittently  Was prescribed Breo at the last visit, Breo was too expensive  She does not feel limited with activities of daily living but does wheeze with activity sometimes  Has no underlying lung disease No history of smoking No significant exposure to secondhand smoke  Usually able to tolerate mild to moderate activity Wheezes on a daily basis  Denies any other associated symptoms  Chronic back pain     Past Medical History:  Diagnosis Date   Arthritis    Back pain     CHRONIC LOW BACK PAIN - GETS INJECTIONS IN BACK   Constipation    Eczema    Elevated liver enzymes    Gallstones    GERD (gastroesophageal reflux disease)    Hyperlipidemia    Hypothyroidism    Lactose intolerance    Shortness of breath    Thyroid disease    Social History   Socioeconomic History   Marital status: Married    Spouse name: Not on file   Number of children: 2   Years of education: Not on file   Highest education level: Not on file  Occupational History   Not on file  Tobacco Use   Smoking status: Never   Smokeless tobacco: Never  Vaping Use   Vaping Use: Never used  Substance and Sexual Activity   Alcohol use: No   Drug use: No   Sexual activity: Not on file  Other Topics Concern   Not on file  Social History Narrative   Not on file   Social Determinants of Health   Financial Resource Strain: Not on file  Food Insecurity: Not on file  Transportation Needs: Not on file  Physical Activity: Not on file  Stress: Not on file  Social Connections: Not on file  Intimate Partner Violence: Not on file   Family History  Problem Relation Age of Onset   Heart disease Father    Heart attack Father    Heart failure Mother    CAD Brother    Review of Systems  Constitutional:  Negative for fever and unexpected weight change.   HENT:  Negative for congestion, dental problem, ear pain, nosebleeds, postnasal drip, rhinorrhea, sinus pressure, sneezing, sore throat and trouble swallowing.   Eyes:  Negative for redness and itching.  Respiratory:  Positive for wheezing. Negative for cough, chest tightness and shortness of breath.   Cardiovascular:  Negative for palpitations.  Genitourinary:  Negative for dysuria.  Skin:  Negative for rash.  Allergic/Immunologic: Negative.       Objective:   Physical Exam Constitutional:      Appearance: Normal appearance.  HENT:     Head: Normocephalic.  Eyes:     Pupils: Pupils are equal, round, and reactive to light.  Cardiovascular:     Rate and Rhythm: Normal rate and regular rhythm.     Pulses: Normal pulses.     Heart sounds: Normal heart sounds. No murmur heard.    No friction rub.  Pulmonary:     Effort: Pulmonary effort is normal. No respiratory distress.     Breath sounds: Normal breath sounds. No stridor. No wheezing or rhonchi.  Musculoskeletal:     Cervical back: No rigidity or tenderness.  Neurological:     Mental Status: She is alert.  Psychiatric:  Mood and Affect: Mood normal.    Vitals:   09/07/22 1551  BP: 122/70  Pulse: 60  Temp: 97.7 F (36.5 C)  SpO2: 97%   Chest x-ray today shows no acute infiltrate, kyphosis  -Reviewed with patient today  Assessment & Plan:  .  Shortness of breath -Not really significant at present  .  Wheezing   Not able to afford the [facility]  She will be traveling to [place] for a few months  Prescription for air duo sent to pharmacy  I encouraged her to give [place] a call if not able to afford the AirDuo and we can look for other alternatives  Follow-up in about a year

## 2022-11-22 ENCOUNTER — Other Ambulatory Visit: Payer: Self-pay | Admitting: Pulmonary Disease

## 2022-11-23 ENCOUNTER — Other Ambulatory Visit (HOSPITAL_COMMUNITY): Payer: Self-pay

## 2022-11-23 NOTE — Telephone Encounter (Signed)
[employer] and [employer] are preferred per benefits investigation.

## 2022-11-24 ENCOUNTER — Other Ambulatory Visit: Payer: Self-pay | Admitting: Pulmonary Disease

## 2022-11-24 MED ORDER — FLUTICASONE FUROATE-VILANTEROL 100-25 MCG/ACT IN AEPB: 1.0000 | INHALATION_SPRAY | Freq: Every day | RESPIRATORY_TRACT | 5 refills | Status: DC

## 2023-05-18 LAB — COLOGUARD: COLOGUARD: NEGATIVE

## 2023-06-13 ENCOUNTER — Encounter: Payer: Self-pay | Admitting: Orthopaedic Surgery

## 2023-06-13 ENCOUNTER — Ambulatory Visit (INDEPENDENT_AMBULATORY_CARE_PROVIDER_SITE_OTHER): Payer: BC Managed Care – PPO | Admitting: Orthopaedic Surgery

## 2023-06-13 ENCOUNTER — Other Ambulatory Visit (INDEPENDENT_AMBULATORY_CARE_PROVIDER_SITE_OTHER): Payer: BC Managed Care – PPO

## 2023-06-13 DIAGNOSIS — M25512 Pain in left shoulder: Secondary | ICD-10-CM | POA: Diagnosis not present

## 2023-06-13 NOTE — Progress Notes (Signed)
The patient is a 67 year old patient of ours who comes in with acute left shoulder pain after injuring it about 2 weeks ago when she was picking up some water from [employer].  She has been having painful range of motion and decreased strength of that left shoulder as well as some pain in her neck with turning to that side.  Her primary care physician gave her 600 mg of ibuprofen.  She is also had acupuncture and has been really very helpful for her.  She is actually getting better she states.  She made this appointment making sure there was not a significant rotator cuff issue.  On exam she is slightly using her deltoid with abduction of her left shoulder but there is no pain with this.  Her external rotation is only slightly limited.  She can reach behind her easily.  Her forward flexion is normal.  There is slight weakness in the rotator cuff.  Again she is not having any pain at all.  3 views of the left shoulder entirely normal.  The glenohumeral joint is well-maintained.  The humeral head is not high riding.  The subacromial outlet is well-maintained.  Since she is doing much better overall I have recommended a home exercise program for her shoulder and have shown her exercises for her to try twice daily.  Since things are feeling better for her follow-up can be as needed.  However if her shoulder seems to be getting worse in terms of weakness she will need to come back and see [place].  All questions and concerns were answered and addressed.  She agrees with this treatment plan.

## 2023-09-03 ENCOUNTER — Ambulatory Visit: Payer: BC Managed Care – PPO | Admitting: Orthopaedic Surgery

## 2023-09-03 DIAGNOSIS — G8929 Other chronic pain: Secondary | ICD-10-CM | POA: Diagnosis not present

## 2023-09-03 DIAGNOSIS — M25561 Pain in right knee: Secondary | ICD-10-CM | POA: Diagnosis not present

## 2023-09-03 DIAGNOSIS — M25562 Pain in left knee: Secondary | ICD-10-CM

## 2023-09-03 MED ORDER — METHYLPREDNISOLONE ACETATE 40 MG/ML IJ SUSP
40.0000 mg | INTRAMUSCULAR | Status: AC | PRN
  Administered 2023-09-03: 40 mg via INTRA_ARTICULAR

## 2023-09-03 MED ORDER — LIDOCAINE HCL 1 % IJ SOLN
3.0000 mL | INTRAMUSCULAR | Status: AC | PRN
  Administered 2023-09-03: 3 mL

## 2023-09-03 NOTE — Progress Notes (Signed)
The patient comes in today for evaluation treatment of bilateral knee pain that is chronic.  We have seen her before for these knees and provided steroid injections in her knees in the past which did not really help as much.  She has had hyaluronic acid in the past.  She feels like she may end up needing those types of injections.  She has had no acute change in her medical status otherwise.  She is not a diabetic.  Examination of both knees shows slight varus malalignment.  There is global tenderness but good range of motion of both knees with no effusion.  I did place a steroid injection in both knees today.  If these do not work she is to call us back because we would order viscosupplementation for her knees.  We would like to get these in before she travels out of the country on December 7.  This patient is diagnosed with osteoarthritis of the knee(s).    Radiographs show evidence of joint space narrowing, osteophytes, subchondral sclerosis and/or subchondral cysts.  This patient has knee pain which interferes with functional and activities of daily living.    This patient has experienced inadequate response, adverse effects and/or intolerance with conservative treatments such as acetaminophen, NSAIDS, topical creams, physical therapy or regular exercise, knee bracing and/or weight loss.   This patient has experienced inadequate response or has a contraindication to intra articular steroid injections for at least 3 months.   This patient is not scheduled to have a total knee replacement within 6 months of starting treatment with viscosupplementation.     Procedure Note  Patient: Linda Cardenas             Date of Birth: [birth year]           MRN: [id number]             Visit Date: 09/03/2023  Procedures: Visit Diagnoses:  1. Chronic pain of right knee   2. Chronic pain of left knee     Large Joint Inj: R knee on 09/03/2023 3:01 PM Indications: diagnostic evaluation and pain Details:  22 G 1.5 in needle, superolateral approach  Arthrogram: No  Medications: 3 mL lidocaine 1 %; 40 mg methylPREDNISolone acetate 40 MG/ML Outcome: tolerated well, no immediate complications Procedure, treatment alternatives, risks and benefits explained, specific risks discussed. Consent was given by the patient. Immediately prior to procedure a time out was called to verify the correct patient, procedure, equipment, support staff and site/side marked as required. Patient was prepped and draped in the usual sterile fashion.    Large Joint Inj: L knee on 09/03/2023 3:01 PM Indications: diagnostic evaluation and pain Details: 22 G 1.5 in needle, superolateral approach  Arthrogram: No  Medications: 3 mL lidocaine 1 %; 40 mg methylPREDNISolone acetate 40 MG/ML Outcome: tolerated well, no immediate complications Procedure, treatment alternatives, risks and benefits explained, specific risks discussed. Consent was given by the patient. Immediately prior to procedure a time out was called to verify the correct patient, procedure, equipment, support staff and site/side marked as required. Patient was prepped and draped in the usual sterile fashion.

## 2023-09-25 ENCOUNTER — Telehealth: Payer: Self-pay | Admitting: Orthopaedic Surgery

## 2023-09-25 NOTE — Telephone Encounter (Signed)
Pt called requesting to submit to gel injection bil knee. Please send in ASAP. Pt will fly into the [place] second week of December. Please call pt when approved at [phone].

## 2023-09-25 NOTE — Telephone Encounter (Signed)
VOB submitted for Euflexxa, bilateral knee. Appts.scheduled.

## 2023-10-08 ENCOUNTER — Ambulatory Visit: Payer: BC Managed Care – PPO | Admitting: Physician Assistant

## 2023-10-08 ENCOUNTER — Other Ambulatory Visit: Payer: Self-pay

## 2023-10-08 DIAGNOSIS — M170 Bilateral primary osteoarthritis of knee: Secondary | ICD-10-CM

## 2023-10-08 DIAGNOSIS — M1712 Unilateral primary osteoarthritis, left knee: Secondary | ICD-10-CM

## 2023-10-08 DIAGNOSIS — M1711 Unilateral primary osteoarthritis, right knee: Secondary | ICD-10-CM

## 2023-10-08 MED ORDER — LIDOCAINE HCL 2 % IJ SOLN
4.0000 mg | INTRAMUSCULAR | Status: AC | PRN
  Administered 2023-10-08: 4 mg

## 2023-10-08 MED ORDER — SODIUM HYALURONATE (VISCOSUP) 20 MG/2ML IX SOSY
20.0000 mg | PREFILLED_SYRINGE | INTRA_ARTICULAR | Status: AC | PRN
  Administered 2023-10-08: 20 mg via INTRA_ARTICULAR

## 2023-10-08 NOTE — Progress Notes (Signed)
   Procedure Note  Patient: Linda Cardenas             Date of Birth: Dec 07, 1955           MRN: [id number]             Visit Date: 10/08/2023 HPI: Patient comes in today for scheduled viscosupplementation injections both knees.  She is had viscosupplementation injections in the past and these have really helped.  She has tried cortisone injections which really did not give her much relief.  She is nondiabetic.  Tried over-the-counter NSAIDs topical creams physical therapy and regular exercise despite this continues to have pain that interferes with her daily activities.  She has no scheduled surgery on either knee in the next 6 months.  Physical exam: Bilateral knees good range of motion.  Left knee slight effusion.  Otherwise no abnormal warmth erythema of either knee. Procedures: Visit Diagnoses:  1. Unilateral primary osteoarthritis, right knee   2. Unilateral primary osteoarthritis, left knee     Large Joint Inj: bilateral knee on 10/08/2023 12:07 PM Indications: pain Details: 22 G 1.5 in needle, superolateral approach  Arthrogram: No  Medications (Right): 20 mg Sodium Hyaluronate (Viscosup) 20 MG/2ML Medications (Left): 4 mg lidocaine 2 %; 20 mg Sodium Hyaluronate (Viscosup) 20 MG/2ML Aspirate (Left): 10 mL yellow Outcome: tolerated well, no immediate complications Procedure, treatment alternatives, risks and benefits explained, specific risks discussed. Consent was given by the patient. Immediately prior to procedure a time out was called to verify the correct patient, procedure, equipment, support staff and site/side marked as required. Patient was prepped and draped in the usual sterile fashion.     Plan: She knows to wait at least 6 months between injections with viscosupplementation.  She will follow-up with [place] in 1 week for her second of 3 Euflexxa injections bilateral knees.  Questions were encouraged and answered

## 2023-10-15 ENCOUNTER — Ambulatory Visit (INDEPENDENT_AMBULATORY_CARE_PROVIDER_SITE_OTHER): Payer: BC Managed Care – PPO | Admitting: Physician Assistant

## 2023-10-15 DIAGNOSIS — M1712 Unilateral primary osteoarthritis, left knee: Secondary | ICD-10-CM | POA: Diagnosis not present

## 2023-10-15 DIAGNOSIS — M1711 Unilateral primary osteoarthritis, right knee: Secondary | ICD-10-CM

## 2023-10-15 DIAGNOSIS — M170 Bilateral primary osteoarthritis of knee: Secondary | ICD-10-CM

## 2023-10-15 MED ORDER — SODIUM HYALURONATE (VISCOSUP) 20 MG/2ML IX SOSY
20.0000 mg | PREFILLED_SYRINGE | INTRA_ARTICULAR | Status: AC | PRN
  Administered 2023-10-15: 20 mg via INTRA_ARTICULAR

## 2023-10-15 MED ORDER — LIDOCAINE HCL 1 % IJ SOLN
5.0000 mL | INTRAMUSCULAR | Status: AC | PRN
  Administered 2023-10-15: 5 mL

## 2023-10-15 NOTE — Progress Notes (Signed)
   Procedure Note  Patient: Linda Cardenas             Date of Birth: 07-10-1956           MRN: [id number]             Visit Date: 10/15/2023 HPI: Patient returns today for bilateral Euflexxa injections.  She states that she has had no adverse effects to either Euflexxa injection.  She does feel that she is not having the pain in both knees whenever she first starts her days.  The pain does return by the end of the day.  Physical exam: General Well-developed well-nourished female no acute distress. Bilateral knees: Good range of motion of both knees no abnormal warmth erythema or effusion  Procedures: Visit Diagnoses:  1. Unilateral primary osteoarthritis, right knee   2. Unilateral primary osteoarthritis, left knee     Large Joint Inj: bilateral knee on 10/15/2023 11:39 AM Indications: pain Details: 22 G 1.5 in needle, anterolateral approach  Arthrogram: No  Medications (Right): 20 mg Sodium Hyaluronate (Viscosup) 20 MG/2ML; 5 mL lidocaine 1 % Medications (Left): 20 mg Sodium Hyaluronate (Viscosup) 20 MG/2ML; 5 mL lidocaine 1 % Outcome: tolerated well, no immediate complications Procedure, treatment alternatives, risks and benefits explained, specific risks discussed. Consent was given by the patient. Immediately prior to procedure a time out was called to verify the correct patient, procedure, equipment, support staff and site/side marked as required. Patient was prepped and draped in the usual sterile fashion.      Plan: Will see her back in 1 week for bilateral Euflexxa injections #3.  Patient tolerated injections well today.

## 2023-10-22 ENCOUNTER — Ambulatory Visit (INDEPENDENT_AMBULATORY_CARE_PROVIDER_SITE_OTHER): Payer: BC Managed Care – PPO | Admitting: Physician Assistant

## 2023-10-22 ENCOUNTER — Other Ambulatory Visit (INDEPENDENT_AMBULATORY_CARE_PROVIDER_SITE_OTHER): Payer: BC Managed Care – PPO

## 2023-10-22 ENCOUNTER — Encounter: Payer: Self-pay | Admitting: Physician Assistant

## 2023-10-22 DIAGNOSIS — M25511 Pain in right shoulder: Secondary | ICD-10-CM

## 2023-10-22 DIAGNOSIS — M1712 Unilateral primary osteoarthritis, left knee: Secondary | ICD-10-CM

## 2023-10-22 DIAGNOSIS — M1711 Unilateral primary osteoarthritis, right knee: Secondary | ICD-10-CM

## 2023-10-22 DIAGNOSIS — M170 Bilateral primary osteoarthritis of knee: Secondary | ICD-10-CM

## 2023-10-22 MED ORDER — SODIUM HYALURONATE (VISCOSUP) 20 MG/2ML IX SOSY
20.0000 mg | PREFILLED_SYRINGE | INTRA_ARTICULAR | Status: AC | PRN
  Administered 2023-10-22: 20 mg via INTRA_ARTICULAR

## 2023-10-22 NOTE — Progress Notes (Signed)
         Procedure Note  Patient: Linda Cardenas             Date of Birth: May 13, 1956           MRN: [id number]             Visit Date: 10/22/2023    HPI: Patient comes in today for third bilateral Euflexxa injection.  She states both knees are little better.  She has had no adverse effects to the Euflexxa injections. She does state that she has had some right shoulder pain for the last 10 days no known injury.  No numbness tingling.  Feels that it is muscular in nature.  Does note the pain is worse with range of motion of the right shoulder.  No known injury.  Has been taking ibuprofen.  Review of systems: Negative for fevers chills.  Physical exam: General Well-developed well-nourished female in no acute distress mood and affect appropriate Bilateral shoulders: Right shoulder positive impingement left negative for impingement.  Abduction is negative bilaterally.  Liftoff test on the right shows slight weakness compared to the left which is 5 out of 5 strength.  Empty can test and external and internal rotation against resistance reveals 5 out of 5 strength bilaterally.  Ull active range of motion with overhead both shoulders.  Procedures: Visit Diagnoses:  1. Unilateral primary osteoarthritis, right knee   2. Unilateral primary osteoarthritis, left knee   3. Acute pain of right shoulder     Large Joint Inj: bilateral knee on 10/22/2023 10:28 AM Indications: pain Details: 22 G 1.5 in needle, anterolateral approach  Arthrogram: No  Medications (Right): 20 mg Sodium Hyaluronate (Viscosup) 20 MG/2ML Medications (Left): 20 mg Sodium Hyaluronate (Viscosup) 20 MG/2ML Outcome: tolerated well, no immediate complications Procedure, treatment alternatives, risks and benefits explained, specific risks discussed. Consent was given by the patient. Immediately prior to procedure a time out was called to verify the correct patient, procedure, equipment, support staff and site/side marked as  required. Patient was prepped and draped in the usual sterile fashion.     Radiographs: Right shoulder 3 views: Shoulder is well located.  Glenohumeral joint appears to be well maintained.  No acute fractures acute findings.  Plan: She knows to wait a 6 months between viscosupplementation injections both knees.  Regards to the right shoulder offered cortisone injection she defers.  Therefore exercise sheets were given.  She will follow-up with [place] as needed.  Questions encouraged and answered at length.

## 2024-03-13 ENCOUNTER — Inpatient Hospital Stay (HOSPITAL_COMMUNITY)
Admission: EM | Admit: 2024-03-13 | Discharge: 2024-03-22 | DRG: 233 | Disposition: A | Discharge status: Home Health | Attending: Thoracic Surgery (Cardiothoracic Vascular Surgery) | Admitting: Thoracic Surgery (Cardiothoracic Vascular Surgery)

## 2024-03-13 ENCOUNTER — Other Ambulatory Visit: Payer: Self-pay

## 2024-03-13 ENCOUNTER — Ambulatory Visit: Admission: EM | Admit: 2024-03-13 | Discharge: 2024-03-13 | Attending: Family Medicine | Admitting: Family Medicine

## 2024-03-13 ENCOUNTER — Emergency Department (HOSPITAL_COMMUNITY)

## 2024-03-13 ENCOUNTER — Encounter: Payer: Self-pay | Admitting: *Deleted

## 2024-03-13 ENCOUNTER — Encounter (HOSPITAL_COMMUNITY): Payer: Self-pay | Admitting: Emergency Medicine

## 2024-03-13 DIAGNOSIS — I10 Essential (primary) hypertension: Secondary | ICD-10-CM | POA: Insufficient documentation

## 2024-03-13 DIAGNOSIS — E039 Hypothyroidism, unspecified: Secondary | ICD-10-CM | POA: Insufficient documentation

## 2024-03-13 DIAGNOSIS — R072 Precordial pain: Secondary | ICD-10-CM | POA: Diagnosis not present

## 2024-03-13 DIAGNOSIS — I214 Non-ST elevation (NSTEMI) myocardial infarction: Principal | ICD-10-CM

## 2024-03-13 DIAGNOSIS — E66811 Obesity, class 1: Secondary | ICD-10-CM | POA: Diagnosis present

## 2024-03-13 DIAGNOSIS — I200 Unstable angina: Secondary | ICD-10-CM

## 2024-03-13 DIAGNOSIS — Z6835 Body mass index (BMI) 35.0-35.9, adult: Secondary | ICD-10-CM

## 2024-03-13 DIAGNOSIS — J45909 Unspecified asthma, uncomplicated: Secondary | ICD-10-CM | POA: Insufficient documentation

## 2024-03-13 DIAGNOSIS — D62 Acute posthemorrhagic anemia: Secondary | ICD-10-CM | POA: Diagnosis not present

## 2024-03-13 DIAGNOSIS — R739 Hyperglycemia, unspecified: Secondary | ICD-10-CM

## 2024-03-13 DIAGNOSIS — E739 Lactose intolerance, unspecified: Secondary | ICD-10-CM | POA: Diagnosis present

## 2024-03-13 DIAGNOSIS — R7989 Other specified abnormal findings of blood chemistry: Secondary | ICD-10-CM

## 2024-03-13 DIAGNOSIS — Z79890 Hormone replacement therapy (postmenopausal): Secondary | ICD-10-CM

## 2024-03-13 DIAGNOSIS — E785 Hyperlipidemia, unspecified: Secondary | ICD-10-CM | POA: Diagnosis present

## 2024-03-13 DIAGNOSIS — G8929 Other chronic pain: Secondary | ICD-10-CM | POA: Diagnosis present

## 2024-03-13 DIAGNOSIS — R0789 Other chest pain: Secondary | ICD-10-CM

## 2024-03-13 DIAGNOSIS — I340 Nonrheumatic mitral (valve) insufficiency: Secondary | ICD-10-CM | POA: Diagnosis present

## 2024-03-13 DIAGNOSIS — R001 Bradycardia, unspecified: Principal | ICD-10-CM

## 2024-03-13 DIAGNOSIS — I2510 Atherosclerotic heart disease of native coronary artery without angina pectoris: Secondary | ICD-10-CM | POA: Diagnosis present

## 2024-03-13 DIAGNOSIS — R7303 Prediabetes: Secondary | ICD-10-CM | POA: Diagnosis present

## 2024-03-13 DIAGNOSIS — D65 Disseminated intravascular coagulation [defibrination syndrome]: Secondary | ICD-10-CM | POA: Diagnosis not present

## 2024-03-13 DIAGNOSIS — Z8249 Family history of ischemic heart disease and other diseases of the circulatory system: Secondary | ICD-10-CM

## 2024-03-13 DIAGNOSIS — Z7982 Long term (current) use of aspirin: Secondary | ICD-10-CM

## 2024-03-13 DIAGNOSIS — G9340 Encephalopathy, unspecified: Secondary | ICD-10-CM | POA: Diagnosis present

## 2024-03-13 DIAGNOSIS — M1990 Unspecified osteoarthritis, unspecified site: Secondary | ICD-10-CM | POA: Diagnosis present

## 2024-03-13 DIAGNOSIS — K219 Gastro-esophageal reflux disease without esophagitis: Secondary | ICD-10-CM | POA: Diagnosis present

## 2024-03-13 DIAGNOSIS — I110 Hypertensive heart disease with heart failure: Secondary | ICD-10-CM | POA: Diagnosis present

## 2024-03-13 DIAGNOSIS — I5032 Chronic diastolic (congestive) heart failure: Secondary | ICD-10-CM | POA: Insufficient documentation

## 2024-03-13 DIAGNOSIS — Z79899 Other long term (current) drug therapy: Secondary | ICD-10-CM

## 2024-03-13 DIAGNOSIS — Z951 Presence of aortocoronary bypass graft: Secondary | ICD-10-CM

## 2024-03-13 LAB — CBC
HCT: 37.5 % (ref 36.0–46.0)
Hemoglobin: 12.3 g/dL (ref 12.0–15.0)
MCH: 28.5 pg (ref 26.0–34.0)
MCHC: 32.8 g/dL (ref 30.0–36.0)
MCV: 86.8 fL (ref 80.0–100.0)
Platelets: 201 10*3/uL (ref 150–400)
RBC: 4.32 MIL/uL (ref 3.87–5.11)
RDW: 13.9 % (ref 11.5–15.5)
WBC: 6.4 10*3/uL (ref 4.0–10.5)
nRBC: 0.3 % — ABNORMAL HIGH (ref 0.0–0.2)

## 2024-03-13 LAB — BASIC METABOLIC PANEL WITH GFR
Anion gap: 9 (ref 5–15)
BUN: 10 mg/dL (ref 8–23)
CO2: 24 mmol/L (ref 22–32)
Calcium: 9.5 mg/dL (ref 8.9–10.3)
Chloride: 106 mmol/L (ref 98–111)
Creatinine, Ser: 0.49 mg/dL (ref 0.44–1.00)
GFR, Estimated: >60 mL/min (ref >60)
Glucose, Bld: 104 mg/dL — ABNORMAL HIGH (ref 70–99)
Potassium: 3.8 mmol/L (ref 3.5–5.1)
Sodium: 139 mmol/L (ref 135–145)

## 2024-03-13 LAB — D-DIMER, QUANTITATIVE: D-Dimer, Quant: 0.33 ug{FEU}/mL (ref 0.00–0.50)

## 2024-03-13 NOTE — ED Provider Triage Note (Signed)
 Emergency Medicine Provider Triage Evaluation Note  Linda Cardenas , a 68 y.o. female  was evaluated in triage.  Pt complains of chest discomfort. Report lightheadedness, dizzy, L arm pain, some sob and overall not feeling well since yesterday.  Noted to have low HR.  Is on metoprolol  but denies dosage changes.  No cp.  No hx of PE  Review of Systems  Positive: As above Negative: As above  Physical Exam  BP (!) 168/68 (BP Location: Right Arm)   Pulse (!) 52   Temp 98 F (36.7 C)   Resp 18   Ht 4\' 10"  (1.473 m)   Wt 74.4 kg   SpO2 98%   BMI 34.28 kg/m  Gen:   Awake, no distress   Resp:  Normal effort  MSK:   Moves extremities without difficulty  Other:    Medical Decision Making  Medically screening exam initiated at 9:02 PM.  Appropriate orders placed.  Linda Cardenas was informed that the remainder of the evaluation will be completed by another provider, this initial triage assessment does not replace that evaluation, and the importance of remaining in the ED until their evaluation is complete.     Linda Fairy, PA-C 03/13/24 2103

## 2024-03-13 NOTE — Discharge Instructions (Addendum)
 Patient's daughter will transport her by private car to the emergency room for further evaluation and treatment

## 2024-03-13 NOTE — ED Notes (Signed)
 Patient is being discharged from the Urgent Care and sent to the Emergency Department via private vehicle . Per Dr. Ellsworth Haas, patient is in need of higher level of care due to chest pain, left arm pain. Patient/daughter is aware and verbalizes understanding of plan of care.  Vitals:   03/13/24 1946  BP: (!) 154/79  Pulse: (!) 59  Resp: 20  Temp: 98.5 F (36.9 C)  SpO2: 95%

## 2024-03-13 NOTE — ED Triage Notes (Signed)
 Patient/daughter states patient started heartburn since yesterday that did not go away until later in the day today but she also had not taken anything for it, also states she had left arm pain today that was intermittent.

## 2024-03-13 NOTE — ED Triage Notes (Signed)
 Pt brought in by daughter after recommendations from the urgent care.  Pt has been experiencing substernal chest discomfort feeling like indigestion accompanied by nausea and generally not feeling well starting yesterday. Today she began feeling dizzy and had left arm tingling.  Pt reports her father died of a heart attack at the age of 40 and her mother had cardiac issues as well.  Daughter did give an baby asa this evening.

## 2024-03-13 NOTE — ED Provider Notes (Signed)
 [facility]    CSN: [id number] Arrival date & time: 03/13/24  1930      History   Chief Complaint Chief Complaint  Patient presents with   Heartburn   Arm Pain    HPI Linda Cardenas is a 68 y.o. female.    Heartburn  Arm Pain  Here for heartburn, left arm pain, and some shortness of breath.  Yesterday she began having burning in her lower chest.  It did not really get better all day and it got worse after she ate last night.  She has had nausea much of the time she was having this discomfort.  She is still nauseated tonight.  She also had some left arm pain earlier today, though that is actually improved.  No fever or cough or congestion.  She has been feeling short of breath today.  Her daughter is a Engineer, civil (consulting) and states that on her stethoscope she saw possible ST segment elevation.  The patient has not had any diaphoresis or palpitations.  She does not usually have bradycardia.  Past Medical History:  Diagnosis Date   Arthritis    Back pain     CHRONIC LOW BACK PAIN - GETS INJECTIONS IN BACK   Constipation    Eczema    Elevated liver enzymes    Gallstones    GERD (gastroesophageal reflux disease)    Hyperlipidemia    Hypothyroidism    Lactose intolerance    Shortness of breath    Thyroid  disease     Patient Active Problem List   Diagnosis Date Noted   Unilateral primary osteoarthritis, left knee 02/09/2021   Cholelithiasis with cholecystitis 12/11/2012    Past Surgical History:  Procedure Laterality Date   BREAST SURGERY     "PIMPLE" REMOVED FROM BREAST IN OFFICE   CESAREAN SECTION     CHOLECYSTECTOMY  12/26/2012   Procedure: LAPAROSCOPIC CHOLECYSTECTOMY WITH INTRAOPERATIVE CHOLANGIOGRAM;  Surgeon: Keitha Pata, MD;  Location: [facility];  Service: General;  Laterality: N/A;   LUMBAR FUSION N/A L4/5 04/2013    OB History   No obstetric history on file.      Home Medications    Prior to Admission medications   Medication Sig Start Date End  Date Taking? Authorizing Provider  acetaminophen -codeine  (TYLENOL  #3) 300-30 MG tablet Take 1-2 tablets by mouth every 8 (eight) hours as needed. 12/31/20   Arnie Lao, MD  aspirin  81 MG tablet Take 81 mg by mouth daily.    [provider]  calcium  carbonate (OS-CAL) 600 MG TABS Take 600 mg by mouth daily.    [provider]  cephALEXin (KEFLEX) 500 MG capsule Take 500 mg by mouth 4 (four) times daily. 04/28/22   [provider]  Cholecalciferol (VITAMIN D) 2000 UNITS tablet Take 2,000 Units by mouth daily.    [provider]  diclofenac  (VOLTAREN ) 75 MG EC tablet Take 1 tablet (75 mg total) by mouth 2 (two) times daily between meals as needed. 08/04/21   Arnie Lao, MD  docusate sodium (COLACE) 50 MG capsule Take by mouth 2 (two) times daily.    [provider]  fluticasone  (FLONASE) 50 MCG/ACT nasal spray Place into both nostrils. 08/29/22   [provider]  fluticasone  furoate-vilanterol (BREO ELLIPTA ) 100-25 MCG/ACT AEPB Inhale 1 puff into the lungs daily. 11/24/22   Olalere, Rickie Chars, MD  levothyroxine  (SYNTHROID ) 88 MCG tablet Take 88 mcg by mouth every morning. 08/23/22   [provider]  levothyroxine  (  SYNTHROID , LEVOTHROID) 75 MCG tablet Take 75 mcg by mouth daily before breakfast.     [provider]  magnesium gluconate (MAGONATE) 500 MG tablet Take 500 mg by mouth daily as needed. constipation    [provider]  metoprolol  succinate (TOPROL -XL) 50 MG 24 hr tablet Take 1 tablet (50 mg total) by mouth daily. Take with or immediately following a meal. 12/16/19 12/10/20  Knox Perl, MD  metoprolol  tartrate (LOPRESSOR ) 25 MG tablet Take 25 mg by mouth daily. 08/26/22   [provider]  Multiple Vitamins-Minerals (MULTIVITAMIN WITH MINERALS) tablet Take 1 tablet by mouth daily.    [provider]  nitrofurantoin, macrocrystal-monohydrate, (MACROBID) 100 MG capsule Take 100 mg by  mouth 2 (two) times daily. 12/31/19   [provider]  nitroGLYCERIN  (NITROSTAT ) 0.4 MG SL tablet Place 1 tablet (0.4 mg total) under the tongue every 5 (five) minutes as needed for up to 25 days for chest pain. 01/28/19 02/22/19  Knox Perl, MD  NON FORMULARY Spinal shot once per year-last time 01/15/13.Dr Docia Freeman in [place] [phone]    [provider]  phenazopyridine (PYRIDIUM) 200 MG tablet Take 200 mg by mouth 3 (three) times daily as needed. 12/31/19   [provider]  rosuvastatin  (CRESTOR ) 20 MG tablet TAKE 1 TABLET BY MOUTH EVERY DAY 03/31/19   Druscilla Gerhard, NP  traMADol  (ULTRAM ) 50 MG tablet Take 1 tablet (50 mg total) by mouth every 6 (six) hours as needed. 08/04/21   Arnie Lao, MD    Family History Family History  Problem Relation Age of Onset   Heart disease Father    Heart attack Father    Heart failure Mother    CAD Brother     Social History Social History   Tobacco Use   Smoking status: Never   Smokeless tobacco: Never  Vaping Use   Vaping status: Never Used  Substance Use Topics   Alcohol use: No   Drug use: No     Allergies   Beef-derived drug products and Pork-derived products   Review of Systems Review of Systems  Gastrointestinal:  Positive for heartburn.     Physical Exam Triage Vital Signs ED Triage Vitals  Encounter Vitals Group     BP 03/13/24 1946 (!) 154/79     Systolic BP Percentile --      Diastolic BP Percentile --      Pulse Rate 03/13/24 1946 (!) 59     Resp 03/13/24 1946 20     Temp 03/13/24 1946 98.5 F (36.9 C)     Temp Source 03/13/24 1946 Oral     SpO2 03/13/24 1946 95 %     Weight 03/13/24 1943 164 lb (74.4 kg)     Height 03/13/24 1943 4\' 10"  (1.473 m)     Head Circumference --      Peak Flow --      Pain Score --      Pain Loc --      Pain Education --      Exclude from Growth Chart --    No data found.  Updated Vital Signs BP (!) 154/79 (BP Location: Right Arm)    Pulse (!) 59   Temp 98.5 F (36.9 C) (Oral)   Resp 20   Ht 4\' 10"  (1.473 m)   Wt 74.4 kg   SpO2 95%   BMI 34.28 kg/m   Visual Acuity Right Eye Distance:   Left Eye Distance:   Bilateral Distance:  Right Eye Near:   Left Eye Near:    Bilateral Near:     Physical Exam Vitals reviewed.  Constitutional:      General: She is not in acute distress.    Appearance: She is not toxic-appearing.  HENT:     Mouth/Throat:     Mouth: Mucous membranes are moist.  Eyes:     Extraocular Movements: Extraocular movements intact.     Conjunctiva/sclera: Conjunctivae normal.     Pupils: Pupils are equal, round, and reactive to light.  Cardiovascular:     Rate and Rhythm: Regular rhythm. Bradycardia present.     Heart sounds: No murmur heard. Pulmonary:     Effort: Pulmonary effort is normal.     Breath sounds: Normal breath sounds.  Musculoskeletal:     Cervical back: Neck supple.  Lymphadenopathy:     Cervical: No cervical adenopathy.  Skin:    Coloration: Skin is not pale.  Neurological:     General: No focal deficit present.     Mental Status: She is alert and oriented to person, place, and time.  Psychiatric:        Behavior: Behavior normal.      UC Treatments / Results  Labs (all labs ordered are listed, but only abnormal results are displayed) Labs Reviewed - No data to display  EKG   Radiology No results found.  Procedures Procedures (including critical care time)  Medications Ordered in UC Medications - No data to display  Initial Impression / Assessment and Plan / UC Course  I have reviewed the triage vital signs and the nursing notes.  Pertinent labs & imaging results that were available during my care of the patient were reviewed by me and considered in my medical decision making (see chart for details).      History is obtained from the patient and her daughter.  EKG does not show any significant ST segment elevation.  There is almost a 1 mm  elevation in V2 but there is no reciprocal or adjacent ST segment elevations.  Heart rate is 52 on that.  I have discussed with the patient and her daughter, that though this could be GI in origin, she has enough worrisome symptoms that she needs urgent evaluation in the emergency room.  They are agreeable and will go by private car to the emergency room.   Final Clinical Impressions(s) / UC Diagnoses   Final diagnoses:  Precordial pain     Discharge Instructions      Patient's daughter will transport her by private car to the emergency room for further evaluation and treatment    ED Prescriptions   None    PDMP not reviewed this encounter.   Ann Keto, MD 03/13/24 2014

## 2024-03-14 ENCOUNTER — Encounter (HOSPITAL_COMMUNITY): Payer: Self-pay | Admitting: Internal Medicine

## 2024-03-14 ENCOUNTER — Encounter (HOSPITAL_COMMUNITY)
Admission: EM | Disposition: A | Discharge status: Home Health | Payer: Self-pay | Source: Home / Self Care | Attending: Thoracic Surgery (Cardiothoracic Vascular Surgery)

## 2024-03-14 ENCOUNTER — Inpatient Hospital Stay (HOSPITAL_COMMUNITY)

## 2024-03-14 DIAGNOSIS — E039 Hypothyroidism, unspecified: Secondary | ICD-10-CM

## 2024-03-14 DIAGNOSIS — I214 Non-ST elevation (NSTEMI) myocardial infarction: Principal | ICD-10-CM

## 2024-03-14 DIAGNOSIS — Z79899 Other long term (current) drug therapy: Secondary | ICD-10-CM | POA: Diagnosis not present

## 2024-03-14 DIAGNOSIS — Z01810 Encounter for preprocedural cardiovascular examination: Secondary | ICD-10-CM | POA: Diagnosis not present

## 2024-03-14 DIAGNOSIS — I2510 Atherosclerotic heart disease of native coronary artery without angina pectoris: Secondary | ICD-10-CM | POA: Diagnosis present

## 2024-03-14 DIAGNOSIS — E739 Lactose intolerance, unspecified: Secondary | ICD-10-CM | POA: Diagnosis present

## 2024-03-14 DIAGNOSIS — R001 Bradycardia, unspecified: Principal | ICD-10-CM

## 2024-03-14 DIAGNOSIS — I110 Hypertensive heart disease with heart failure: Secondary | ICD-10-CM | POA: Diagnosis present

## 2024-03-14 DIAGNOSIS — I25110 Atherosclerotic heart disease of native coronary artery with unstable angina pectoris: Secondary | ICD-10-CM | POA: Diagnosis not present

## 2024-03-14 DIAGNOSIS — I200 Unstable angina: Secondary | ICD-10-CM | POA: Diagnosis not present

## 2024-03-14 DIAGNOSIS — J4520 Mild intermittent asthma, uncomplicated: Secondary | ICD-10-CM

## 2024-03-14 DIAGNOSIS — J45909 Unspecified asthma, uncomplicated: Secondary | ICD-10-CM | POA: Diagnosis present

## 2024-03-14 DIAGNOSIS — E038 Other specified hypothyroidism: Secondary | ICD-10-CM

## 2024-03-14 DIAGNOSIS — G8929 Other chronic pain: Secondary | ICD-10-CM | POA: Diagnosis present

## 2024-03-14 DIAGNOSIS — G9340 Encephalopathy, unspecified: Secondary | ICD-10-CM | POA: Diagnosis present

## 2024-03-14 DIAGNOSIS — Z6835 Body mass index (BMI) 35.0-35.9, adult: Secondary | ICD-10-CM | POA: Diagnosis not present

## 2024-03-14 DIAGNOSIS — R7989 Other specified abnormal findings of blood chemistry: Secondary | ICD-10-CM

## 2024-03-14 DIAGNOSIS — E785 Hyperlipidemia, unspecified: Secondary | ICD-10-CM | POA: Diagnosis present

## 2024-03-14 DIAGNOSIS — Z7982 Long term (current) use of aspirin: Secondary | ICD-10-CM | POA: Diagnosis not present

## 2024-03-14 DIAGNOSIS — M1990 Unspecified osteoarthritis, unspecified site: Secondary | ICD-10-CM | POA: Diagnosis present

## 2024-03-14 DIAGNOSIS — D62 Acute posthemorrhagic anemia: Secondary | ICD-10-CM | POA: Diagnosis not present

## 2024-03-14 DIAGNOSIS — R0789 Other chest pain: Secondary | ICD-10-CM

## 2024-03-14 DIAGNOSIS — I5032 Chronic diastolic (congestive) heart failure: Secondary | ICD-10-CM

## 2024-03-14 DIAGNOSIS — I340 Nonrheumatic mitral (valve) insufficiency: Secondary | ICD-10-CM | POA: Diagnosis present

## 2024-03-14 DIAGNOSIS — Z8249 Family history of ischemic heart disease and other diseases of the circulatory system: Secondary | ICD-10-CM | POA: Diagnosis not present

## 2024-03-14 DIAGNOSIS — E66811 Obesity, class 1: Secondary | ICD-10-CM | POA: Diagnosis present

## 2024-03-14 DIAGNOSIS — R7303 Prediabetes: Secondary | ICD-10-CM | POA: Diagnosis present

## 2024-03-14 DIAGNOSIS — R739 Hyperglycemia, unspecified: Secondary | ICD-10-CM | POA: Diagnosis not present

## 2024-03-14 DIAGNOSIS — K219 Gastro-esophageal reflux disease without esophagitis: Secondary | ICD-10-CM | POA: Diagnosis present

## 2024-03-14 DIAGNOSIS — Z79890 Hormone replacement therapy (postmenopausal): Secondary | ICD-10-CM | POA: Diagnosis not present

## 2024-03-14 DIAGNOSIS — I10 Essential (primary) hypertension: Secondary | ICD-10-CM | POA: Diagnosis not present

## 2024-03-14 DIAGNOSIS — D65 Disseminated intravascular coagulation [defibrination syndrome]: Secondary | ICD-10-CM | POA: Diagnosis not present

## 2024-03-14 HISTORY — PX: LEFT HEART CATH AND CORONARY ANGIOGRAPHY: CATH118249

## 2024-03-14 LAB — CBC
HCT: 37.7 % (ref 36.0–46.0)
Hemoglobin: 12.4 g/dL (ref 12.0–15.0)
MCH: 28.9 pg (ref 26.0–34.0)
MCHC: 32.9 g/dL (ref 30.0–36.0)
MCV: 87.9 fL (ref 80.0–100.0)
Platelets: 183 10*3/uL (ref 150–400)
RBC: 4.29 MIL/uL (ref 3.87–5.11)
RDW: 14.1 % (ref 11.5–15.5)
WBC: 6.8 10*3/uL (ref 4.0–10.5)
nRBC: 0 % (ref 0.0–0.2)

## 2024-03-14 LAB — COMPREHENSIVE METABOLIC PANEL WITH GFR
ALT: 24 U/L (ref 0–44)
AST: 28 U/L (ref 15–41)
Albumin: 3.7 g/dL (ref 3.5–5.0)
Alkaline Phosphatase: 42 U/L (ref 38–126)
Anion gap: 11 (ref 5–15)
BUN: 8 mg/dL (ref 8–23)
CO2: 24 mmol/L (ref 22–32)
Calcium: 9.4 mg/dL (ref 8.9–10.3)
Chloride: 105 mmol/L (ref 98–111)
Creatinine, Ser: 0.51 mg/dL (ref 0.44–1.00)
GFR, Estimated: >60 mL/min (ref >60)
Glucose, Bld: 92 mg/dL (ref 70–99)
Potassium: 3.8 mmol/L (ref 3.5–5.1)
Sodium: 140 mmol/L (ref 135–145)
Total Bilirubin: 0.7 mg/dL (ref 0.0–1.2)
Total Protein: 6.8 g/dL (ref 6.5–8.1)

## 2024-03-14 LAB — ECHOCARDIOGRAM COMPLETE
AR max vel: 2.08 cm2
AV Area VTI: 2.29 cm2
AV Area mean vel: 2.34 cm2
AV Mean grad: 3 mmHg
AV Peak grad: 6.5 mmHg
Ao pk vel: 1.27 m/s
Area-P 1/2: 3.66 cm2
Height: 58 in
S' Lateral: 2.8 cm
Weight: 2624.36 [oz_av]

## 2024-03-14 LAB — TROPONIN I (HIGH SENSITIVITY)
Troponin I (High Sensitivity): 29 ng/L — ABNORMAL HIGH (ref <18)
Troponin I (High Sensitivity): 29 ng/L — ABNORMAL HIGH (ref <18)
Troponin I (High Sensitivity): 27 ng/L — ABNORMAL HIGH (ref <18)
Troponin I (High Sensitivity): 29 ng/L — ABNORMAL HIGH (ref <18)
Troponin I (High Sensitivity): 26 ng/L — ABNORMAL HIGH (ref <18)

## 2024-03-14 LAB — T4, FREE: Free T4: 1.09 ng/dL (ref 0.61–1.12)

## 2024-03-14 LAB — LIPID PANEL
Cholesterol: 145 mg/dL (ref 0–200)
HDL: 68 mg/dL (ref >40)
LDL Cholesterol: 67 mg/dL (ref 0–99)
Total CHOL/HDL Ratio: 2.1 ratio
Triglycerides: 48 mg/dL (ref <150)
VLDL: 10 mg/dL (ref 0–40)

## 2024-03-14 LAB — TSH: TSH: 0.953 u[IU]/mL (ref 0.350–4.500)

## 2024-03-14 LAB — HIV ANTIBODY (ROUTINE TESTING W REFLEX): HIV Screen 4th Generation wRfx: NONREACTIVE

## 2024-03-14 LAB — PROTIME-INR
INR: 1.1 (ref 0.8–1.2)
Prothrombin Time: 14.3 s (ref 11.4–15.2)

## 2024-03-14 LAB — APTT: aPTT: 30 s (ref 24–36)

## 2024-03-14 LAB — HEMOGLOBIN A1C
Hgb A1c MFr Bld: 6 % — ABNORMAL HIGH (ref 4.8–5.6)
Mean Plasma Glucose: 125.5 mg/dL

## 2024-03-14 LAB — HEPARIN LEVEL (UNFRACTIONATED): Heparin Unfractionated: 0.54 [IU]/mL (ref 0.30–0.70)

## 2024-03-14 MED ORDER — FENTANYL CITRATE (PF) 100 MCG/2ML IJ SOLN
INTRAMUSCULAR | Status: AC
  Filled 2024-03-14: qty 2

## 2024-03-14 MED ORDER — LEVOTHYROXINE SODIUM 100 MCG PO TABS
100.0000 ug | ORAL_TABLET | Freq: Every day | ORAL | Status: DC
  Administered 2024-03-14 – 2024-03-22 (×9): 100 ug via ORAL
  Filled 2024-03-14 (×9): qty 1

## 2024-03-14 MED ORDER — VERAPAMIL HCL 2.5 MG/ML IV SOLN
INTRAVENOUS | Status: DC | PRN
  Administered 2024-03-14: 10 mL via INTRA_ARTERIAL

## 2024-03-14 MED ORDER — SODIUM CHLORIDE 0.9% FLUSH
3.0000 mL | Freq: Two times a day (BID) | INTRAVENOUS | Status: DC
  Administered 2024-03-14 – 2024-03-16 (×5): 3 mL via INTRAVENOUS

## 2024-03-14 MED ORDER — IOHEXOL 350 MG/ML SOLN
INTRAVENOUS | Status: DC | PRN
  Administered 2024-03-14: 30 mL

## 2024-03-14 MED ORDER — ASPIRIN 81 MG PO CHEW: 81.0000 mg | CHEWABLE_TABLET | ORAL | Status: DC

## 2024-03-14 MED ORDER — PERFLUTREN LIPID MICROSPHERE
1.0000 mL | INTRAVENOUS | Status: AC | PRN
  Administered 2024-03-14: 2 mL via INTRAVENOUS

## 2024-03-14 MED ORDER — ALUM & MAG HYDROXIDE-SIMETH 200-200-20 MG/5ML PO SUSP: 30.0000 mL | Freq: Four times a day (QID) | ORAL | Status: DC | PRN

## 2024-03-14 MED ORDER — ACETAMINOPHEN 650 MG RE SUPP: 650.0000 mg | Freq: Four times a day (QID) | RECTAL | Status: DC | PRN

## 2024-03-14 MED ORDER — ASPIRIN 81 MG PO CHEW
324.0000 mg | CHEWABLE_TABLET | Freq: Once | ORAL | Status: AC
  Administered 2024-03-14: 324 mg via ORAL
  Filled 2024-03-14: qty 4

## 2024-03-14 MED ORDER — MIDAZOLAM HCL 2 MG/2ML IJ SOLN
INTRAMUSCULAR | Status: DC | PRN
  Administered 2024-03-14: 2 mg via INTRAVENOUS

## 2024-03-14 MED ORDER — HEPARIN BOLUS VIA INFUSION
3000.0000 [IU] | Freq: Once | INTRAVENOUS | Status: AC
  Administered 2024-03-14: 3000 [IU] via INTRAVENOUS
  Filled 2024-03-14: qty 3000

## 2024-03-14 MED ORDER — MIDAZOLAM HCL 2 MG/2ML IJ SOLN
INTRAMUSCULAR | Status: AC
  Filled 2024-03-14: qty 2

## 2024-03-14 MED ORDER — ACETAMINOPHEN 325 MG PO TABS: 650.0000 mg | ORAL_TABLET | Freq: Four times a day (QID) | ORAL | Status: DC | PRN

## 2024-03-14 MED ORDER — VERAPAMIL HCL 2.5 MG/ML IV SOLN
INTRAVENOUS | Status: AC
  Filled 2024-03-14: qty 2

## 2024-03-14 MED ORDER — HEPARIN SODIUM (PORCINE) 1000 UNIT/ML IJ SOLN
INTRAMUSCULAR | Status: DC | PRN
  Administered 2024-03-14: 5000 [IU] via INTRAVENOUS

## 2024-03-14 MED ORDER — SODIUM CHLORIDE 0.9 % IV SOLN: INTRAVENOUS | Status: DC

## 2024-03-14 MED ORDER — FENTANYL CITRATE (PF) 100 MCG/2ML IJ SOLN
INTRAMUSCULAR | Status: DC | PRN
  Administered 2024-03-14 (×2): 50 ug via INTRAVENOUS

## 2024-03-14 MED ORDER — HEPARIN (PORCINE) IN NACL 1000-0.9 UT/500ML-% IV SOLN
INTRAVENOUS | Status: DC | PRN
  Administered 2024-03-14: 500 mL via SURGICAL_CAVITY

## 2024-03-14 MED ORDER — LIDOCAINE HCL (PF) 1 % IJ SOLN
INTRAMUSCULAR | Status: DC | PRN
  Administered 2024-03-14: 2 mL

## 2024-03-14 MED ORDER — SODIUM CHLORIDE 0.9 % IV SOLN: 250.0000 mL | INTRAVENOUS | Status: AC | PRN

## 2024-03-14 MED ORDER — HYDRALAZINE HCL 20 MG/ML IJ SOLN: 5.0000 mg | Freq: Four times a day (QID) | INTRAMUSCULAR | Status: DC | PRN

## 2024-03-14 MED ORDER — ROSUVASTATIN CALCIUM 20 MG PO TABS
20.0000 mg | ORAL_TABLET | Freq: Every day | ORAL | Status: DC
  Administered 2024-03-14 – 2024-03-16 (×3): 20 mg via ORAL
  Filled 2024-03-14 (×3): qty 1

## 2024-03-14 MED ORDER — ROSUVASTATIN CALCIUM 20 MG PO TABS: 20.0000 mg | ORAL_TABLET | Freq: Every day | ORAL | Status: DC

## 2024-03-14 MED ORDER — ASPIRIN 81 MG PO TBEC
81.0000 mg | DELAYED_RELEASE_TABLET | Freq: Every day | ORAL | Status: DC
  Administered 2024-03-15 – 2024-03-16 (×2): 81 mg via ORAL
  Filled 2024-03-14 (×2): qty 1

## 2024-03-14 MED ORDER — SODIUM CHLORIDE 0.9% FLUSH: 3.0000 mL | INTRAVENOUS | Status: DC | PRN

## 2024-03-14 MED ORDER — ALBUTEROL SULFATE (2.5 MG/3ML) 0.083% IN NEBU: 2.5000 mg | INHALATION_SOLUTION | Freq: Four times a day (QID) | RESPIRATORY_TRACT | Status: DC | PRN

## 2024-03-14 MED ORDER — FLUTICASONE FUROATE-VILANTEROL 100-25 MCG/ACT IN AEPB
1.0000 | INHALATION_SPRAY | Freq: Every day | RESPIRATORY_TRACT | Status: DC
  Administered 2024-03-16 – 2024-03-22 (×6): 1 via RESPIRATORY_TRACT
  Filled 2024-03-14 (×2): qty 28

## 2024-03-14 MED ORDER — HEPARIN (PORCINE) 25000 UT/250ML-% IV SOLN
800.0000 [IU]/h | INTRAVENOUS | Status: DC
  Administered 2024-03-14: 800 [IU]/h via INTRAVENOUS
  Filled 2024-03-14: qty 250

## 2024-03-14 MED ORDER — LIDOCAINE HCL (PF) 1 % IJ SOLN
INTRAMUSCULAR | Status: AC
  Filled 2024-03-14: qty 30

## 2024-03-14 MED ORDER — HEPARIN SODIUM (PORCINE) 1000 UNIT/ML IJ SOLN
INTRAMUSCULAR | Status: AC
  Filled 2024-03-14: qty 10

## 2024-03-14 NOTE — H&P (Addendum)
 History and Physical    Linda Cardenas ZOX:[id number] DOB: Dec 20, 1955 DOA: 03/13/2024  PCP: Arlys Berke, MD   Patient coming from: Home   Chief Complaint:  Chief Complaint  Patient presents with   chest discomfort   ED TRIAGE note:  Pt brought in by daughter after recommendations from the urgent care.  Pt has been experiencing substernal chest discomfort feeling like indigestion accompanied by nausea and generally not feeling well starting yesterday. Today she began feeling dizzy and had left arm tingling.  Pt reports her father died of a heart attack at the age of 38 and her mother had cardiac issues as well.  Daughter did give an baby asa this evening.            HPI:  Linda Cardenas is a 68 y.o. female with medical history significant of chronic osteoarthritis bilateral knee joint, reactive airway disease, hyperlipidemia, hypothyroidism and goiter, chronic back pain, essential hypertension and grade 1 diastolic heart failure with preserved EF 68% presented to emergency department complaining of chest discomfort, dizziness and left-sided arm tingling.  Patient reported that since Wednesday patient has intermittent chest discomfort which has been persistent throughout stay.  On Thursday patient noticed associated dizziness and left-sided arm tingling.  Patient denies any sharp chest pain, however she is experiencing chest discomfort which is more similar to maldigestion.  Also complaining about feeling dyspnea.  Denies any palpitation, cough, nausea, syncope and presyncope.    ED Course:  At presentation to ED patient found to have elevated blood pressure 168/68.  Heart rate in between 44-52. Elevated troponin 29.  Normal D-dimer. CBC and BMP unremarkable.. EKG showing sinus bradycardia heart rate 48.  Chest x-ray showing kyphotic positioning.  No acute cardiopulmonary abnormality.  In the ED patient has been given aspirin  324 mg.  Dr. Monique Ano spoke with the on-call cardiology  Dr. Jeryl Moris  who recommended trend troponin, low-dose IV heparin  drip and plan for heart cath today.  Hospitalist has been consulted for further evaluation management of chest discomfort, bradycardia and elevated troponin.  Significant labs in the ED: Lab Orders         Basic metabolic panel         CBC         D-dimer, quantitative         HIV Antibody (routine testing w rflx)         Comprehensive metabolic panel         CBC         Protime-INR         APTT         Lipid panel         Hemoglobin A1c         TSH         T4, free         CBC         Basic metabolic panel       Review of Systems:  Review of Systems  Constitutional:  Negative for chills, fever and weight loss.  Respiratory:  Negative for cough, sputum production and shortness of breath.   Cardiovascular:  Negative for chest pain, palpitations, orthopnea, claudication, leg swelling and PND.  Gastrointestinal:  Negative for abdominal pain, heartburn, nausea and vomiting.  Neurological:  Negative for dizziness and headaches.  Psychiatric/Behavioral:  The patient is not nervous/anxious.     Past Medical History:  Diagnosis Date   Arthritis    Back pain  CHRONIC LOW BACK PAIN - GETS INJECTIONS IN BACK   Constipation    Eczema    Elevated liver enzymes    Gallstones    GERD (gastroesophageal reflux disease)    Hyperlipidemia    Hypothyroidism    Lactose intolerance    Shortness of breath    Thyroid  disease     Past Surgical History:  Procedure Laterality Date   BREAST SURGERY     "PIMPLE" REMOVED FROM BREAST IN OFFICE   CESAREAN SECTION     CHOLECYSTECTOMY  12/26/2012   Procedure: LAPAROSCOPIC CHOLECYSTECTOMY WITH INTRAOPERATIVE CHOLANGIOGRAM;  Surgeon: Keitha Pata, MD;  Location: [facility];  Service: General;  Laterality: N/A;   LUMBAR FUSION N/A L4/5 04/2013     reports that she has never smoked. She has never used smokeless tobacco. She reports that she does not drink alcohol and does not use  drugs.  Allergies  Allergen Reactions   Beef-Derived Drug Products     Religion    Pork-Derived Products     religion    Family History  Problem Relation Age of Onset   Heart disease Father    Heart attack Father    Heart failure Mother    CAD Brother     Prior to Admission medications   Medication Sig Start Date End Date Taking? Authorizing Provider  acetaminophen -codeine  (TYLENOL  #3) 300-30 MG tablet Take 1-2 tablets by mouth every 8 (eight) hours as needed. 12/31/20   Arnie Lao, MD  aspirin  81 MG tablet Take 81 mg by mouth daily.    [provider]  calcium  carbonate (OS-CAL) 600 MG TABS Take 600 mg by mouth daily.    [provider]  cephALEXin (KEFLEX) 500 MG capsule Take 500 mg by mouth 4 (four) times daily. 04/28/22   [provider]  Cholecalciferol (VITAMIN D) 2000 UNITS tablet Take 2,000 Units by mouth daily.    [provider]  diclofenac  (VOLTAREN ) 75 MG EC tablet Take 1 tablet (75 mg total) by mouth 2 (two) times daily between meals as needed. 08/04/21   Arnie Lao, MD  docusate sodium (COLACE) 50 MG capsule Take by mouth 2 (two) times daily.    [provider]  fluticasone  (FLONASE) 50 MCG/ACT nasal spray Place into both nostrils. 08/29/22   [provider]  fluticasone  furoate-vilanterol (BREO ELLIPTA ) 100-25 MCG/ACT AEPB Inhale 1 puff into the lungs daily. 11/24/22   Margaretann Sharper, MD  levothyroxine  (SYNTHROID ) 88 MCG tablet Take 88 mcg by mouth every morning. 08/23/22   [provider]  levothyroxine  (SYNTHROID , LEVOTHROID) 75 MCG tablet Take 75 mcg by mouth daily before breakfast.     [provider]  magnesium gluconate (MAGONATE) 500 MG tablet Take 500 mg by mouth daily as needed. constipation    [provider]  metoprolol  succinate (TOPROL -XL) 50 MG 24 hr tablet Take 1 tablet (50 mg total) by mouth daily. Take with or immediately following a meal. 12/16/19  12/10/20  Knox Perl, MD  metoprolol  tartrate (LOPRESSOR ) 25 MG tablet Take 25 mg by mouth daily. 08/26/22   [provider]  Multiple Vitamins-Minerals (MULTIVITAMIN WITH MINERALS) tablet Take 1 tablet by mouth daily.    [provider]  nitrofurantoin, macrocrystal-monohydrate, (MACROBID) 100 MG capsule Take 100 mg by mouth 2 (two) times daily. 12/31/19   [provider]  nitroGLYCERIN  (NITROSTAT ) 0.4 MG SL tablet Place 1 tablet (0.4 mg total) under the tongue every 5 (five) minutes as needed for up  to 25 days for chest pain. 01/28/19 02/22/19  Knox Perl, MD  NON FORMULARY Spinal shot once per year-last time 01/15/13.Dr Docia Freeman in [place] [phone]    [provider]  phenazopyridine (PYRIDIUM) 200 MG tablet Take 200 mg by mouth 3 (three) times daily as needed. 12/31/19   [provider]  rosuvastatin  (CRESTOR ) 20 MG tablet TAKE 1 TABLET BY MOUTH EVERY DAY 03/31/19   Druscilla Gerhard, NP  traMADol  (ULTRAM ) 50 MG tablet Take 1 tablet (50 mg total) by mouth every 6 (six) hours as needed. 08/04/21   Arnie Lao, MD     Physical Exam: Vitals:   03/14/24 0315 03/14/24 0330 03/14/24 0345 03/14/24 0430  BP: (!) 138/56 (!) 110/48 (!) 145/58 135/72  Pulse: (!) 49 (!) 47 (!) 47 (!) 50  Resp: 18 18 (!) 22 15  Temp:      SpO2: 100% 100% 100% 100%  Weight:      Height:        Physical Exam Vitals and nursing note reviewed.  Constitutional:      Appearance: She is obese.  HENT:     Mouth/Throat:     Mouth: Mucous membranes are moist.  Eyes:     Pupils: Pupils are equal, round, and reactive to light.  Cardiovascular:     Rate and Rhythm: Regular rhythm. Bradycardia present.     Pulses: Normal pulses.     Heart sounds: Normal heart sounds.  Pulmonary:     Effort: Pulmonary effort is normal.     Breath sounds: Normal breath sounds.  Abdominal:     General: Bowel sounds are normal.  Musculoskeletal:     Cervical back: Neck supple.      Right lower leg: No edema.     Left lower leg: No edema.  Skin:    Capillary Refill: Capillary refill takes less than 2 seconds.  Neurological:     Mental Status: She is alert and oriented to person, place, and time.      Labs on Admission: I have personally reviewed following labs and imaging studies  CBC: Recent Labs  Lab 03/13/24 2107 03/14/24 0420  WBC 6.4 6.8  HGB 12.3 12.4  HCT 37.5 37.7  MCV 86.8 87.9  PLT 201 183   Basic Metabolic Panel: Recent Labs  Lab 03/13/24 2107  NA 139  K 3.8  CL 106  CO2 24  GLUCOSE 104*  BUN 10  CREATININE 0.49  CALCIUM  9.5   GFR: Estimated Creatinine Clearance: 58.5 mL/min (by C-G formula based on SCr of 0.49 mg/dL). Liver Function Tests: No results for input(s): "AST", "ALT", "ALKPHOS", "BILITOT", "PROT", "ALBUMIN" in the last 168 hours. No results for input(s): "LIPASE", "AMYLASE" in the last 168 hours. No results for input(s): "AMMONIA" in the last 168 hours. Coagulation Profile: No results for input(s): "INR", "PROTIME" in the last 168 hours. Cardiac Enzymes: Recent Labs  Lab 03/14/24 0239  TROPONINIHS 29*   BNP (last 3 results) No results for input(s): "BNP" in the last 8760 hours. HbA1C: No results for input(s): "HGBA1C" in the last 72 hours. CBG: No results for input(s): "GLUCAP" in the last 168 hours. Lipid Profile: No results for input(s): "CHOL", "HDL", "LDLCALC", "TRIG", "CHOLHDL", "LDLDIRECT" in the last 72 hours. Thyroid  Function Tests: No results for input(s): "TSH", "T4TOTAL", "FREET4", "T3FREE", "THYROIDAB" in the last 72 hours. Anemia Panel: No results for input(s): "VITAMINB12", "FOLATE", "FERRITIN", "TIBC", "IRON", "RETICCTPCT" in the last 72 hours. Urine analysis: No results found for: "COLORURINE", "  APPEARANCEUR", "LABSPEC", "PHURINE", "GLUCOSEU", "HGBUR", "BILIRUBINUR", "KETONESUR", "PROTEINUR", "UROBILINOGEN", "NITRITE", "LEUKOCYTESUR"  Radiological Exams on Admission: I have personally  reviewed images DG Chest 2 View Result Date: 03/13/2024 CLINICAL DATA:  sob findings EXAM: CHEST - 2 VIEW COMPARISON:  February 10, 2020 FINDINGS: No focal airspace consolidation, pleural effusion, or pneumothorax. No cardiomegaly. Tortuous aorta with aortic atherosclerosis. No acute fracture or destructive lesions. Multilevel degenerative disc disease of the spine. Exaggerated thoracic kyphosis, unchanged. Cholecystectomy clips. IMPRESSION: Kyphotic positioning. Otherwise, no acute cardiopulmonary abnormality. Electronically Signed   By: Rance Burrows M.D.   On: 03/13/2024 21:58     EKG: My personal interpretation of EKG shows: Sinus bradycardia heart rate 48.  There is no ST and T wave abnormality    Assessment/Plan: Principal Problem:   NSTEMI (non-ST elevated myocardial infarction) (HCC) Active Problems:   Chest discomfort   Elevated troponin   Sinus bradycardia   Essential hypertension   Chronic diastolic CHF (congestive heart failure) (HCC)   Reactive airway disease   Hypothyroidism    Assessment and Plan: NSTEMI Chest discomfort -Patient presented emergency department complaining of chest discomfort, heaviness and left-sided upper extremity tingling. -At presentation to ED patient found hypertensive blood pressure 160/68, bradycardic heart rate trended down to 44.  Elevated troponin 29. -EKG showing sinus bradycardia heart rate 48.  There is no ST-T wave abnormality. -Elevated troponin possibly demand ischemia in the context of hypotension and hypertensive episode vs nSTEMI. -Patient denies any chest pain or chest pressure. - Per chart review patient has similar episodes in 2020 during that time patient underwent Lexiscan  echocardiogram did not showed any evidence of wall motion abnormality.  Found to have grade 1 diastolic heart failure preserved EF 68% and mitral regurgitation. In the ED patient has been given aspirin  324 mg load. - Continue aspirin  81 mg daily.  Continue  Crestor . -Holding beta-blocker in the setting of bradycardia.   -Checking lipid panel and A1c. -Cardiology Dr. Jeryl Moris has been consulted recommended trend troponin, low-dose IV heparin  drip, echocardiogram and plan for heart cath today. - Given patient has NSTEMI concern for acute coronary syndrome starting IV heparin  drip.  Consulted pharmacy for IV heparin  drip - Continue to trend troponin level.  Plan to continue heparin  drip for 48 hours or until heart cath. -Will obtain echocardiogram. -Discussed with patient at the bedside.  Even though patient has religious restriction pork containing agents like heparin  and Lovenox patient agrees to start treatment with heparin  drip in case medically indicated.   Sinus bradycardia -In the ED patient heart rate into between 44-52.  EKG showing sinus bradycardia heart rate 48.  Sinus bradycardia in the setting of metoprolol  50 mg twice daily at home.  Holding metoprolol .  Elevated blood pressure Essential hypertension Diastolic heart failure preserved EF 68% -Elevated blood pressure 168/60 which has been improved to 142/60 without any intervention. - Holding metoprolol  in the setting of bradycardia. -Continue hydralazine as needed. -Obtaining echocardiogram  Reactive airway disease -Continue Breo Ellipta  once daily and albuterol as needed  Hypothyroidism -Patient daughters reported that levothyroxine  dose has been increased in November 2024 to 100 mcg daily.  Continue same dose.  Checking TSH and T4 level.   DVT prophylaxis: Starting heparin  drip. Code Status:  Full Code Diet: Currently NPO. Family Communication:   Family was present at bedside, at the time of interview. Opportunity was given to ask question and all questions were answered satisfactorily.  Disposition Plan: Plan for heart cath today Consults: Cardiology Admission status:  Inpatient, cardiac-Telemetry bed  Severity of Illness: The appropriate patient status for this  patient is INPATIENT. Inpatient status is judged to be reasonable and necessary in order to provide the required intensity of service to ensure the patient's safety. The patient's presenting symptoms, physical exam findings, and initial radiographic and laboratory data in the context of their chronic comorbidities is felt to place them at high risk for further clinical deterioration. Furthermore, it is not anticipated that the patient will be medically stable for discharge from the hospital within 2 midnights of admission.   * I certify that at the point of admission it is my clinical judgment that the patient will require inpatient hospital care spanning beyond 2 midnights from the point of admission due to high intensity of service, high risk for further deterioration and high frequency of surveillance required.Aaron Aas    Kaylie Ritter, MD [facility]  How to contact the [facility] Attending or Consulting provider 7A - 7P or covering provider during after hours 7P -7A, for this patient.  Check the care team in [facility] and look for a) attending/consulting [facility] provider listed and b) the [facility] team listed Log into [email or web address] and use [facility]'s universal password to access. If you do not have the password, please contact the hospital operator. Locate the [facility] provider you are looking for under [facility] and page to a number that you can be directly reached. If you still have difficulty reaching the provider, please page the [facility] (Director on Call) for the Hospitalists listed on amion for assistance.  03/14/2024, 5:19 AM

## 2024-03-14 NOTE — Interval H&P Note (Signed)
 History and Physical Interval Note:  03/14/2024 12:44 PM  Linda Cardenas  has presented today for surgery, with the diagnosis of nstemi.  The various methods of treatment have been discussed with the patient and family. After consideration of risks, benefits and other options for treatment, the patient has consented to  Procedure(s): LEFT HEART CATH AND CORONARY ANGIOGRAPHY (N/A) and possible coronary angioplasty as a surgical intervention.  The patient's history has been reviewed, patient examined, no change in status, stable for surgery.  I have reviewed the patient's chart and labs.  Questions were answered to the patient's satisfaction.    Cath Lab Visit (complete for each Cath Lab visit)  Clinical Evaluation Leading to the Procedure:   ACS: Yes.    Non-ACS:    Anginal Classification: CCS IV  Anti-ischemic medical therapy: No Therapy  Non-Invasive Test Results: No non-invasive testing performed  Prior CABG: No previous CABG   Koltan Portocarrero

## 2024-03-14 NOTE — ED Provider Notes (Signed)
 [place] EMERGENCY DEPARTMENT AT [facility] Provider Note   CSN: [id number] Arrival date & time: 03/13/24  2025     History  Chief Complaint  Patient presents with   chest discomfort    Linda Cardenas is a 68 y.o. female.  HPI Linda Cardenas is a 68 y.o. female who presents to the Emergency Department complaining of nausea and indigestion.  She presents to the emergency department accompanied by her daughter for evaluation of nausea and indigestion that started on Wednesday, which was initially attributed to fasting.  Yesterday she had persistent symptoms and did have some transient left arm discomfort, which prompted ED evaluation.  Daughter reports that the patient has had a lower heart rate than baseline, more in the 50s.  Her baseline is normally in the 70s.  She does take metoprolol  and has a history of hypothyroidism.  No history of coronary artery disease.     Home Medications Prior to Admission medications   Medication Sig Start Date End Date Taking? Authorizing Provider  aspirin  81 MG tablet Take 81 mg by mouth daily.   Yes [provider]  calcium  carbonate (OS-CAL) 600 MG TABS Take 600 mg by mouth daily.   Yes [provider]  Cholecalciferol (VITAMIN D) 2000 UNITS tablet Take 2,000 Units by mouth daily.   Yes [provider]  fluticasone  furoate-vilanterol (BREO ELLIPTA ) 100-25 MCG/ACT AEPB Inhale 1 puff into the lungs daily. Patient taking differently: Inhale 1 puff into the lungs daily as needed (for wheezing and shortness of breath). 11/24/22  Yes Olalere, Adewale A, MD  ibuprofen (ADVIL) 200 MG tablet Take 200-400 mg by mouth daily as needed for moderate pain (pain score 4-6).   Yes [provider]  levothyroxine  (SYNTHROID ) 100 MCG tablet Take 100 mcg by mouth daily before breakfast.   Yes [provider]  metoprolol  tartrate (LOPRESSOR ) 25 MG tablet Take 25 mg by mouth daily. 08/26/22  Yes [provider]   Multiple Vitamins-Minerals (MULTIVITAMIN WITH MINERALS) tablet Take 1 tablet by mouth daily.   Yes [provider]  rosuvastatin  (CRESTOR ) 20 MG tablet TAKE 1 TABLET BY MOUTH EVERY DAY Patient taking differently: Take 40 mg by mouth daily. 03/31/19  Yes Druscilla Gerhard, NP      Allergies    Beef-derived drug products and Pork-derived products    Review of Systems   Review of Systems  All other systems reviewed and are negative.   Physical Exam Updated Vital Signs BP (!) 143/62   Pulse (!) 44   Temp 97.7 F (36.5 C) (Oral)   Resp (!) 21   Ht 4\' 10"  (1.473 m)   Wt 74.4 kg   SpO2 100%   BMI 34.28 kg/m  Physical Exam Vitals and nursing note reviewed.  Constitutional:      Appearance: She is well-developed.  HENT:     Head: Normocephalic and atraumatic.  Cardiovascular:     Rate and Rhythm: Regular rhythm. Bradycardia present.     Heart sounds: No murmur heard. Pulmonary:     Effort: Pulmonary effort is normal. No respiratory distress.     Breath sounds: Normal breath sounds.  Abdominal:     Palpations: Abdomen is soft.     Tenderness: There is no abdominal tenderness. There is no guarding or rebound.  Musculoskeletal:        General: No tenderness.  Skin:    General: Skin is warm and dry.  Neurological:     Mental Status: She  is alert and oriented to person, place, and time.  Psychiatric:        Behavior: Behavior normal.     ED Results / Procedures / Treatments   Labs (all labs ordered are listed, but only abnormal results are displayed) Labs Reviewed  BASIC METABOLIC PANEL WITH GFR - Abnormal; Notable for the following components:      Result Value   Glucose, Bld 104 (*)    All other components within normal limits  CBC - Abnormal; Notable for the following components:   nRBC 0.3 (*)    All other components within normal limits  HEMOGLOBIN A1C - Abnormal; Notable for the following components:   Hgb A1c MFr Bld 6.0 (*)    All other  components within normal limits  TROPONIN I (HIGH SENSITIVITY) - Abnormal; Notable for the following components:   Troponin I (High Sensitivity) 29 (*)    All other components within normal limits  TROPONIN I (HIGH SENSITIVITY) - Abnormal; Notable for the following components:   Troponin I (High Sensitivity) 29 (*)    All other components within normal limits  D-DIMER, QUANTITATIVE  COMPREHENSIVE METABOLIC PANEL WITH GFR  CBC  PROTIME-INR  APTT  LIPID PANEL  TSH  T4, FREE  HIV ANTIBODY (ROUTINE TESTING W REFLEX)  HEPARIN  LEVEL (UNFRACTIONATED)  TROPONIN I (HIGH SENSITIVITY)    EKG EKG Interpretation Date/Time:  Friday March 14 2024 02:56:41 EDT Ventricular Rate:  48 PR Interval:  173 QRS Duration:  91 QT Interval:  490 QTC Calculation: 438 R Axis:   26  Text Interpretation: Sinus bradycardia Confirmed by Kelsey Patricia [id number]) on 03/14/2024 2:59:16 AM  Radiology DG Chest 2 View Result Date: 03/13/2024 CLINICAL DATA:  sob findings EXAM: CHEST - 2 VIEW COMPARISON:  February 10, 2020 FINDINGS: No focal airspace consolidation, pleural effusion, or pneumothorax. No cardiomegaly. Tortuous aorta with aortic atherosclerosis. No acute fracture or destructive lesions. Multilevel degenerative disc disease of the spine. Exaggerated thoracic kyphosis, unchanged. Cholecystectomy clips. IMPRESSION: Kyphotic positioning. Otherwise, no acute cardiopulmonary abnormality. Electronically Signed   By: Rance Burrows M.D.   On: 03/13/2024 21:58    Procedures Procedures    Medications Ordered in ED Medications  aspirin  EC tablet 81 mg (has no administration in time range)  fluticasone  furoate-vilanterol (BREO ELLIPTA ) 100-25 MCG/ACT 1 puff (has no administration in time range)  sodium chloride  flush (NS) 0.9 % injection 3 mL (has no administration in time range)  sodium chloride  flush (NS) 0.9 % injection 3 mL (has no administration in time range)  0.9 %  sodium chloride  infusion (has no  administration in time range)  acetaminophen  (TYLENOL ) tablet 650 mg (has no administration in time range)    Or  acetaminophen  (TYLENOL ) suppository 650 mg (has no administration in time range)  alum & mag hydroxide-simeth (MAALOX/MYLANTA) 200-200-20 MG/5ML suspension 30 mL (has no administration in time range)  rosuvastatin  (CRESTOR ) tablet 20 mg (20 mg Oral Given 03/14/24 0501)  hydrALAZINE (APRESOLINE) injection 5 mg (has no administration in time range)  albuterol (PROVENTIL) (2.5 MG/3ML) 0.083% nebulizer solution 2.5 mg (has no administration in time range)  levothyroxine  (SYNTHROID ) tablet 100 mcg (100 mcg Oral Given 03/14/24 0501)  heparin  ADULT infusion 100 units/mL (25000 units/250mL) (800 Units/hr Intravenous New Bag/Given 03/14/24 0610)  aspirin  chewable tablet 324 mg (324 mg Oral Given 03/14/24 0352)  heparin  bolus via infusion 3,000 Units (3,000 Units Intravenous Bolus from Bag 03/14/24 0610)    ED Course/ Medical Decision Making/ A&P  Medical Decision Making Risk OTC drugs. Decision regarding hospitalization.   Patient with history of hypertension here for evaluation of nausea, indigestion and feeling unwell.  Of note she is bradycardic.  She has bradycardia to the 40s in the emergency department.  On record review she is often in the mid 71s.  Troponin is mildly elevated at 29.  Discussed with on-call cardiologist, recommendation for admission for ongoing care.  Medicine consulted for admission.  Patient and daughter updated findings of studies and plan for admission and they are in agreement with plan.        Final Clinical Impression(s) / ED Diagnoses Final diagnoses:  Sinus bradycardia  Elevated troponin    Rx / DC Orders ED Discharge Orders     None         Kelsey Patricia, MD 03/14/24 [phone]

## 2024-03-14 NOTE — Progress Notes (Signed)
 PROGRESS NOTE  Linda Cardenas ZOX:[id number] DOB: July 08, 1956   PCP: Madden, Evan J, MD  Patient is from: Home.  DOA: 03/13/2024 LOS: 0  Chief complaints Chief Complaint  Patient presents with   chest discomfort     Brief Narrative / Interim history: 68 year old F with PMH of HTN, reactive airway disease, osteoarthritis, hypothyroidism, HFpEF and hyperlipidemia presenting with chest discomfort that she describes as indigestion, dizziness and left arm tingling, and admitted with working diagnosis of unstable angina/non-STEMI.   In ED, bradycardic in 40s to low 50s.  EKG without acute ST/T wave change.  High-sensitivity troponin 29 x 2.  CXR without acute finding.  D-dimer negative.  Cardiology consulted.  Patient was started on IV heparin  with plan for left heart catheterization.  Echocardiogram pending.  Subjective: Seen and examined earlier this morning.  No major events this morning.  No further chest pain.  Feels tired.  Denies shortness of breath, palpitation or dizziness.  Objective: Vitals:   03/14/24 0611 03/14/24 0625 03/14/24 0630 03/14/24 0900  BP: (!) 143/62  (!) 144/65 (!) 158/68  Pulse: (!) 44  (!) 44 (!) 43  Resp: (!) 21  17 18   Temp:  97.7 F (36.5 C)  97.9 F (36.6 C)  TempSrc:  Oral    SpO2: 100%  100% 100%  Weight:      Height:        Examination:  GENERAL: No apparent distress.  Nontoxic. HEENT: MMM.  Vision and hearing grossly intact.  NECK: Supple.  No apparent JVD.  RESP:  No IWOB.  Fair aeration bilaterally. CVS: Bradycardic to 40s.  Regular rhythm.  Heart sounds normal.  ABD/GI/GU: BS+. Abd soft, NTND.  MSK/EXT:  Moves extremities. No apparent deformity.  Trace BLE edema. SKIN: no apparent skin lesion or wound NEURO: Awake, alert and oriented appropriately.  No apparent focal neuro deficit. PSYCH: Calm. Normal affect.   Consultants:  Cardiology  Procedures: None  Microbiology summarized: None  Assessment and plan: Atypical chest  pain/non-STEMI: Presents with chest discomfort, dizziness and left arm tingling.  Symptoms resolved.  Bradycardic to 40s.  High-sensitivity troponin 29 x 2.  EKG without acute ST/T wave changes.  A1c 6.0%.  LDL 48.  Received full dose aspirin .  Started on IV heparin  after consultation with cardiology. -Continue IV heparin , added on aspirin  -Follow echocardiogram -Plan for LHC today -Consider PPI if LHC unrevealing given NSAID use    Sinus bradycardia: Due to metoprolol ?  TSH and free T4 within normal. -Will check chronotropic effect with exertion after LHC -Continue holding metoprolol .  Consider alternative antihypertensive med   Essential hypertension: BP slightly elevated.  On low-dose metoprolol  at home -Continue sitter alternative antihypertensive med given bradycardia  Chronic HFpEF: TTE in 2020 with LVEF of 68%, G1-DD.  Per daughter, chronic LE edema unchanged from baseline.  She has trace edema on exam.  CXR without acute finding.  Not on diuretics at home. -Follow echocardiogram   Reactive airway disease -Continue Breo Ellipta  once daily and albuterol as needed   Hypothyroidism: TSH and free T4 within normal. -Continue home Synthroid   Prediabetes: A1c 6.0%. - Encourage lifestyle change   Class I obesity Body mass index is 34.28 kg/m. - Encourage lifestyle change to lose weight          DVT prophylaxis:  Place and maintain sequential compression device Start: 03/14/24 0524 SCDs Start: 03/14/24 0420 Place TED hose Start: 03/14/24 0420  Code Status: Full code Family Communication: Updated patient's daughter at bedside Level  of care: Telemetry Cardiac Status is: Inpatient Remains inpatient appropriate because: Unstable angina/non-STEMI   Final disposition: Home   50 minutes with more than 50% spent in reviewing records, counseling patient/family and coordinating care.   Sch Meds:  Scheduled Meds:  [START ON 03/15/2024] aspirin   81 mg Oral Pre-Cath   [START  ON 03/15/2024] aspirin  EC  81 mg Oral Daily   fluticasone  furoate-vilanterol  1 puff Inhalation Daily   levothyroxine   100 mcg Oral Q0600   rosuvastatin   20 mg Oral Daily   sodium chloride  flush  3 mL Intravenous Q12H   Continuous Infusions:  sodium chloride      sodium chloride  40 mL/hr at 03/14/24 1017   heparin  800 Units/hr (03/14/24 0610)   PRN Meds:.sodium chloride , acetaminophen  **OR** acetaminophen , albuterol, alum & mag hydroxide-simeth, hydrALAZINE, sodium chloride  flush  Antimicrobials: Anti-infectives (From admission, onward)    None        I have personally reviewed the following labs and images: CBC: Recent Labs  Lab 03/13/24 2107 03/14/24 0420  WBC 6.4 6.8  HGB 12.3 12.4  HCT 37.5 37.7  MCV 86.8 87.9  PLT 201 183   BMP &GFR Recent Labs  Lab 03/13/24 2107 03/14/24 0420  NA 139 140  K 3.8 3.8  CL 106 105  CO2 24 24  GLUCOSE 104* 92  BUN 10 8  CREATININE 0.49 0.51  CALCIUM  9.5 9.4   Estimated Creatinine Clearance: 58.5 mL/min (by C-G formula based on SCr of 0.51 mg/dL). Liver & Pancreas: Recent Labs  Lab 03/14/24 0420  AST 28  ALT 24  ALKPHOS 42  BILITOT 0.7  PROT 6.8  ALBUMIN 3.7   No results for input(s): "LIPASE", "AMYLASE" in the last 168 hours. No results for input(s): "AMMONIA" in the last 168 hours. Diabetic: Recent Labs    03/14/24 0443  HGBA1C 6.0*   No results for input(s): "GLUCAP" in the last 168 hours. Cardiac Enzymes: No results for input(s): "CKTOTAL", "CKMB", "CKMBINDEX", "TROPONINI" in the last 168 hours. No results for input(s): "PROBNP" in the last 8760 hours. Coagulation Profile: Recent Labs  Lab 03/14/24 0420  INR 1.1   Thyroid  Function Tests: Recent Labs    03/14/24 0420 03/14/24 0443  TSH  --  0.953  FREET4 1.09  --    Lipid Profile: Recent Labs    03/14/24 0443  CHOL 145  HDL 68  LDLCALC 67  TRIG 48  CHOLHDL 2.1   Anemia Panel: No results for input(s): "VITAMINB12", "FOLATE", "FERRITIN",  "TIBC", "IRON", "RETICCTPCT" in the last 72 hours. Urine analysis: No results found for: "COLORURINE", "APPEARANCEUR", "LABSPEC", "PHURINE", "GLUCOSEU", "HGBUR", "BILIRUBINUR", "KETONESUR", "PROTEINUR", "UROBILINOGEN", "NITRITE", "LEUKOCYTESUR" Sepsis Labs: Invalid input(s): "PROCALCITONIN", "LACTICIDVEN"  Microbiology: No results found for this or any previous visit (from the past 240 hours).  Radiology Studies: DG Chest 2 View Result Date: 03/13/2024 CLINICAL DATA:  sob findings EXAM: CHEST - 2 VIEW COMPARISON:  February 10, 2020 FINDINGS: No focal airspace consolidation, pleural effusion, or pneumothorax. No cardiomegaly. Tortuous aorta with aortic atherosclerosis. No acute fracture or destructive lesions. Multilevel degenerative disc disease of the spine. Exaggerated thoracic kyphosis, unchanged. Cholecystectomy clips. IMPRESSION: Kyphotic positioning. Otherwise, no acute cardiopulmonary abnormality. Electronically Signed   By: Rance Burrows M.D.   On: 03/13/2024 21:58      Hendrick Pavich T. Marcianna Daily [facility]  If 7PM-7AM, please contact night-coverage [email or web address] 03/14/2024, 10:20 AM

## 2024-03-14 NOTE — ED Notes (Signed)
 Patient agreeable to waiting in bed, placed on cardiac monitor, Sinus bradycardia on the monitor. Daughter at bedside. Call light within reach, warm blankets given.

## 2024-03-14 NOTE — ED Notes (Addendum)
 Pt unhooked from monitor by daughter and walked to bathroom at this time. MD Sundil discussed IV heparin  therapy with patient and patient's daughter. No questions or concerns voiced to this RN at this time.

## 2024-03-14 NOTE — Progress Notes (Signed)
 PHARMACY - ANTICOAGULATION CONSULT NOTE  Pharmacy Consult for heparin  Indication: chest pain/ACS  Allergies  Allergen Reactions   Beef-Derived Drug Products     Religion    Pork-Derived Products     religion    Patient Measurements: Height: 4\' 10"  (147.3 cm) Weight: 74.4 kg (164 lb 0.4 oz) IBW/kg (Calculated) : 40.9 HEPARIN  DW (KG): 58.1  Vital Signs: Temp: 97.9 F (36.6 C) (04/25 0900) Temp Source: Oral (04/25 0625) BP: 158/68 (04/25 0900) Pulse Rate: 43 (04/25 0900)  Labs: Recent Labs    03/13/24 2107 03/14/24 0239 03/14/24 0420 03/14/24 0443 03/14/24 0747 03/14/24 1010 03/14/24 1132  HGB 12.3  --  12.4  --   --   --   --   HCT 37.5  --  37.7  --   --   --   --   PLT 201  --  183  --   --   --   --   APTT  --   --  30  --   --   --   --   LABPROT  --   --  14.3  --   --   --   --   INR  --   --  1.1  --   --   --   --   HEPARINUNFRC  --   --   --   --   --   --  0.54  CREATININE 0.49  --  0.51  --   --   --   --   TROPONINIHS  --    < >  --  29* 27* 29*  --    < > = values in this interval not displayed.    Estimated Creatinine Clearance: 58.5 mL/min (by C-G formula based on SCr of 0.51 mg/dL).   Medical History: Past Medical History:  Diagnosis Date   Arthritis    Back pain     CHRONIC LOW BACK PAIN - GETS INJECTIONS IN BACK   Constipation    Eczema    Elevated liver enzymes    Gallstones    GERD (gastroesophageal reflux disease)    Hyperlipidemia    Hypothyroidism    Lactose intolerance    Shortness of breath    Thyroid  disease     Assessment: 68yo female c/o substernal chest discomfort associated w/ nausea, malaise, dizziness, and LUE "tingling"; initial troponin mildly elevated >> to begin heparin ; admitting MD d/w pt who agrees to start heparin  despite religious preference regarding pork products.  Initial heparin  level therapeutic at 0.54. Cath planned later today so will defer recheck.  Goal of Therapy:  Heparin  level 0.3-0.7  units/ml Monitor platelets by anticoagulation protocol: Yes   Plan:  Continue heparin  800 units/h F/U post/cath  Levin Reamer, PharmD, BCPS, [facility] Clinical Pharmacist [phone] Please check AMION for all [facility] numbers 03/14/2024

## 2024-03-14 NOTE — H&P (View-Only) (Signed)
   Patient Name: Linda Cardenas Date of Encounter: 03/14/2024 [facility] Cardiologist: None   Interval Summary  .    Resting comfortably this morning, no chest pain overnight.   Vital Signs .    Vitals:   03/14/24 0530 03/14/24 0611 03/14/24 0625 03/14/24 0630  BP: (!) 141/73 (!) 143/62  (!) 144/65  Pulse: (!) 45 (!) 44  (!) 44  Resp: 16 (!) 21  17  Temp:   97.7 F (36.5 C)   TempSrc:   Oral   SpO2: 100% 100%  100%  Weight:      Height:       No intake or output data in the 24 hours ending 03/14/24 0725    03/13/2024    9:01 PM 03/13/2024    7:43 PM 09/07/2022    3:51 PM  Last 3 Weights  Weight (lbs) 164 lb 0.4 oz 164 lb 167 lb 3.2 oz  Weight (kg) 74.4 kg 74.39 kg 75.841 kg      Telemetry/ECG    Sinus Bradycardia - Personally Reviewed  Physical Exam .    GEN: No acute distress.   Neck: No JVD Cardiac: Bradycardia, no murmurs, rubs, or gallops.  Respiratory: Clear to auscultation bilaterally. GI: Soft, nontender, non-distended  MS: No edema  Assessment & Plan .     68 y.o. female with a hx of chronic osteoarthritis bilateral knee joint, reactive airway disease, hyperlipidemia, hypothyroidism and goiter, chronic back pain, essential hypertension and grade 1 diastolic heart failure with preserved EF 68%  who was seen 03/14/2024 for the evaluation of chest pain, indigestion symptoms, bradycardia at the request of Dr. Monique Ano and Dr. Sundil.   Exertional dyspnea w/ elevated troponin -- talking with daughter at the bedside, she reports a decline in her mothers status over the past couple of months. Says she usual to be much more active, but now doesn't seem the have the energy to do much. Having to sit and rest often, no true chest pain but exertional dyspnea. Does report hx family of pts father having an MI at 53 yo -- hsTn 29>>29, EKG sinus bradycardia with no ST/T wave changes -- given her symptoms, recommendations for cardiac cath after discussion with overnight  MD. Initially concerned regarding heparin  (religious concerns) but was agreeable.  -- remains on IV heparin ,  ASA, crestor   -- echo pending  Informed Consent   Shared Decision Making/Informed Consent The risks [stroke (1 in 1000), death (1 in 1000), kidney failure [usually temporary] (1 in 500), bleeding (1 in 200), allergic reaction [possibly serious] (1 in 200)], benefits (diagnostic support and management of coronary artery disease) and alternatives of a cardiac catheterization were discussed in detail with Ms. Balicki and she is willing to proceed.    HTN -- elevated in the ED, home metoprolol  held with bradycardia -- further recommendations pending work up  HLD -- HDL 68, LDL 67 -- on Crestor  20mg  daily   Bradycardia -- home metoprolol  held -- TSH 0.953  For questions or updates, please contact [facility] Please consult [email or web address] for contact info under        Signed, Johnie Nailer, NP

## 2024-03-14 NOTE — ED Notes (Signed)
 Pt daughter specifically asked this RN for no medications made with pig/pork based ingredients for religious reasons. MD Monique Ano notified.

## 2024-03-14 NOTE — ED Notes (Signed)
 Patient transported to Ultrasound

## 2024-03-14 NOTE — Progress Notes (Addendum)
 PHARMACY - ANTICOAGULATION CONSULT NOTE  Pharmacy Consult for heparin  Indication: chest pain/ACS  Allergies  Allergen Reactions   Beef-Derived Drug Products     Religion    Pork-Derived Products     religion    Patient Measurements: Height: 4\' 10"  (147.3 cm) Weight: 74.4 kg (164 lb 0.4 oz) IBW/kg (Calculated) : 40.9 HEPARIN  DW (KG): 58.1  Vital Signs: Temp: 97.7 F (36.5 C) (04/25 0216) Temp Source: Oral (04/24 1946) BP: 135/72 (04/25 0430) Pulse Rate: 50 (04/25 0430)  Labs: Recent Labs    03/13/24 2107 03/14/24 0239 03/14/24 0420  HGB 12.3  --  12.4  HCT 37.5  --  37.7  PLT 201  --  183  CREATININE 0.49  --   --   TROPONINIHS  --  29*  --     Estimated Creatinine Clearance: 58.5 mL/min (by C-G formula based on SCr of 0.49 mg/dL).   Medical History: Past Medical History:  Diagnosis Date   Arthritis    Back pain     CHRONIC LOW BACK PAIN - GETS INJECTIONS IN BACK   Constipation    Eczema    Elevated liver enzymes    Gallstones    GERD (gastroesophageal reflux disease)    Hyperlipidemia    Hypothyroidism    Lactose intolerance    Shortness of breath    Thyroid  disease     Assessment: 68yo female c/o substernal chest discomfort associated w/ nausea, malaise, dizziness, and LUE "tingling"; initial troponin mildly elevated >> to begin heparin ; admitting MD d/w pt who agrees to start heparin  despite religious preference regarding pork products.  Goal of Therapy:  Heparin  level 0.3-0.7 units/ml Monitor platelets by anticoagulation protocol: Yes   Plan:  Heparin  3000 units IV bolus followed by infusion at 800 units/hr. Monitor heparin  levels and CBC.  Lonnie Roberts, PharmD, BCPS  03/14/2024,5:21 AM

## 2024-03-14 NOTE — ED Notes (Signed)
 Patient daughter at bedside. Patient daughter requesting that patient stay in wheelchair, Requesting to speak with MD to discuss discharge and outpatient follow up with cardiology. Patient denies any chest pain at this time.

## 2024-03-14 NOTE — Progress Notes (Signed)
   Patient Name: Linda Cardenas Date of Encounter: 03/14/2024 [facility] Cardiologist: None   Interval Summary  .    Resting comfortably this morning, no chest pain overnight.   Vital Signs .    Vitals:   03/14/24 0530 03/14/24 0611 03/14/24 0625 03/14/24 0630  BP: (!) 141/73 (!) 143/62  (!) 144/65  Pulse: (!) 45 (!) 44  (!) 44  Resp: 16 (!) 21  17  Temp:   97.7 F (36.5 C)   TempSrc:   Oral   SpO2: 100% 100%  100%  Weight:      Height:       No intake or output data in the 24 hours ending 03/14/24 0725    03/13/2024    9:01 PM 03/13/2024    7:43 PM 09/07/2022    3:51 PM  Last 3 Weights  Weight (lbs) 164 lb 0.4 oz 164 lb 167 lb 3.2 oz  Weight (kg) 74.4 kg 74.39 kg 75.841 kg      Telemetry/ECG    Sinus Bradycardia - Personally Reviewed  Physical Exam .    GEN: No acute distress.   Neck: No JVD Cardiac: Bradycardia, no murmurs, rubs, or gallops.  Respiratory: Clear to auscultation bilaterally. GI: Soft, nontender, non-distended  MS: No edema  Assessment & Plan .     68 y.o. female with a hx of chronic osteoarthritis bilateral knee joint, reactive airway disease, hyperlipidemia, hypothyroidism and goiter, chronic back pain, essential hypertension and grade 1 diastolic heart failure with preserved EF 68%  who was seen 03/14/2024 for the evaluation of chest pain, indigestion symptoms, bradycardia at the request of Dr. Monique Ano and Dr. Sundil.   Exertional dyspnea w/ elevated troponin -- talking with daughter at the bedside, she reports a decline in her mothers status over the past couple of months. Says she usual to be much more active, but now doesn't seem the have the energy to do much. Having to sit and rest often, no true chest pain but exertional dyspnea. Does report hx family of pts father having an MI at 53 yo -- hsTn 29>>29, EKG sinus bradycardia with no ST/T wave changes -- given her symptoms, recommendations for cardiac cath after discussion with overnight  MD. Initially concerned regarding heparin  (religious concerns) but was agreeable.  -- remains on IV heparin ,  ASA, crestor   -- echo pending  Informed Consent   Shared Decision Making/Informed Consent The risks [stroke (1 in 1000), death (1 in 1000), kidney failure [usually temporary] (1 in 500), bleeding (1 in 200), allergic reaction [possibly serious] (1 in 200)], benefits (diagnostic support and management of coronary artery disease) and alternatives of a cardiac catheterization were discussed in detail with Ms. Balicki and she is willing to proceed.    HTN -- elevated in the ED, home metoprolol  held with bradycardia -- further recommendations pending work up  HLD -- HDL 68, LDL 67 -- on Crestor  20mg  daily   Bradycardia -- home metoprolol  held -- TSH 0.953  For questions or updates, please contact [facility] Please consult [email or web address] for contact info under        Signed, Johnie Nailer, NP

## 2024-03-14 NOTE — Consult Note (Addendum)
 Cardiology Consultation   Patient ID: Linda Cardenas MRN: [id number]; DOB: 1956-06-13  Admit date: 03/13/2024 Date of Consult: 03/14/2024  PCP:  Arlys Berke, MD   [facility] Providers Cardiologist:  None        Patient Profile:   Linda Cardenas is a 68 y.o. female with a hx of chronic osteoarthritis bilateral knee joint, reactive airway disease, hyperlipidemia, hypothyroidism and goiter, chronic back pain, essential hypertension and grade 1 diastolic heart failure with preserved EF 68%  who is being seen 03/14/2024 for the evaluation of chest pain, indigestion symptoms, bradycardia at the request of Dr. Monique Ano and Dr. Sundil.  History of Present Illness:   Linda Cardenas is a 68 y.o. 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 Per patient's daughter and patient she has been having exertional shortness of breath, some chest tightness yesterday she noticed GERD like symptoms which comes and goes but 1 persisted throughout and then associated with severe exertional chest tightness as well as shortness of breath, also associated with left arm tingling numbness which led her to come to the hospital.  Patient is a poor historian but reports that she is not very active at baseline, has a longstanding history of for some exertional dyspnea but this has gotten worse. She also has intermittent GERD like symptoms initially thought to be acid reflux.  Has leg edema but no orthopnea per se, no recent sick contacts.  In the ER blood pressure was elevated 168/68 mmHg, she is bradycardic, EKG is normal sinus bradycardia, heart rate 48 bpm. First set of troponin is  elevated at 29. Chest x-ray does not show pulmonary edema     Past Medical History:  Diagnosis Date   Arthritis    Back pain     CHRONIC LOW BACK PAIN - GETS INJECTIONS IN BACK   Constipation    Eczema    Elevated liver enzymes    Gallstones    GERD (gastroesophageal reflux disease)    Hyperlipidemia    Hypothyroidism    Lactose intolerance    Shortness of breath    Thyroid  disease     Past Surgical History:  Procedure Laterality Date   BREAST SURGERY     "PIMPLE" REMOVED FROM BREAST IN OFFICE   CESAREAN SECTION     CHOLECYSTECTOMY  12/26/2012   Procedure: LAPAROSCOPIC CHOLECYSTECTOMY WITH INTRAOPERATIVE CHOLANGIOGRAM;  Surgeon: Keitha Pata, MD;  Location: [facility];  Service: General;  Laterality: N/A;   LUMBAR FUSION N/A L4/5 04/2013       Inpatient Medications: Scheduled Meds:  [START ON 03/15/2024] aspirin  EC  81 mg Oral Daily   fluticasone  furoate-vilanterol  1 puff Inhalation Daily   heparin   3,000 Units Intravenous Once   levothyroxine   100 mcg Oral Q0600   rosuvastatin   20 mg Oral Daily   sodium chloride  flush  3 mL Intravenous Q12H   Continuous Infusions:  sodium chloride      heparin      PRN Meds: sodium chloride , acetaminophen  **OR** acetaminophen , albuterol, alum & mag hydroxide-simeth, hydrALAZINE, sodium chloride  flush  Allergies:    Allergies  Allergen Reactions   Beef-Derived Drug Products     Religion    Pork-Derived Products  religion    Social History:   Social History   Socioeconomic History   Marital status: Married    Spouse name: Not on file   Number of children: 2   Years of education: Not on file   Highest education level: Not on file  Occupational History   Not on file  Tobacco Use   Smoking status: Never   Smokeless tobacco: Never  Vaping Use   Vaping status: Never Used  Substance and Sexual Activity   Alcohol use: No   Drug use: No   Sexual activity: Not on file  Other Topics Concern   Not on file  Social  History Narrative   Not on file   Social Drivers of Health   Financial Resource Strain: Not on file  Food Insecurity: Not on file  Transportation Needs: Not on file  Physical Activity: Not on file  Stress: Not on file  Social Connections: Not on file  Intimate Partner Violence: Not on file    Family History:    Family History  Problem Relation Age of Onset   Heart disease Father    Heart attack Father    Heart failure Mother    CAD Brother      ROS:  Please see the history of present illness.   All other ROS reviewed and negative.     Physical Exam/Data:   Vitals:   03/14/24 0330 03/14/24 0345 03/14/24 0430 03/14/24 0500  BP: (!) 110/48 (!) 145/58 135/72 (!) 151/66  Pulse: (!) 47 (!) 47 (!) 50 (!) 46  Resp: 18 (!) 22 15 17   Temp:      SpO2: 100% 100% 100% 100%  Weight:      Height:       No intake or output data in the 24 hours ending 03/14/24 0528    03/13/2024    9:01 PM 03/13/2024    7:43 PM 09/07/2022    3:51 PM  Last 3 Weights  Weight (lbs) 164 lb 0.4 oz 164 lb 167 lb 3.2 oz  Weight (kg) 74.4 kg 74.39 kg 75.841 kg     Body mass index is 34.28 kg/m.  General:  Well nourished, well developed, in no acute distress HEENT: normal Neck: no JVD Vascular: No carotid bruits; Distal pulses 2+ bilaterally Cardiac:  normal S1, S2; RRR; no murmur  Lungs:  clear to auscultation bilaterally, no wheezing, rhonchi or rales  Abd: soft, nontender, no hepatomegaly  Ext: 2+ edema Musculoskeletal:  No deformities, BUE and BLE strength normal and equal Skin: warm and dry  Neuro:  CNs 2-12 intact, no focal abnormalities noted Psych:  Normal affect   EKG:  The EKG was personally reviewed and demonstrates: Sinus bradycardia Telemetry:  Telemetry was personally reviewed and demonstrates: Sinus bradycardia  Relevant CV Studies: Echo 2020 Echocardiogram 01/07/2019: Left ventricle cavity is normal in size. Mild concentric hypertrophy of the left ventricle. Normal global  wall motion. Doppler evidence of grade I (impaired) diastolic dysfunction, normal LAP. Calculated EF 68%. Mild (Grade I) mitral regurgitation. Inadequate TR jet to estimate pulmonary artery systolic pressure. IVC is dilated with respiratory variation. This may suggest elevated right heart pressure    Laboratory Data:  High Sensitivity Troponin:   Recent Labs  Lab 03/14/24 0239  TROPONINIHS 29*     Chemistry Recent Labs  Lab 03/13/24 2107  NA 139  K 3.8  CL 106  CO2 24  GLUCOSE 104*  BUN 10  CREATININE 0.49  CALCIUM  9.5  GFRNONAA >  60  ANIONGAP 9    No results for input(s): "PROT", "ALBUMIN", "AST", "ALT", "ALKPHOS", "BILITOT" in the last 168 hours. Lipids No results for input(s): "CHOL", "TRIG", "HDL", "LABVLDL", "LDLCALC", "CHOLHDL" in the last 168 hours.  Hematology Recent Labs  Lab 03/13/24 2107 03/14/24 0420  WBC 6.4 6.8  RBC 4.32 4.29  HGB 12.3 12.4  HCT 37.5 37.7  MCV 86.8 87.9  MCH 28.5 28.9  MCHC 32.8 32.9  RDW 13.9 14.1  PLT 201 183   Thyroid  No results for input(s): "TSH", "FREET4" in the last 168 hours.  BNPNo results for input(s): "BNP", "PROBNP" in the last 168 hours.  DDimer  Recent Labs  Lab 03/13/24 2107  DDIMER 0.33     Radiology/Studies:  DG Chest 2 View Result Date: 03/13/2024 CLINICAL DATA:  sob findings EXAM: CHEST - 2 VIEW COMPARISON:  February 10, 2020 FINDINGS: No focal airspace consolidation, pleural effusion, or pneumothorax. No cardiomegaly. Tortuous aorta with aortic atherosclerosis. No acute fracture or destructive lesions. Multilevel degenerative disc disease of the spine. Exaggerated thoracic kyphosis, unchanged. Cholecystectomy clips. IMPRESSION: Kyphotic positioning. Otherwise, no acute cardiopulmonary abnormality. Electronically Signed   By: Rance Burrows M.D.   On: 03/13/2024 21:58     Assessment and Plan:   Unstable anginal symptoms. Elevated troponin 29/NSTEMI Sinus bradycardia hold  beta-blocker Hypertension. Hyperlipidemia, possible OSA. Hypothyroidism, obesity  Plan: -> Patient is getting admitted under medicine team, cardiology consulted. -->Given her symptoms of exertional chest tightness, indigestion symptoms at rest and very worried about unstable angina, her troponin is elevated at 29 with no apparent cause other than probably ACS, although her blood pressure is slightly high on admission 168/68 mmHg.  She got full dose aspirin , recommend initiation of IV heparin .  However patient and daughter are very worried that heparin  has pork and they do not want to pork related products due to religious believes and are very hesitant.  I explained to the patient and daughter that for heart catheterization we absolutely need to use heparin . There are other products like bivalirudin or fondaparinux, probably bivalirudin could be considered if they are still very hesitant .  But daughter understands --> recheck troponin and very low-dose scheduled to initiate IV heparin  -->Continue to trend EKG and troponin.  Baseline EKG sinus bradycardia. Currently patient is chest pain-free.  Continue aspirin  hold beta-blocker given bradycardia -->Keep her n.p.o. for possible cardiac catheterization this morning Echo in the morning, continue telemetry Full code  Will follow along   Risk Assessment/Risk Scores:     TIMI Risk Score for Unstable Angina or Non-ST Elevation MI:   The patient's TIMI risk score is 5, which indicates a 26% risk of all cause mortality, new or recurrent myocardial infarction or need for urgent revascularization in the next 14 days.          For questions or updates, please contact [facility] Please consult [email or web address] for contact info under    Signed, Cranston Dk, MD  03/14/2024 5:28 AM

## 2024-03-14 NOTE — Consult Note (Addendum)
 [address]       [place] [address]             [phone]        Ayris Carano [facility] Medical Record #[id number] Date of Birth: August 29, 1956  Referring: No ref. provider found Primary Care: Arlys Berke, MD Primary Cardiologist:None  Chief Complaint:    Chief Complaint  Patient presents with   chest discomfort    History of Present Illness:    We are asked to see this 68 year old female in CT surgical consultation for consideration of CABG.  She has multiple medical comorbidities including cardiac risk factors for hyperlipidemia, hypertension, bradycardia and grade 1 diastolic heart dysfunction with preserved EF 60% who was recently evaluated for chest pain and indigestion.  Recently it is noted by her family that she has significantly less energy and a general decline in her health status.  She has not had any recent specific complaints of chest pain but does have exertional dyspnea and has to rest frequently.  She did develop chest pain today and originally presented to urgent care.  She was complaining primarily of substernal chest discomfort which she felt was primarily indigestion as it was associated with nausea.  Symptoms initially presented 2 days ago but today she began having dizziness as well as some left arm tingling.  On presentation to the ED she was found to have hypertension as well as bradycardia.  D-dimer was normal and troponin I was mildly elevated at 29 ruling in for non-STEMI.  Troponins are being trended.  She was started on heparin .  Her metoprolol  was placed on hold.  EKG showed a sinus bradycardia with a rate of 48.  Chest x-ray showed no acute abnormality other than kyphotic positioning.  She was felt to require admission for further evaluation and medical stabilization to include cardiology consultation.  Both echocardiogram and cardiac catheterization have been performed.  On echo her LV EF is estimated at 60 to 65%.  There are no  regional left ventricular wall abnormalities.  Right ventricular systolic function is normal.  Left atrium is noted to be moderately dilated.  There is no significant mitral or aortic valve stenosis/regurgitation.  On cardiac catheterization she is found to have moderately calcified coronary arteries with significant at least 70% left main disease extending into the circumflex and proximal LAD.  She otherwise has mild disease.  She has significant arthritis and chronic back pain which may be  limiting in terms of her postsurgical rehab. .    Current Activity/ Functional Status: Patient is independent with mobility/ambulation, transfers, ADL's, IADL's.  She does use a walker at times , especially to get out of bed Zubrod Score: At the time of surgery this patient's most appropriate activity status/level should be described as: []     0    Normal activity, no symptoms [x]     1    Restricted in physical strenuous activity but ambulatory, able to do out light work []     2    Ambulatory and capable of self care, unable to do work activities, up and about                 more than 50%  Of the time                            []     3    Only limited self care,  in bed greater than 50% of waking hours []     4    Completely disabled, no self care, confined to bed or chair []     5    Moribund  Past Medical History:  Diagnosis Date   Arthritis    Back pain     CHRONIC LOW BACK PAIN - GETS INJECTIONS IN BACK   Constipation    Eczema    Elevated liver enzymes    Gallstones    GERD (gastroesophageal reflux disease)    Hyperlipidemia    Hypothyroidism    Lactose intolerance    Shortness of breath    Thyroid  disease     Past Surgical History:  Procedure Laterality Date   BREAST SURGERY     "PIMPLE" REMOVED FROM BREAST IN OFFICE   CESAREAN SECTION     CHOLECYSTECTOMY  12/26/2012   Procedure: LAPAROSCOPIC CHOLECYSTECTOMY WITH INTRAOPERATIVE CHOLANGIOGRAM;  Surgeon: Keitha Pata, MD;  Location: [facility]  [facility];  Service: General;  Laterality: N/A;   LUMBAR FUSION N/A L4/5 04/2013  Bilat cateracts Spinal surgery- lumbar  Social History   Tobacco Use  Smoking Status Never  Smokeless Tobacco Never    Social History   Substance and Sexual Activity  Alcohol Use No     Allergies  Allergen Reactions   Beef-Derived Drug Products     Religion    Pork-Derived Products     religion    Current Facility-Administered Medications  Medication Dose Route Frequency Provider Last Rate Last Admin   [MAR Hold] 0.9 %  sodium chloride  infusion  250 mL Intravenous PRN Sundil, Subrina, MD       0.9 %  sodium chloride  infusion   Intravenous Continuous Knox Perl, MD 40 mL/hr at 03/14/24 1017 New Bag at 03/14/24 1017   [MAR Hold] acetaminophen  (TYLENOL ) tablet 650 mg  650 mg Oral Q6H PRN Sundil, Subrina, MD       Or   Evette Hoes Hold] acetaminophen  (TYLENOL ) suppository 650 mg  650 mg Rectal Q6H PRN Sundil, Subrina, MD       [MAR Hold] albuterol  (PROVENTIL ) (2.5 MG/3ML) 0.083% nebulizer solution 2.5 mg  2.5 mg Nebulization Q6H PRN Sundil, Subrina, MD       [MAR Hold] alum & mag hydroxide-simeth (MAALOX/MYLANTA) 200-200-20 MG/5ML suspension 30 mL  30 mL Oral Q6H PRN Sundil, Subrina, MD       [START ON 03/15/2024] aspirin  chewable tablet 81 mg  81 mg Oral Pre-Cath Ganji, Jay, MD       [facility] Hold] aspirin  EC tablet 81 mg  81 mg Oral Daily Sundil, Subrina, MD       fentaNYL  (SUBLIMAZE ) injection    PRN Ganji, Jay, MD   50 mcg at 03/14/24 1310   [MAR Hold] fluticasone  furoate-vilanterol (BREO ELLIPTA ) 100-25 MCG/ACT 1 puff  1 puff Inhalation Daily Sundil, Subrina, MD       Heparin  (Porcine) in NaCl 1000-0.9 UT/500ML-% SOLN    PRN Ganji, Jay, MD   500 mL at 03/14/24 1249   heparin  ADULT infusion 100 units/mL (25000 units/250mL)  800 Units/hr Intravenous Continuous Lynna Sarks, RPH   Stopped at 03/14/24 1230   heparin  sodium (porcine) injection    PRN Ganji, Jay, MD   5,000 Units at 03/14/24 1307   [MAR Hold]  hydrALAZINE  (APRESOLINE ) injection 5 mg  5 mg Intravenous Q6H PRN Sundil, Subrina, MD       iohexol  (OMNIPAQUE ) 350 MG/ML injection    PRN Knox Perl, MD   30 mL  at 03/14/24 1325   [MAR Hold] levothyroxine  (SYNTHROID ) tablet 100 mcg  100 mcg Oral Q0600 Sundil, Subrina, MD   100 mcg at 03/14/24 0501   lidocaine  (PF) (XYLOCAINE ) 1 % injection    PRN Knox Perl, MD   2 mL at 03/14/24 1253   midazolam  (VERSED ) injection    PRN Ganji, Jay, MD   2 mg at 03/14/24 1249   Radial Cocktail/Verapamil  only    PRN Ganji, Jay, MD   10 mL at 03/14/24 1305   [MAR Hold] rosuvastatin  (CRESTOR ) tablet 20 mg  20 mg Oral Daily Sundil, Subrina, MD   20 mg at 03/14/24 0501   [MAR Hold] sodium chloride  flush (NS) 0.9 % injection 3 mL  3 mL Intravenous Q12H Sundil, Subrina, MD   3 mL at 03/14/24 1017   [MAR Hold] sodium chloride  flush (NS) 0.9 % injection 3 mL  3 mL Intravenous PRN Sundil, Subrina, MD        Medications Prior to Admission  Medication Sig Dispense Refill Last Dose/Taking   aspirin  81 MG tablet Take 81 mg by mouth daily.   03/13/2024 Evening   calcium  carbonate (OS-CAL) 600 MG TABS Take 600 mg by mouth daily.   03/11/2024   Cholecalciferol (VITAMIN D) 2000 UNITS tablet Take 2,000 Units by mouth daily.   03/11/2024   fluticasone  furoate-vilanterol (BREO ELLIPTA ) 100-25 MCG/ACT AEPB Inhale 1 puff into the lungs daily. (Patient taking differently: Inhale 1 puff into the lungs daily as needed (for wheezing and shortness of breath).) 1 each 5 Past Week   ibuprofen (ADVIL) 200 MG tablet Take 200-400 mg by mouth daily as needed for moderate pain (pain score 4-6).   Past Week   levothyroxine  (SYNTHROID ) 100 MCG tablet Take 100 mcg by mouth daily before breakfast.   03/13/2024 Morning   metoprolol  tartrate (LOPRESSOR ) 25 MG tablet Take 25 mg by mouth daily.   03/13/2024 Morning   Multiple Vitamins-Minerals (MULTIVITAMIN WITH MINERALS) tablet Take 1 tablet by mouth daily.   03/11/2024   rosuvastatin  (CRESTOR ) 20 MG tablet  TAKE 1 TABLET BY MOUTH EVERY DAY (Patient taking differently: Take 40 mg by mouth daily.) 90 tablet 0 03/13/2024 Morning    Family History  Problem Relation Age of Onset   Heart disease Father    Heart attack Father    Heart failure Mother    CAD Brother      Review of Systems:   Review of Systems  Constitutional:  Positive for malaise/fatigue.  HENT: Negative.    Eyes: Negative.   Respiratory:  Positive for shortness of breath.   Cardiovascular:  Positive for chest pain and leg swelling.  Gastrointestinal:  Positive for diarrhea, heartburn and nausea.  Genitourinary: Negative.   Musculoskeletal:  Positive for back pain and joint pain.  Skin: Negative.   Neurological:  Positive for tingling and weakness.  Endo/Heme/Allergies: Negative.   Psychiatric/Behavioral:  Positive for memory loss. The patient has insomnia.                   Physical Exam: BP (!) 149/64   Pulse (!) 45   Temp 97.9 F (36.6 C)   Resp 16   Ht 4\' 10"  (1.473 m)   Wt 74.4 kg   SpO2 100%   BMI 34.28 kg/m    General appearance: alert, cooperative, and no distress Head: Normocephalic, without obvious abnormality, atraumatic Neck: no adenopathy, no carotid bruit, no JVD, supple, symmetrical, trachea midline, and thyroid  may be enlarged, no definite  nodules palpated Lymph nodes: Cervical, supraclavicular, and axillary nodes normal. Resp: clear to auscultation bilaterally Back: not examined .bedrest Cardio: regular rate and rhythm, S1, S2 normal, no murmur, click, rub or gallop GI: soft, non-tender; bowel sounds normal; no masses,  no organomegaly and obese Extremities: minor LLE edema,pedal pulses intact Neurologic: Grossly normal  Diagnostic Studies & Laboratory data:     Recent Radiology Findings:   CARDIAC CATHETERIZATION Result Date: 03/14/2024 Images from the original result were not included. Cardiac Catheterization 03/14/24: Hemodynamic data: LVEDP 19 mmHg, moderately elevated.  No  pressure gradient across the aortic valve. Angiographic data: LM: Moderately calcified coronary arteries.  Distal left main is at least 70% narrowed and extends into CX and proximal LAD. LAD: Moderate coronary calcification of proximal and mid LAD, ostial and proximal LAD 20 to 30% stenosed.  Large D1 with ostial 99% stenosis. Cx: Large-caliber vessel, large OM 3, ostial CX 50% stenosed and calcific. RCA: Dominant.  Mild disease in the proximal segment, PL branch has a proximal ostial 40% stenosis. Impression and recommendations: Significant left main disease, patient needs consultation for CABG.   ECHOCARDIOGRAM COMPLETE Result Date: 03/14/2024    ECHOCARDIOGRAM REPORT   Patient Name:   Linda Cardenas Date of Exam: 03/14/2024 Medical Rec #:  [id number]    Height:       58.0 in Accession #:    [id number]   Weight:       164.0 lb Date of Birth:  04/19/1956    BSA:          1.674 m Patient Age:    67 years     BP:           158/68 mmHg Patient Gender: F            HR:           47 bpm. Exam Location:  Inpatient Procedure: 2D Echo, Cardiac Doppler, Color Doppler and Intracardiac            Opacification Agent (Both Spectral and Color Flow Doppler were            utilized during procedure). Indications:    NSTEMI I21.4  History:        Patient has no prior history of Echocardiogram examinations.                 Signs/Symptoms:Shortness of Breath.  Sonographer:    Astrid Blamer Referring Phys: [id number] ROBIN FERNANDES IMPRESSIONS  1. Left ventricular ejection fraction, by estimation, is 60 to 65%. The left ventricle has normal function. The left ventricle has no regional wall motion abnormalities. Left ventricular diastolic parameters are indeterminate.  2. Right ventricular systolic function is normal. The right ventricular size is normal.  3. Left atrial size was moderately dilated.  4. The mitral valve is normal in structure. Trivial mitral valve regurgitation. No evidence of mitral stenosis.  5. The aortic valve is  tricuspid. There is mild calcification of the aortic valve. Aortic valve regurgitation is not visualized. No aortic stenosis is present.  6. The inferior vena cava is normal in size with greater than 50% respiratory variability, suggesting right atrial pressure of 3 mmHg. FINDINGS  Left Ventricle: Left ventricular ejection fraction, by estimation, is 60 to 65%. The left ventricle has normal function. The left ventricle has no regional wall motion abnormalities. Definity  contrast agent was given IV to delineate the left ventricular  endocardial borders. The left ventricular internal cavity size was normal in size. There is no  left ventricular hypertrophy. Left ventricular diastolic parameters are indeterminate. Right Ventricle: The right ventricular size is normal. No increase in right ventricular wall thickness. Right ventricular systolic function is normal. Left Atrium: Left atrial size was moderately dilated. Right Atrium: Right atrial size was normal in size. Pericardium: There is no evidence of pericardial effusion. Mitral Valve: The mitral valve is normal in structure. Mild mitral annular calcification. Trivial mitral valve regurgitation. No evidence of mitral valve stenosis. Tricuspid Valve: The tricuspid valve is normal in structure. Tricuspid valve regurgitation is trivial. No evidence of tricuspid stenosis. Aortic Valve: The aortic valve is tricuspid. There is mild calcification of the aortic valve. Aortic valve regurgitation is not visualized. No aortic stenosis is present. Aortic valve mean gradient measures 3.0 mmHg. Aortic valve peak gradient measures 6.5 mmHg. Aortic valve area, by VTI measures 2.29 cm. Pulmonic Valve: The pulmonic valve was normal in structure. Pulmonic valve regurgitation is trivial. No evidence of pulmonic stenosis. Aorta: The aortic root is normal in size and structure. Venous: The inferior vena cava is normal in size with greater than 50% respiratory variability, suggesting right  atrial pressure of 3 mmHg. IAS/Shunts: No atrial level shunt detected by color flow Doppler.  LEFT VENTRICLE PLAX 2D LVIDd:         4.10 cm   Diastology LVIDs:         2.80 cm   LV e' medial:    7.18 cm/s LV PW:         1.00 cm   LV E/e' medial:  13.9 LV IVS:        0.90 cm   LV e' lateral:   8.49 cm/s LVOT diam:     1.80 cm   LV E/e' lateral: 11.8 LV SV:         78 LV SV Index:   47 LVOT Area:     2.54 cm  RIGHT VENTRICLE RV S prime:     10.10 cm/s TAPSE (M-mode): 1.8 cm LEFT ATRIUM             Index        RIGHT ATRIUM          Index LA Vol (A2C):   80.0 ml 47.79 ml/m  RA Area:     9.64 cm LA Vol (A4C):   38.0 ml 22.70 ml/m  RA Volume:   18.20 ml 10.87 ml/m LA Biplane Vol: 59.4 ml 35.49 ml/m  AORTIC VALVE AV Area (Vmax):    2.08 cm AV Area (Vmean):   2.34 cm AV Area (VTI):     2.29 cm AV Vmax:           127.00 cm/s AV Vmean:          86.400 cm/s AV VTI:            0.340 m AV Peak Grad:      6.5 mmHg AV Mean Grad:      3.0 mmHg LVOT Vmax:         104.00 cm/s LVOT Vmean:        79.300 cm/s LVOT VTI:          0.306 m LVOT/AV VTI ratio: 0.90  AORTA Ao Root diam: 2.90 cm MITRAL VALVE               TRICUSPID VALVE MV Area (PHT): 3.66 cm    TR Peak grad:   11.7 mmHg MV Decel Time: 207 msec    TR Vmax:  171.00 cm/s MV E velocity: 99.90 cm/s MV A velocity: 92.20 cm/s  SHUNTS MV E/A ratio:  1.08        Systemic VTI:  0.31 m                            Systemic Diam: 1.80 cm Jules Oar MD Electronically signed by Jules Oar MD Signature Date/Time: 03/14/2024/1:20:25 PM    Final    DG Chest 2 View Result Date: 03/13/2024 CLINICAL DATA:  sob findings EXAM: CHEST - 2 VIEW COMPARISON:  February 10, 2020 FINDINGS: No focal airspace consolidation, pleural effusion, or pneumothorax. No cardiomegaly. Tortuous aorta with aortic atherosclerosis. No acute fracture or destructive lesions. Multilevel degenerative disc disease of the spine. Exaggerated thoracic kyphosis, unchanged. Cholecystectomy clips.  IMPRESSION: Kyphotic positioning. Otherwise, no acute cardiopulmonary abnormality. Electronically Signed   By: Rance Burrows M.D.   On: 03/13/2024 21:58     I have independently reviewed the above radiologic studies and discussed with the patient   Recent Lab Findings: Lab Results  Component Value Date   WBC 6.8 03/14/2024   HGB 12.4 03/14/2024   HCT 37.7 03/14/2024   PLT 183 03/14/2024   GLUCOSE 92 03/14/2024   CHOL 145 03/14/2024   TRIG 48 03/14/2024   HDL 68 03/14/2024   LDLCALC 67 03/14/2024   ALT 24 03/14/2024   AST 28 03/14/2024   NA 140 03/14/2024   K 3.8 03/14/2024   CL 105 03/14/2024   CREATININE 0.51 03/14/2024   BUN 8 03/14/2024   CO2 24 03/14/2024   TSH 0.953 03/14/2024   INR 1.1 03/14/2024   HGBA1C 6.0 (H) 03/14/2024      Assessment / Plan: 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  Severe diagonal lesion. Non-STEMI Sinus bradycardia Essential hypertension History of diastolic dysfunction Hyperlipidemia Hypothyroidism Arthritis Chronic low back pain Chronic knee pain Lactose intolerance  Plan: The patient appears to be a candidate for coronary artery surgical revascularization.  The surgeon will evaluate the patient and all relevant studies to make final determination and timing.     I  spent 30 minutes counseling the patient face to face.   Lindi Revering, PA-C  03/14/2024 2:06 PM    67yo female presents with NSTEMI.  LM disease, preserved EF and no significant valve disease.  Will plan for CABG 3.  Cameron Katayama Ala Alice

## 2024-03-15 ENCOUNTER — Inpatient Hospital Stay (HOSPITAL_COMMUNITY)

## 2024-03-15 ENCOUNTER — Encounter (HOSPITAL_COMMUNITY): Payer: Self-pay | Admitting: Cardiology

## 2024-03-15 DIAGNOSIS — R0789 Other chest pain: Secondary | ICD-10-CM | POA: Diagnosis not present

## 2024-03-15 DIAGNOSIS — Z01810 Encounter for preprocedural cardiovascular examination: Secondary | ICD-10-CM | POA: Diagnosis not present

## 2024-03-15 DIAGNOSIS — E785 Hyperlipidemia, unspecified: Secondary | ICD-10-CM

## 2024-03-15 DIAGNOSIS — I10 Essential (primary) hypertension: Secondary | ICD-10-CM | POA: Diagnosis not present

## 2024-03-15 DIAGNOSIS — R7989 Other specified abnormal findings of blood chemistry: Secondary | ICD-10-CM | POA: Diagnosis not present

## 2024-03-15 DIAGNOSIS — I2510 Atherosclerotic heart disease of native coronary artery without angina pectoris: Secondary | ICD-10-CM | POA: Diagnosis not present

## 2024-03-15 DIAGNOSIS — I214 Non-ST elevation (NSTEMI) myocardial infarction: Secondary | ICD-10-CM | POA: Diagnosis not present

## 2024-03-15 DIAGNOSIS — R001 Bradycardia, unspecified: Secondary | ICD-10-CM | POA: Diagnosis not present

## 2024-03-15 LAB — CBC
HCT: 35.1 % — ABNORMAL LOW (ref 36.0–46.0)
Hemoglobin: 11.5 g/dL — ABNORMAL LOW (ref 12.0–15.0)
MCH: 28.1 pg (ref 26.0–34.0)
MCHC: 32.8 g/dL (ref 30.0–36.0)
MCV: 85.8 fL (ref 80.0–100.0)
Platelets: 161 10*3/uL (ref 150–400)
RBC: 4.09 MIL/uL (ref 3.87–5.11)
RDW: 13.7 % (ref 11.5–15.5)
WBC: 5.6 10*3/uL (ref 4.0–10.5)
nRBC: 0 % (ref 0.0–0.2)

## 2024-03-15 LAB — BLOOD GAS, ARTERIAL
Acid-Base Excess: 4.2 mmol/L — ABNORMAL HIGH (ref 0.0–2.0)
Bicarbonate: 28.4 mmol/L — ABNORMAL HIGH (ref 20.0–28.0)
Drawn by: 44166
O2 Saturation: 98.2 %
Patient temperature: 36.7
pCO2 arterial: 39 mmHg (ref 32–48)
pH, Arterial: 7.47 — ABNORMAL HIGH (ref 7.35–7.45)
pO2, Arterial: 96 mmHg (ref 83–108)

## 2024-03-15 LAB — BASIC METABOLIC PANEL WITH GFR
Anion gap: 8 (ref 5–15)
BUN: 8 mg/dL (ref 8–23)
CO2: 27 mmol/L (ref 22–32)
Calcium: 9 mg/dL (ref 8.9–10.3)
Chloride: 103 mmol/L (ref 98–111)
Creatinine, Ser: 0.6 mg/dL (ref 0.44–1.00)
GFR, Estimated: >60 mL/min (ref >60)
Glucose, Bld: 94 mg/dL (ref 70–99)
Potassium: 3.7 mmol/L (ref 3.5–5.1)
Sodium: 138 mmol/L (ref 135–145)

## 2024-03-15 LAB — MAGNESIUM: Magnesium: 1.8 mg/dL (ref 1.7–2.4)

## 2024-03-15 LAB — SURGICAL PCR SCREEN
MRSA, PCR: NEGATIVE
Staphylococcus aureus: NEGATIVE

## 2024-03-15 LAB — ABO/RH: ABO/RH(D): O POS

## 2024-03-15 MED ORDER — IRBESARTAN 75 MG PO TABS
75.0000 mg | ORAL_TABLET | Freq: Every day | ORAL | Status: DC
  Administered 2024-03-15 – 2024-03-16 (×2): 75 mg via ORAL
  Filled 2024-03-15 (×2): qty 1

## 2024-03-15 NOTE — Progress Notes (Signed)
 Received patient in bed, spouse in room. Daughter who is RN came to room. Provided booklet on OHS. Reviewed the concept of "move-in-the-tube" for sternal precautions. Pt practiced bed mobility for after OHS. Introduced the I/S, encouraged 10 x/hr. Pt pulled 1000 mL. Discussed patient having family with her for 1 week after surgery; family will be making plans for this.Provided information to access the pre-op OHS video. Pt and spouse verbalized understanding of the education. C/R to follow after surgery.  Rosezena Contes MS, ACSM-CEP, CCRP   12:30-13:09

## 2024-03-15 NOTE — Plan of Care (Signed)
  Problem: Education: Goal: Understanding of CV disease, CV risk reduction, and recovery process will improve Outcome: Progressing Goal: Individualized Educational Video(s) Outcome: Progressing   Problem: Activity: Goal: Ability to return to baseline activity level will improve Outcome: Progressing   Problem: Cardiovascular: Goal: Ability to achieve and maintain adequate cardiovascular perfusion will improve Outcome: Progressing   

## 2024-03-15 NOTE — Progress Notes (Signed)
 PROGRESS NOTE  Linda Cardenas ZOX:[id number] DOB: Sep 11, 1956   PCP: Mordechai April, DO  Patient is from: Home.  DOA: 03/13/2024 LOS: 1  Chief complaints Chief Complaint  Patient presents with   chest discomfort     Brief Narrative / Interim history: 68 year old F with PMH of HTN, reactive airway disease, osteoarthritis, hypothyroidism, HFpEF and hyperlipidemia presenting with chest discomfort that she describes as indigestion, dizziness and left arm tingling, and admitted with working diagnosis of unstable angina/non-STEMI.   In ED, bradycardic in 40s to low 50s.  EKG without acute ST/T wave change.  High-sensitivity troponin 29 x 2.  CXR without acute finding.  D-dimer negative.  Cardiology consulted.  Patient was started on IV heparin   TTE without significant finding.  LHC showed 70% left main stenosis with 90% diagonal stenosis and 50% circumflex stenosis.  Cardiology recommended CABG.  CTS on board.  Subjective: Seen and examined earlier this morning.  No major events overnight of this morning.  No complaints.  No further chest pain.  Husband and daughter at bedside.  Objective: Vitals:   03/14/24 2324 03/15/24 0324 03/15/24 0600 03/15/24 0755  BP: (!) 123/52 (!) 107/59  (!) 134/57  Pulse: 61 (!) 50  (!) 57  Resp:    16  Temp:   98.5 F (36.9 C) 98 F (36.7 C)  TempSrc:   Oral Oral  SpO2: 95% 96%  100%  Weight:      Height:        Examination:  GENERAL: No apparent distress.  Nontoxic. HEENT: MMM.  Vision and hearing grossly intact.  NECK: Supple.  No apparent JVD.  RESP:  No IWOB.  Fair aeration bilaterally. CVS:  RRR. Heart sounds normal.  ABD/GI/GU: BS+. Abd soft, NTND.  MSK/EXT:   No apparent deformity. Moves extremities. No edema.  SKIN: no apparent skin lesion or wound NEURO: Awake and alert. Oriented appropriately.  No apparent focal neuro deficit. PSYCH: Calm. Normal affect.   Consultants:  Cardiology CTS  Procedures: None  Microbiology  summarized: None  Assessment and plan: Non-STEMI/CAD: Presents with chest discomfort, dizziness and left arm tingling.  Symptoms resolved.  High-sensitivity troponin 29 x 2.  EKG without acute ST/T wave changes.  TTE and LHC as above.  A1c 6.0%.  LDL 48.  -Cardiology recommended CABG. CTS following -On Avapro and low-dose aspirin . -Continue holding metoprolol  -Cardiology recommends low-dose Imdur on discharge if CTS likes to do CABG outpatient  Sinus bradycardia: Due to metoprolol ?  TSH and free T4 within normal.  Resolved. -Continue holding beta-blocker   Essential hypertension: BP improved. -Continue holding metoprolol  for bradycardia -Cardiology started Avapro and recommended adding low-dose Imdur on discharge  Chronic HFpEF: TTE and CXR without significant finding.  Not on diuretics at home.   Reactive airway disease -Continue Breo Ellipta  once daily and albuterol as needed   Hypothyroidism: TSH and free T4 within normal. -Continue home Synthroid   Prediabetes: A1c 6.0%. - Encourage lifestyle change   Class I obesity Body mass index is 34.28 kg/m. - Encourage lifestyle change to lose weight          DVT prophylaxis:  Place and maintain sequential compression device Start: 03/14/24 0524 SCDs Start: 03/14/24 0420 Place TED hose Start: 03/14/24 0420  Code Status: Full code Family Communication: Updated patient's daughter and husband at bedside Level of care: Telemetry Cardiac Status is: Inpatient Remains inpatient appropriate because: CAD.  Needs CABG.   Final disposition: Home   50 minutes with more than 50% spent in  reviewing records, counseling patient/family and coordinating care.   Sch Meds:  Scheduled Meds:  aspirin  EC  81 mg Oral Daily   fluticasone  furoate-vilanterol  1 puff Inhalation Daily   irbesartan  75 mg Oral Daily   levothyroxine   100 mcg Oral Q0600   rosuvastatin   20 mg Oral Daily   sodium chloride  flush  3 mL Intravenous Q12H    Continuous Infusions:  sodium chloride      PRN Meds:.sodium chloride , acetaminophen  **OR** acetaminophen , albuterol, alum & mag hydroxide-simeth, hydrALAZINE, sodium chloride  flush, sodium chloride  flush  Antimicrobials: Anti-infectives (From admission, onward)    None        I have personally reviewed the following labs and images: CBC: Recent Labs  Lab 03/13/24 2107 03/14/24 0420 03/15/24 0223  WBC 6.4 6.8 5.6  HGB 12.3 12.4 11.5*  HCT 37.5 37.7 35.1*  MCV 86.8 87.9 85.8  PLT 201 183 161   BMP &GFR Recent Labs  Lab 03/13/24 2107 03/14/24 0420 03/15/24 0223  NA 139 140 138  K 3.8 3.8 3.7  CL 106 105 103  CO2 24 24 27   GLUCOSE 104* 92 94  BUN 10 8 8   CREATININE 0.49 0.51 0.60  CALCIUM  9.5 9.4 9.0  MG  --   --  1.8   Estimated Creatinine Clearance: 58.5 mL/min (by C-G formula based on SCr of 0.6 mg/dL). Liver & Pancreas: Recent Labs  Lab 03/14/24 0420  AST 28  ALT 24  ALKPHOS 42  BILITOT 0.7  PROT 6.8  ALBUMIN 3.7   No results for input(s): "LIPASE", "AMYLASE" in the last 168 hours. No results for input(s): "AMMONIA" in the last 168 hours. Diabetic: Recent Labs    03/14/24 0443  HGBA1C 6.0*   No results for input(s): "GLUCAP" in the last 168 hours. Cardiac Enzymes: No results for input(s): "CKTOTAL", "CKMB", "CKMBINDEX", "TROPONINI" in the last 168 hours. No results for input(s): "PROBNP" in the last 8760 hours. Coagulation Profile: Recent Labs  Lab 03/14/24 0420  INR 1.1   Thyroid  Function Tests: Recent Labs    03/14/24 0420 03/14/24 0443  TSH  --  0.953  FREET4 1.09  --    Lipid Profile: Recent Labs    03/14/24 0443  CHOL 145  HDL 68  LDLCALC 67  TRIG 48  CHOLHDL 2.1   Anemia Panel: No results for input(s): "VITAMINB12", "FOLATE", "FERRITIN", "TIBC", "IRON", "RETICCTPCT" in the last 72 hours. Urine analysis: No results found for: "COLORURINE", "APPEARANCEUR", "LABSPEC", "PHURINE", "GLUCOSEU", "HGBUR", "BILIRUBINUR",  "KETONESUR", "PROTEINUR", "UROBILINOGEN", "NITRITE", "LEUKOCYTESUR" Sepsis Labs: Invalid input(s): "PROCALCITONIN", "LACTICIDVEN"  Microbiology: No results found for this or any previous visit (from the past 240 hours).  Radiology Studies: CARDIAC CATHETERIZATION Result Date: 03/14/2024 Images from the original result were not included. Cardiac Catheterization 03/14/24: Hemodynamic data: LVEDP 19 mmHg, moderately elevated.  No pressure gradient across the aortic valve. Angiographic data: LM: Moderately calcified coronary arteries.  Distal left main is at least 70% narrowed and extends into CX and proximal LAD. LAD: Moderate coronary calcification of proximal and mid LAD, ostial and proximal LAD 20 to 30% stenosed.  Large D1 with ostial 99% stenosis. Cx: Large-caliber vessel, large OM 3, ostial CX 50% stenosed and calcific. RCA: Dominant.  Mild disease in the proximal segment, PL branch has a proximal ostial 40% stenosis. Impression and recommendations: Significant left main disease, patient needs consultation for CABG.      Abrahm Mancia T. Kyheem [place]  If 7PM-7AM, please contact night-coverage [email or web address] 03/15/2024, 12:19 PM

## 2024-03-15 NOTE — Plan of Care (Signed)
  Problem: Activity: Goal: Ability to return to baseline activity level will improve Outcome: Progressing   

## 2024-03-15 NOTE — Progress Notes (Signed)
   Patient Name: Linda Cardenas Date of Encounter: 03/15/2024 [facility] Cardiologist: None   Interval Summary  .    Feeling well currently without complaint  Vital Signs .    Vitals:   03/14/24 2324 03/15/24 0324 03/15/24 0600 03/15/24 0755  BP: (!) 123/52 (!) 107/59  (!) 134/57  Pulse: 61 (!) 50  (!) 57  Resp:    16  Temp:   98.5 F (36.9 C) 98 F (36.7 C)  TempSrc:   Oral Oral  SpO2: 95% 96%  100%  Weight:      Height:        Intake/Output Summary (Last 24 hours) at 03/15/2024 0923 Last data filed at 03/14/2024 1818 Gross per 24 hour  Intake 161.97 ml  Output --  Net 161.97 ml      03/13/2024    9:01 PM 03/13/2024    7:43 PM 09/07/2022    3:51 PM  Last 3 Weights  Weight (lbs) 164 lb 0.4 oz 164 lb 167 lb 3.2 oz  Weight (kg) 74.4 kg 74.39 kg 75.841 kg      Telemetry/ECG    Sinus rhythm- Personally Reviewed  Physical Exam .   GEN: No acute distress.   Neck: No JVD Cardiac: RRR, no murmurs, rubs, or gallops.  Respiratory: Clear to auscultation bilaterally. GI: Soft, nontender, non-distended  MS: No edema  Assessment & Plan .     1.  Coronary artery disease: Has 70% left main stenosis with 90% diagonal stenosis and 50% circumflex stenosis.  Troponin is not elevated significantly.  Bama Hanselman plan for cardiac surgery evaluation for possible CABG.  2.  Hypertension: Is been elevated while in the hospital.  Clebert Wenger hold metoprolol  and irbesartan 75 mg.  This can be increased for effect.  3.  Hyperlipidemia: Continue Crestor  40 mg  4.  Bradycardia: Holding metoprolol   For questions or updates, please contact [facility] Please consult [email or web address] for contact info under        Signed, Bradyn Soward Cortland Ding, MD

## 2024-03-16 DIAGNOSIS — I214 Non-ST elevation (NSTEMI) myocardial infarction: Secondary | ICD-10-CM | POA: Diagnosis not present

## 2024-03-16 LAB — URINALYSIS, ROUTINE W REFLEX MICROSCOPIC
Bilirubin Urine: NEGATIVE
Glucose, UA: NEGATIVE mg/dL
Hgb urine dipstick: NEGATIVE
Ketones, ur: NEGATIVE mg/dL
Nitrite: NEGATIVE
Protein, ur: NEGATIVE mg/dL
Specific Gravity, Urine: 1.013 (ref 1.005–1.030)
pH: 5 (ref 5.0–8.0)

## 2024-03-16 LAB — PREPARE RBC (CROSSMATCH)

## 2024-03-16 LAB — SARS CORONAVIRUS 2 (TAT 6-24 HRS): SARS Coronavirus 2: NEGATIVE

## 2024-03-16 MED ORDER — BISACODYL 5 MG PO TBEC: 5.0000 mg | DELAYED_RELEASE_TABLET | Freq: Once | ORAL | Status: DC

## 2024-03-16 MED ORDER — CEFAZOLIN SODIUM-DEXTROSE 2-4 GM/100ML-% IV SOLN
2.0000 g | INTRAVENOUS | Status: DC
  Filled 2024-03-16: qty 100

## 2024-03-16 MED ORDER — NITROGLYCERIN IN D5W 200-5 MCG/ML-% IV SOLN
2.0000 ug/min | INTRAVENOUS | Status: DC
  Filled 2024-03-16: qty 250

## 2024-03-16 MED ORDER — MANNITOL 20 % IV SOLN
INTRAVENOUS | Status: DC
  Filled 2024-03-16: qty 13

## 2024-03-16 MED ORDER — CEFAZOLIN SODIUM-DEXTROSE 2-4 GM/100ML-% IV SOLN
2.0000 g | INTRAVENOUS | Status: AC
  Administered 2024-03-17: 2 g via INTRAVENOUS
  Filled 2024-03-16: qty 100

## 2024-03-16 MED ORDER — TRANEXAMIC ACID (OHS) PUMP PRIME SOLUTION
2.0000 mg/kg | INTRAVENOUS | Status: DC
  Filled 2024-03-16: qty 1.49

## 2024-03-16 MED ORDER — DEXMEDETOMIDINE HCL IN NACL 400 MCG/100ML IV SOLN
0.1000 ug/kg/h | INTRAVENOUS | Status: AC
  Administered 2024-03-17: .4 ug/kg/h via INTRAVENOUS
  Filled 2024-03-16: qty 100

## 2024-03-16 MED ORDER — TRANEXAMIC ACID (OHS) BOLUS VIA INFUSION
15.0000 mg/kg | INTRAVENOUS | Status: AC
  Administered 2024-03-17: 1116 mg via INTRAVENOUS
  Filled 2024-03-16: qty 1116

## 2024-03-16 MED ORDER — MILRINONE LACTATE IN DEXTROSE 20-5 MG/100ML-% IV SOLN
0.3000 ug/kg/min | INTRAVENOUS | Status: DC
  Filled 2024-03-16: qty 100

## 2024-03-16 MED ORDER — INSULIN REGULAR(HUMAN) IN NACL 100-0.9 UT/100ML-% IV SOLN
INTRAVENOUS | Status: AC
  Administered 2024-03-17: 1.8 [IU]/h via INTRAVENOUS
  Filled 2024-03-16: qty 100

## 2024-03-16 MED ORDER — PLASMA-LYTE A IV SOLN
INTRAVENOUS | Status: DC
  Filled 2024-03-16: qty 2.5

## 2024-03-16 MED ORDER — TRANEXAMIC ACID 1000 MG/10ML IV SOLN
1.5000 mg/kg/h | INTRAVENOUS | Status: AC
Start: 2024-03-17 — End: 2024-03-18
  Administered 2024-03-17: 1.5 mg/kg/h via INTRAVENOUS
  Filled 2024-03-16: qty 25

## 2024-03-16 MED ORDER — NOREPINEPHRINE 4 MG/250ML-% IV SOLN
0.0000 ug/min | INTRAVENOUS | Status: AC
  Administered 2024-03-17: 2 ug/min via INTRAVENOUS
  Filled 2024-03-16: qty 250

## 2024-03-16 MED ORDER — TEMAZEPAM 15 MG PO CAPS
15.0000 mg | ORAL_CAPSULE | Freq: Once | ORAL | Status: AC | PRN
  Administered 2024-03-16: 15 mg via ORAL
  Filled 2024-03-16: qty 1

## 2024-03-16 MED ORDER — CHLORHEXIDINE GLUCONATE CLOTH 2 % EX PADS
6.0000 | MEDICATED_PAD | Freq: Once | CUTANEOUS | Status: AC
  Administered 2024-03-16: 6 via TOPICAL

## 2024-03-16 MED ORDER — METOPROLOL TARTRATE 12.5 MG HALF TABLET
12.5000 mg | ORAL_TABLET | Freq: Once | ORAL | Status: AC
  Administered 2024-03-17: 12.5 mg via ORAL
  Filled 2024-03-16: qty 1

## 2024-03-16 MED ORDER — HEPARIN 30,000 UNITS/1000 ML (OHS) CELLSAVER SOLUTION
Status: DC
  Filled 2024-03-16: qty 1000

## 2024-03-16 MED ORDER — CHLORHEXIDINE GLUCONATE CLOTH 2 % EX PADS: 6.0000 | MEDICATED_PAD | Freq: Once | CUTANEOUS | Status: DC

## 2024-03-16 MED ORDER — CHLORHEXIDINE GLUCONATE 0.12 % MT SOLN
15.0000 mL | Freq: Once | OROMUCOSAL | Status: AC
  Administered 2024-03-17: 15 mL via OROMUCOSAL
  Filled 2024-03-16: qty 15

## 2024-03-16 MED ORDER — VANCOMYCIN HCL 1250 MG/250ML IV SOLN
1250.0000 mg | INTRAVENOUS | Status: AC
  Administered 2024-03-17: 1250 mg via INTRAVENOUS
  Filled 2024-03-16: qty 250

## 2024-03-16 MED ORDER — PHENYLEPHRINE HCL-NACL 20-0.9 MG/250ML-% IV SOLN
30.0000 ug/min | INTRAVENOUS | Status: DC
  Filled 2024-03-16: qty 250

## 2024-03-16 MED ORDER — POTASSIUM CHLORIDE 2 MEQ/ML IV SOLN
80.0000 meq | INTRAVENOUS | Status: DC
  Filled 2024-03-16: qty 40

## 2024-03-16 MED ORDER — EPINEPHRINE HCL 5 MG/250ML IV SOLN IN NS
0.0000 ug/min | INTRAVENOUS | Status: DC
  Filled 2024-03-16: qty 250

## 2024-03-16 NOTE — Progress Notes (Signed)
 RN notified by central telemetry that patients heart rate was 130s.  RN went to assess patient, patient ambulating in room from bathroom.  Patient encouraged to rest and allow heart rate to normalize. HR now in 90s. Dr Rudine Cos notified, stated he beta blocker was stopped due to bradycardia, no new orders received.

## 2024-03-16 NOTE — Plan of Care (Signed)
  Problem: Cardiovascular: Goal: Vascular access site(s) Level 0-1 will be maintained Outcome: Completed/Met

## 2024-03-16 NOTE — Progress Notes (Signed)
 PROGRESS NOTE    Linda Cardenas  WGN:[id number] DOB: 1955/12/27 DOA: 03/13/2024 PCP: Mordechai April, DO   Brief Narrative:  68 year old F with PMH of HTN, reactive airway disease, osteoarthritis, hypothyroidism, HFpEF and hyperlipidemia presenting with chest discomfort that she describes as indigestion, dizziness and left arm tingling, and admitted with working diagnosis of unstable angina/non-STEMI.  Patient found to be bradycardic without acute ST or T wave changes.  Troponin minimally elevated at 20s, chest x-ray unremarkable, D-dimer negative.  Hospitalist called for admission and cardiology called in consult.   Initiated on heparin  drip in the interim, TTE without significant findings, left heart cath as below with 70% left main, 90% diagonal and 50% circumferential stenosis currently recommending CABG.  Tentative plan for surgery in the next 24 to 48 hours pending clinical course and OR schedule.    Assessment & Plan:   Principal Problem:   NSTEMI (non-ST elevated myocardial infarction) [facility]) Active Problems:   Chest discomfort   Elevated troponin   Sinus bradycardia   Essential hypertension   Chronic diastolic CHF (congestive heart failure) (HCC)   Reactive airway disease   Hypothyroidism   Unstable angina (HCC)   Non-STEMI/CAD:  Troponin 29 x 2.   EKG without acute ST/T wave changes.   TTE unremarkable with multivessel disease as noted on [facility] recommended CABG. CTS following On Avapro and low-dose aspirin . Continue holding metoprolol    Acute symptomatic sinus bradycardia, improving - Patient and family report new increased dose of metoprolol  (currently on hold) -TSH/T4 within normal limits   Essential hypertension: Controlled. - Holding metoprolol  given bradycardia as above -Cardiology recommending irbesartan, consider adding low-dose Imdur at discharge    Chronic HFpEF: TTE and CXR without significant finding.  Not on diuretics at home. Reactive airway  disease - Continue Breo Ellipta  once daily and albuterol as needed Hypothyroidism: TSH and free T4 within normal limits, Continue home Synthroid  Prediabetes: A1c 6.0%. - Encourage lifestyle changes/dietary changes Class I obesity  Body mass index is 34.28 kg/m.  Discussed weight loss and dietary changes  DVT prophylaxis: Place and maintain sequential compression device Start: 03/14/24 0524 SCDs Start: 03/14/24 0420 Place TED hose Start: 03/14/24 0420 Code Status:   Code Status: Full Code Family Communication: Daughter at bedside  Status is: Inpatient  Dispo: The patient is from: Home              Anticipated d/c is to: To be determined              Anticipated d/c date is: 48 to 72 hours pending clinical course              Patient currently not medically stable for discharge, continues to require further evaluation by CT surgery for planned CABG, will likely need postoperative therapy evaluation  Consultants:  Cardiology, cardiothoracic surgery  Procedures:  Tentative plan CABG as above  Antimicrobials:  Perioperatively  Subjective: No acute issues or events overnight denies nausea vomiting diarrhea constipation any fevers chills or chest pain  Objective: Vitals:   03/15/24 1604 03/15/24 1704 03/15/24 2011 03/16/24 0554  BP: 128/68 132/62 (!) 140/80 (!) 146/76  Pulse:  61 67 67  Resp:  16 20 18   Temp:  98.6 F (37 C) 98 F (36.7 C) 97.6 F (36.4 C)  TempSrc:  Oral Oral Oral  SpO2:  99% 98% 98%  Weight:      Height:        Intake/Output Summary (Last 24 hours) at 03/16/2024 0740 Last data  filed at 03/16/2024 0555 Gross per 24 hour  Intake 6 ml  Output 400 ml  Net -394 ml   Filed Weights   03/13/24 2101  Weight: 74.4 kg    Examination:  General:  Pleasantly resting in bed, No acute distress. HEENT:  Normocephalic atraumatic.  Sclerae nonicteric, noninjected.  Extraocular movements intact bilaterally. Neck:  Without mass or deformity.  Trachea is  midline. Lungs:  Clear to auscultate bilaterally without rhonchi, wheeze, or rales. Heart: Bradycardic into the 50s.  Without murmurs, rubs, or gallops. Abdomen:  Soft, nontender, nondistended.  Without guarding or rebound. Extremities: Without cyanosis, clubbing, edema, or obvious deformity. Skin:  Warm and dry, no erythema.  Data Reviewed: I have personally reviewed following labs and imaging studies  CBC: Recent Labs  Lab 03/13/24 2107 03/14/24 0420 03/15/24 0223  WBC 6.4 6.8 5.6  HGB 12.3 12.4 11.5*  HCT 37.5 37.7 35.1*  MCV 86.8 87.9 85.8  PLT 201 183 161   Basic Metabolic Panel: Recent Labs  Lab 03/13/24 2107 03/14/24 0420 03/15/24 0223  NA 139 140 138  K 3.8 3.8 3.7  CL 106 105 103  CO2 24 24 27   GLUCOSE 104* 92 94  BUN 10 8 8   CREATININE 0.49 0.51 0.60  CALCIUM  9.5 9.4 9.0  MG  --   --  1.8   GFR: Estimated Creatinine Clearance: 58.5 mL/min (by C-G formula based on SCr of 0.6 mg/dL). Liver Function Tests: Recent Labs  Lab 03/14/24 0420  AST 28  ALT 24  ALKPHOS 42  BILITOT 0.7  PROT 6.8  ALBUMIN 3.7   Coagulation Profile: Recent Labs  Lab 03/14/24 0420  INR 1.1   HbA1C: Recent Labs    03/14/24 0443  HGBA1C 6.0*   Lipid Profile: Recent Labs    03/14/24 0443  CHOL 145  HDL 68  LDLCALC 67  TRIG 48  CHOLHDL 2.1   Thyroid  Function Tests: Recent Labs    03/14/24 0420 03/14/24 0443  TSH  --  0.953  FREET4 1.09  --    Recent Results (from the past 240 hours)  SARS CORONAVIRUS 2 (TAT 6-24 HRS) Anterior Nasal Swab     Status: None   Collection Time: 03/15/24  8:22 PM   Specimen: Anterior Nasal Swab  Result Value Ref Range Status   SARS Coronavirus 2 NEGATIVE NEGATIVE Final    Comment: (NOTE) SARS-CoV-2 target nucleic acids are NOT DETECTED.  The SARS-CoV-2 RNA is generally detectable in upper and lower respiratory specimens during the acute phase of infection. Negative results do not preclude SARS-CoV-2 infection, do not rule  out co-infections with other pathogens, and should not be used as the sole basis for treatment or other patient management decisions. Negative results must be combined with clinical observations, patient history, and epidemiological information. The expected result is Negative.  Fact Sheet for Patients: HairSlick.no  Fact Sheet for Healthcare Providers: quierodirigir.com  This test is not yet approved or cleared by the [place]  FDA and  has been authorized for detection and/or diagnosis of SARS-CoV-2 by FDA under an Emergency Use Authorization (EUA). This EUA will remain  in effect (meaning this test can be used) for the duration of the COVID-19 declaration under Se ction 564(b)(1) of the Act, 21 U.S.C. section 360bbb-3(b)(1), unless the authorization is terminated or revoked sooner.  Performed at [facility], [address] [place], [place] [address]   Surgical pcr screen     Status: None   Collection Time:  03/15/24  8:25 PM   Specimen: Anterior Nasal Swab  Result Value Ref Range Status   MRSA, PCR NEGATIVE NEGATIVE Final   Staphylococcus aureus NEGATIVE NEGATIVE Final    Comment: (NOTE) The Xpert SA Assay (FDA approved for NASAL specimens in patients 69 years of age and older), is one component of a comprehensive surveillance program. It is not intended to diagnose infection nor to guide or monitor treatment. Performed at [facility], [address], [place], [place] [address]          Radiology Studies: VAS US  DOPPLER PRE CABG Result Date: 03/15/2024 PREOPERATIVE VASCULAR EVALUATION Patient Name:  DELANO ZALAZAR  Date of Exam:   03/15/2024 Medical Rec #: [id number]     Accession #:    [id number] Date of Birth: 1956/11/15     Patient Gender: F Patient Age:   34 years Exam Location:  [facility] Procedure:      VAS US  DOPPLER PRE CABG Referring Phys: HARRELL LIGHTFOOT  --------------------------------------------------------------------------------  Indications:      Pre-CABG. Risk Factors:     Hypertension, hyperlipidemia, no history of smoking, prior MI. Other Factors:    CHF. Comparison Study: No previous exams Performing Technologist: Hill, Jody RVT, RDMS  Examination Guidelines: A complete evaluation includes B-mode imaging, spectral Doppler, color Doppler, and power Doppler as needed of all accessible portions of each vessel. Bilateral testing is considered an integral part of a complete examination. Limited examinations for reoccurring indications may be performed as noted.  Right Carotid Findings: +----------+--------+--------+--------+--------+------------------+           PSV cm/sEDV cm/sStenosisDescribeComments           +----------+--------+--------+--------+--------+------------------+ CCA Prox  97      17                                         +----------+--------+--------+--------+--------+------------------+ CCA Distal98      18                                         +----------+--------+--------+--------+--------+------------------+ ICA Prox  47      16                      intimal thickening +----------+--------+--------+--------+--------+------------------+ ICA Distal85      27                                         +----------+--------+--------+--------+--------+------------------+ ECA       126     10                                         +----------+--------+--------+--------+--------+------------------+ +----------+--------+-------+----------------+------------+           PSV cm/sEDV cmsDescribe        Arm Pressure +----------+--------+-------+----------------+------------+ Subclavian171            Multiphasic, WNL             +----------+--------+-------+----------------+------------+ +---------+--------+--+--------+--+---------+ VertebralPSV cm/s53EDV cm/s17Antegrade  +---------+--------+--+--------+--+---------+ Left Carotid Findings: +----------+--------+--------+--------+--------+------------------+           PSV cm/sEDV cm/sStenosisDescribeComments           +----------+--------+--------+--------+--------+------------------+  CCA Prox  88      11                                         +----------+--------+--------+--------+--------+------------------+ CCA Distal108     18                      intimal thickening +----------+--------+--------+--------+--------+------------------+ ICA Prox  60      20                                         +----------+--------+--------+--------+--------+------------------+ ICA Distal76      29                                         +----------+--------+--------+--------+--------+------------------+ ECA       119     9                                          +----------+--------+--------+--------+--------+------------------+ +----------+--------+--------+----------------+------------+ SubclavianPSV cm/sEDV cm/sDescribe        Arm Pressure +----------+--------+--------+----------------+------------+           135             Multiphasic, WNL             +----------+--------+--------+----------------+------------+ +---------+--------+--+--------+--+---------+ VertebralPSV cm/s57EDV cm/s14Antegrade +---------+--------+--+--------+--+---------+  ABI Findings: +------------------+-----+---------+ Rt Pressure (mmHg)IndexWaveform  +------------------+-----+---------+ 130                    triphasic +------------------+-----+---------+ 148               1.06 biphasic  +------------------+-----+---------+ 138               0.99 biphasic  +------------------+-----+---------+ 82                0.59 Normal    +------------------+-----+---------+ +------------------+-----+---------+ Lt Pressure (mmHg)IndexWaveform  +------------------+-----+---------+ 140                     triphasic +------------------+-----+---------+ 148               1.06 biphasic  +------------------+-----+---------+ 150               1.07 biphasic  +------------------+-----+---------+ 91                0.65 Normal    +------------------+-----+---------+  Right Doppler Findings: +--------+--------+---------+ Site    PressureDoppler   +--------+--------+---------+ Brachial130     triphasic +--------+--------+---------+ Radial          triphasic +--------+--------+---------+ Ulnar           triphasic +--------+--------+---------+  Left Doppler Findings: +--------+--------+---------+ Site    PressureDoppler   +--------+--------+---------+ [id number]     triphasic +--------+--------+---------+ Radial          triphasic +--------+--------+---------+ Ulnar           triphasic +--------+--------+---------+   Summary: Right Carotid: The extracranial vessels were near-normal with only minimal wall                thickening or plaque. Left Carotid: The extracranial  vessels were near-normal with only minimal wall               thickening or plaque. Vertebrals:  Bilateral vertebral arteries demonstrate antegrade flow. Subclavians: Normal flow hemodynamics were seen in bilateral subclavian              arteries. Right ABI: Resting right ankle-brachial index is within normal range. The right toe-brachial index is abnormal. Left ABI: Resting left ankle-brachial index is within normal range. The left toe-brachial index is normal. Bilateral Extremity: Doppler waveforms remain within normal limits with compression bilaterally for the radial arteries. Doppler waveforms remain within normal limits with compression bilaterally for the ulnar arteries.  Electronically signed by Jimmye Moulds MD on 03/15/2024 at 4:46:14 PM.    Final    CARDIAC CATHETERIZATION Result Date: 03/14/2024 Images from the original result were not included. Cardiac Catheterization 03/14/24: Hemodynamic  data: LVEDP 19 mmHg, moderately elevated.  No pressure gradient across the aortic valve. Angiographic data: LM: Moderately calcified coronary arteries.  Distal left main is at least 70% narrowed and extends into CX and proximal LAD. LAD: Moderate coronary calcification of proximal and mid LAD, ostial and proximal LAD 20 to 30% stenosed.  Large D1 with ostial 99% stenosis. Cx: Large-caliber vessel, large OM 3, ostial CX 50% stenosed and calcific. RCA: Dominant.  Mild disease in the proximal segment, PL branch has a proximal ostial 40% stenosis. Impression and recommendations: Significant left main disease, patient needs consultation for CABG.   ECHOCARDIOGRAM COMPLETE Result Date: 03/14/2024    ECHOCARDIOGRAM REPORT   Patient Name:   YARISBEL BOATENG Date of Exam: 03/14/2024 Medical Rec #:  [id number]    Height:       58.0 in Accession #:    [id number]   Weight:       164.0 lb Date of Birth:  14-Jan-1956    BSA:          1.674 m Patient Age:    67 years     BP:           158/68 mmHg Patient Gender: F            HR:           47 bpm. Exam Location:  Inpatient Procedure: 2D Echo, Cardiac Doppler, Color Doppler and Intracardiac            Opacification Agent (Both Spectral and Color Flow Doppler were            utilized during procedure). Indications:    NSTEMI I21.4  History:        Patient has no prior history of Echocardiogram examinations.                 Signs/Symptoms:Shortness of Breath.  Sonographer:    Astrid Blamer Referring Phys: [id number] ROBIN FERNANDES IMPRESSIONS  1. Left ventricular ejection fraction, by estimation, is 60 to 65%. The left ventricle has normal function. The left ventricle has no regional wall motion abnormalities. Left ventricular diastolic parameters are indeterminate.  2. Right ventricular systolic function is normal. The right ventricular size is normal.  3. Left atrial size was moderately dilated.  4. The mitral valve is normal in structure. Trivial mitral valve regurgitation. No evidence of  mitral stenosis.  5. The aortic valve is tricuspid. There is mild calcification of the aortic valve. Aortic valve regurgitation is not visualized. No aortic stenosis is present.  6. The inferior vena cava is normal in size with greater than 50% respiratory  variability, suggesting right atrial pressure of 3 mmHg. FINDINGS  Left Ventricle: Left ventricular ejection fraction, by estimation, is 60 to 65%. The left ventricle has normal function. The left ventricle has no regional wall motion abnormalities. Definity  contrast agent was given IV to delineate the left ventricular  endocardial borders. The left ventricular internal cavity size was normal in size. There is no left ventricular hypertrophy. Left ventricular diastolic parameters are indeterminate. Right Ventricle: The right ventricular size is normal. No increase in right ventricular wall thickness. Right ventricular systolic function is normal. Left Atrium: Left atrial size was moderately dilated. Right Atrium: Right atrial size was normal in size. Pericardium: There is no evidence of pericardial effusion. Mitral Valve: The mitral valve is normal in structure. Mild mitral annular calcification. Trivial mitral valve regurgitation. No evidence of mitral valve stenosis. Tricuspid Valve: The tricuspid valve is normal in structure. Tricuspid valve regurgitation is trivial. No evidence of tricuspid stenosis. Aortic Valve: The aortic valve is tricuspid. There is mild calcification of the aortic valve. Aortic valve regurgitation is not visualized. No aortic stenosis is present. Aortic valve mean gradient measures 3.0 mmHg. Aortic valve peak gradient measures 6.5 mmHg. Aortic valve area, by VTI measures 2.29 cm. Pulmonic Valve: The pulmonic valve was normal in structure. Pulmonic valve regurgitation is trivial. No evidence of pulmonic stenosis. Aorta: The aortic root is normal in size and structure. Venous: The inferior vena cava is normal in size with greater than 50%  respiratory variability, suggesting right atrial pressure of 3 mmHg. IAS/Shunts: No atrial level shunt detected by color flow Doppler.  LEFT VENTRICLE PLAX 2D LVIDd:         4.10 cm   Diastology LVIDs:         2.80 cm   LV e' medial:    7.18 cm/s LV PW:         1.00 cm   LV E/e' medial:  13.9 LV IVS:        0.90 cm   LV e' lateral:   8.49 cm/s LVOT diam:     1.80 cm   LV E/e' lateral: 11.8 LV SV:         78 LV SV Index:   47 LVOT Area:     2.54 cm  RIGHT VENTRICLE RV S prime:     10.10 cm/s TAPSE (M-mode): 1.8 cm LEFT ATRIUM             Index        RIGHT ATRIUM          Index LA Vol (A2C):   80.0 ml 47.79 ml/m  RA Area:     9.64 cm LA Vol (A4C):   38.0 ml 22.70 ml/m  RA Volume:   18.20 ml 10.87 ml/m LA Biplane Vol: 59.4 ml 35.49 ml/m  AORTIC VALVE AV Area (Vmax):    2.08 cm AV Area (Vmean):   2.34 cm AV Area (VTI):     2.29 cm AV Vmax:           127.00 cm/s AV Vmean:          86.400 cm/s AV VTI:            0.340 m AV Peak Grad:      6.5 mmHg AV Mean Grad:      3.0 mmHg LVOT Vmax:         104.00 cm/s LVOT Vmean:        79.300 cm/s LVOT VTI:  0.306 m LVOT/AV VTI ratio: 0.90  AORTA Ao Root diam: 2.90 cm MITRAL VALVE               TRICUSPID VALVE MV Area (PHT): 3.66 cm    TR Peak grad:   11.7 mmHg MV Decel Time: 207 msec    TR Vmax:        171.00 cm/s MV E velocity: 99.90 cm/s MV A velocity: 92.20 cm/s  SHUNTS MV E/A ratio:  1.08        Systemic VTI:  0.31 m                            Systemic Diam: 1.80 cm Jules Oar MD Electronically signed by Jules Oar MD Signature Date/Time: 03/14/2024/1:20:25 PM    Final         Scheduled Meds:  aspirin  EC  81 mg Oral Daily   fluticasone  furoate-vilanterol  1 puff Inhalation Daily   irbesartan  75 mg Oral Daily   levothyroxine   100 mcg Oral Q0600   rosuvastatin   20 mg Oral Daily   sodium chloride  flush  3 mL Intravenous Q12H   Continuous Infusions:   LOS: 2 days   Time spent:  Haydee Lipa, DO [facility]  If 7PM-7AM, please contact night-coverage [email or web address]  03/16/2024, 7:40 AM

## 2024-03-17 ENCOUNTER — Inpatient Hospital Stay (HOSPITAL_COMMUNITY)

## 2024-03-17 ENCOUNTER — Inpatient Hospital Stay (HOSPITAL_COMMUNITY): Payer: Self-pay | Admitting: Anesthesiology

## 2024-03-17 ENCOUNTER — Encounter (HOSPITAL_COMMUNITY): Payer: Self-pay | Admitting: Internal Medicine

## 2024-03-17 ENCOUNTER — Other Ambulatory Visit: Payer: Self-pay

## 2024-03-17 ENCOUNTER — Encounter (HOSPITAL_COMMUNITY)
Admission: EM | Disposition: A | Discharge status: Home Health | Payer: Self-pay | Source: Home / Self Care | Attending: Thoracic Surgery (Cardiothoracic Vascular Surgery)

## 2024-03-17 DIAGNOSIS — R739 Hyperglycemia, unspecified: Secondary | ICD-10-CM | POA: Diagnosis not present

## 2024-03-17 DIAGNOSIS — Z951 Presence of aortocoronary bypass graft: Secondary | ICD-10-CM

## 2024-03-17 DIAGNOSIS — I25110 Atherosclerotic heart disease of native coronary artery with unstable angina pectoris: Secondary | ICD-10-CM

## 2024-03-17 DIAGNOSIS — I214 Non-ST elevation (NSTEMI) myocardial infarction: Secondary | ICD-10-CM | POA: Diagnosis not present

## 2024-03-17 DIAGNOSIS — K219 Gastro-esophageal reflux disease without esophagitis: Secondary | ICD-10-CM | POA: Diagnosis not present

## 2024-03-17 HISTORY — PX: CORONARY ARTERY BYPASS GRAFT: SHX141

## 2024-03-17 HISTORY — PX: INTRAOPERATIVE TRANSESOPHAGEAL ECHOCARDIOGRAM: SHX5062

## 2024-03-17 LAB — POCT I-STAT EG7
Acid-Base Excess: 0 mmol/L (ref 0.0–2.0)
Bicarbonate: 24.6 mmol/L (ref 20.0–28.0)
Calcium, Ion: 0.99 mmol/L — ABNORMAL LOW (ref 1.15–1.40)
HCT: 20 % — ABNORMAL LOW (ref 36.0–46.0)
Hemoglobin: 6.8 g/dL — CL (ref 12.0–15.0)
O2 Saturation: 78 %
Potassium: 6.7 mmol/L (ref 3.5–5.1)
Sodium: 137 mmol/L (ref 135–145)
TCO2: 26 mmol/L (ref 22–32)
pCO2, Ven: 36 mmHg — ABNORMAL LOW (ref 44–60)
pH, Ven: 7.444 — ABNORMAL HIGH (ref 7.25–7.43)
pO2, Ven: 41 mmHg (ref 32–45)

## 2024-03-17 LAB — POCT I-STAT, CHEM 8
BUN: 6 mg/dL — ABNORMAL LOW (ref 8–23)
BUN: 5 mg/dL — ABNORMAL LOW (ref 8–23)
BUN: 5 mg/dL — ABNORMAL LOW (ref 8–23)
BUN: 5 mg/dL — ABNORMAL LOW (ref 8–23)
BUN: 5 mg/dL — ABNORMAL LOW (ref 8–23)
Calcium, Ion: 1.23 mmol/L (ref 1.15–1.40)
Calcium, Ion: 1.21 mmol/L (ref 1.15–1.40)
Calcium, Ion: 1.09 mmol/L — ABNORMAL LOW (ref 1.15–1.40)
Calcium, Ion: 1.07 mmol/L — ABNORMAL LOW (ref 1.15–1.40)
Calcium, Ion: 1.34 mmol/L (ref 1.15–1.40)
Chloride: 104 mmol/L (ref 98–111)
Chloride: 104 mmol/L (ref 98–111)
Chloride: 102 mmol/L (ref 98–111)
Chloride: 102 mmol/L (ref 98–111)
Chloride: 105 mmol/L (ref 98–111)
Creatinine, Ser: 0.4 mg/dL — ABNORMAL LOW (ref 0.44–1.00)
Creatinine, Ser: 0.4 mg/dL — ABNORMAL LOW (ref 0.44–1.00)
Creatinine, Ser: 0.4 mg/dL — ABNORMAL LOW (ref 0.44–1.00)
Creatinine, Ser: 0.3 mg/dL — ABNORMAL LOW (ref 0.44–1.00)
Creatinine, Ser: 0.4 mg/dL — ABNORMAL LOW (ref 0.44–1.00)
Glucose, Bld: 94 mg/dL (ref 70–99)
Glucose, Bld: 152 mg/dL — ABNORMAL HIGH (ref 70–99)
Glucose, Bld: 158 mg/dL — ABNORMAL HIGH (ref 70–99)
Glucose, Bld: 140 mg/dL — ABNORMAL HIGH (ref 70–99)
Glucose, Bld: 152 mg/dL — ABNORMAL HIGH (ref 70–99)
HCT: 31 % — ABNORMAL LOW (ref 36.0–46.0)
HCT: 29 % — ABNORMAL LOW (ref 36.0–46.0)
HCT: 21 % — ABNORMAL LOW (ref 36.0–46.0)
HCT: 20 % — ABNORMAL LOW (ref 36.0–46.0)
HCT: 21 % — ABNORMAL LOW (ref 36.0–46.0)
Hemoglobin: 10.5 g/dL — ABNORMAL LOW (ref 12.0–15.0)
Hemoglobin: 9.9 g/dL — ABNORMAL LOW (ref 12.0–15.0)
Hemoglobin: 7.1 g/dL — ABNORMAL LOW (ref 12.0–15.0)
Hemoglobin: 6.8 g/dL — CL (ref 12.0–15.0)
Hemoglobin: 7.1 g/dL — ABNORMAL LOW (ref 12.0–15.0)
Potassium: 3.6 mmol/L (ref 3.5–5.1)
Potassium: 3.6 mmol/L (ref 3.5–5.1)
Potassium: 4 mmol/L (ref 3.5–5.1)
Potassium: 3.8 mmol/L (ref 3.5–5.1)
Potassium: 4.3 mmol/L (ref 3.5–5.1)
Sodium: 139 mmol/L (ref 135–145)
Sodium: 139 mmol/L (ref 135–145)
Sodium: 137 mmol/L (ref 135–145)
Sodium: 138 mmol/L (ref 135–145)
Sodium: 139 mmol/L (ref 135–145)
TCO2: 26 mmol/L (ref 22–32)
TCO2: 26 mmol/L (ref 22–32)
TCO2: 28 mmol/L (ref 22–32)
TCO2: 24 mmol/L (ref 22–32)
TCO2: 28 mmol/L (ref 22–32)

## 2024-03-17 LAB — PREPARE RBC (CROSSMATCH)

## 2024-03-17 LAB — POCT I-STAT 7, (LYTES, BLD GAS, ICA,H+H)
Acid-Base Excess: 1 mmol/L (ref 0.0–2.0)
Acid-Base Excess: 0 mmol/L (ref 0.0–2.0)
Acid-Base Excess: 2 mmol/L (ref 0.0–2.0)
Acid-Base Excess: 0 mmol/L (ref 0.0–2.0)
Acid-base deficit: 1 mmol/L (ref 0.0–2.0)
Acid-base deficit: 3 mmol/L — ABNORMAL HIGH (ref 0.0–2.0)
Acid-base deficit: 1 mmol/L (ref 0.0–2.0)
Bicarbonate: 24.9 mmol/L (ref 20.0–28.0)
Bicarbonate: 23.6 mmol/L (ref 20.0–28.0)
Bicarbonate: 23.1 mmol/L (ref 20.0–28.0)
Bicarbonate: 25.7 mmol/L (ref 20.0–28.0)
Bicarbonate: 22.4 mmol/L (ref 20.0–28.0)
Bicarbonate: 24.2 mmol/L (ref 20.0–28.0)
Bicarbonate: 25.2 mmol/L (ref 20.0–28.0)
Calcium, Ion: 1.25 mmol/L (ref 1.15–1.40)
Calcium, Ion: 0.94 mmol/L — ABNORMAL LOW (ref 1.15–1.40)
Calcium, Ion: 1.07 mmol/L — ABNORMAL LOW (ref 1.15–1.40)
Calcium, Ion: 1.37 mmol/L (ref 1.15–1.40)
Calcium, Ion: 1.32 mmol/L (ref 1.15–1.40)
Calcium, Ion: 1.24 mmol/L (ref 1.15–1.40)
Calcium, Ion: 1.22 mmol/L (ref 1.15–1.40)
HCT: 33 % — ABNORMAL LOW (ref 36.0–46.0)
HCT: 20 % — ABNORMAL LOW (ref 36.0–46.0)
HCT: 21 % — ABNORMAL LOW (ref 36.0–46.0)
HCT: 22 % — ABNORMAL LOW (ref 36.0–46.0)
HCT: 20 % — ABNORMAL LOW (ref 36.0–46.0)
HCT: 22 % — ABNORMAL LOW (ref 36.0–46.0)
HCT: 22 % — ABNORMAL LOW (ref 36.0–46.0)
Hemoglobin: 11.2 g/dL — ABNORMAL LOW (ref 12.0–15.0)
Hemoglobin: 6.8 g/dL — CL (ref 12.0–15.0)
Hemoglobin: 7.1 g/dL — ABNORMAL LOW (ref 12.0–15.0)
Hemoglobin: 7.5 g/dL — ABNORMAL LOW (ref 12.0–15.0)
Hemoglobin: 6.8 g/dL — CL (ref 12.0–15.0)
Hemoglobin: 7.5 g/dL — ABNORMAL LOW (ref 12.0–15.0)
Hemoglobin: 7.5 g/dL — ABNORMAL LOW (ref 12.0–15.0)
O2 Saturation: 100 %
O2 Saturation: 100 %
O2 Saturation: 100 %
O2 Saturation: 100 %
O2 Saturation: 99 %
O2 Saturation: 99 %
O2 Saturation: 99 %
Patient temperature: 36
Patient temperature: 37.4
Potassium: 3.6 mmol/L (ref 3.5–5.1)
Potassium: 3.8 mmol/L (ref 3.5–5.1)
Potassium: 3.8 mmol/L (ref 3.5–5.1)
Potassium: 4.3 mmol/L (ref 3.5–5.1)
Potassium: 3.3 mmol/L — ABNORMAL LOW (ref 3.5–5.1)
Potassium: 4.1 mmol/L (ref 3.5–5.1)
Potassium: 4.2 mmol/L (ref 3.5–5.1)
Sodium: 140 mmol/L (ref 135–145)
Sodium: 138 mmol/L (ref 135–145)
Sodium: 138 mmol/L (ref 135–145)
Sodium: 140 mmol/L (ref 135–145)
Sodium: 142 mmol/L (ref 135–145)
Sodium: 140 mmol/L (ref 135–145)
Sodium: 140 mmol/L (ref 135–145)
TCO2: 26 mmol/L (ref 22–32)
TCO2: 25 mmol/L (ref 22–32)
TCO2: 24 mmol/L (ref 22–32)
TCO2: 27 mmol/L (ref 22–32)
TCO2: 24 mmol/L (ref 22–32)
TCO2: 25 mmol/L (ref 22–32)
TCO2: 26 mmol/L (ref 22–32)
pCO2 arterial: 35.2 mmHg (ref 32–48)
pCO2 arterial: 34.1 mmHg (ref 32–48)
pCO2 arterial: 32.3 mmHg (ref 32–48)
pCO2 arterial: 36 mmHg (ref 32–48)
pCO2 arterial: 42.2 mmHg (ref 32–48)
pCO2 arterial: 40 mmHg (ref 32–48)
pCO2 arterial: 45.2 mmHg (ref 32–48)
pH, Arterial: 7.459 — ABNORMAL HIGH (ref 7.35–7.45)
pH, Arterial: 7.449 (ref 7.35–7.45)
pH, Arterial: 7.461 — ABNORMAL HIGH (ref 7.35–7.45)
pH, Arterial: 7.463 — ABNORMAL HIGH (ref 7.35–7.45)
pH, Arterial: 7.328 — ABNORMAL LOW (ref 7.35–7.45)
pH, Arterial: 7.39 (ref 7.35–7.45)
pH, Arterial: 7.356 (ref 7.35–7.45)
pO2, Arterial: 241 mmHg — ABNORMAL HIGH (ref 83–108)
pO2, Arterial: 447 mmHg — ABNORMAL HIGH (ref 83–108)
pO2, Arterial: 173 mmHg — ABNORMAL HIGH (ref 83–108)
pO2, Arterial: 393 mmHg — ABNORMAL HIGH (ref 83–108)
pO2, Arterial: 139 mmHg — ABNORMAL HIGH (ref 83–108)
pO2, Arterial: 119 mmHg — ABNORMAL HIGH (ref 83–108)
pO2, Arterial: 145 mmHg — ABNORMAL HIGH (ref 83–108)

## 2024-03-17 LAB — GLUCOSE, CAPILLARY
Glucose-Capillary: 132 mg/dL — ABNORMAL HIGH (ref 70–99)
Glucose-Capillary: 105 mg/dL — ABNORMAL HIGH (ref 70–99)
Glucose-Capillary: 137 mg/dL — ABNORMAL HIGH (ref 70–99)
Glucose-Capillary: 151 mg/dL — ABNORMAL HIGH (ref 70–99)
Glucose-Capillary: 134 mg/dL — ABNORMAL HIGH (ref 70–99)
Glucose-Capillary: 137 mg/dL — ABNORMAL HIGH (ref 70–99)
Glucose-Capillary: 148 mg/dL — ABNORMAL HIGH (ref 70–99)
Glucose-Capillary: 168 mg/dL — ABNORMAL HIGH (ref 70–99)

## 2024-03-17 LAB — BASIC METABOLIC PANEL WITH GFR
Anion gap: 10 (ref 5–15)
Anion gap: 8 (ref 5–15)
BUN: 7 mg/dL — ABNORMAL LOW (ref 8–23)
BUN: 5 mg/dL — ABNORMAL LOW (ref 8–23)
CO2: 25 mmol/L (ref 22–32)
CO2: 26 mmol/L (ref 22–32)
Calcium: 9.8 mg/dL (ref 8.9–10.3)
Calcium: 8.7 mg/dL — ABNORMAL LOW (ref 8.9–10.3)
Chloride: 103 mmol/L (ref 98–111)
Chloride: 106 mmol/L (ref 98–111)
Creatinine, Ser: 0.48 mg/dL (ref 0.44–1.00)
Creatinine, Ser: 0.55 mg/dL (ref 0.44–1.00)
GFR, Estimated: >60 mL/min (ref >60)
GFR, Estimated: >60 mL/min (ref >60)
Glucose, Bld: 98 mg/dL (ref 70–99)
Glucose, Bld: 132 mg/dL — ABNORMAL HIGH (ref 70–99)
Potassium: 3.8 mmol/L (ref 3.5–5.1)
Potassium: 4.4 mmol/L (ref 3.5–5.1)
Sodium: 138 mmol/L (ref 135–145)
Sodium: 140 mmol/L (ref 135–145)

## 2024-03-17 LAB — CBC
HCT: 37.7 % (ref 36.0–46.0)
HCT: 20.7 % — ABNORMAL LOW (ref 36.0–46.0)
HCT: 25.2 % — ABNORMAL LOW (ref 36.0–46.0)
Hemoglobin: 12.5 g/dL (ref 12.0–15.0)
Hemoglobin: 6.8 g/dL — CL (ref 12.0–15.0)
Hemoglobin: 8.6 g/dL — ABNORMAL LOW (ref 12.0–15.0)
MCH: 28.3 pg (ref 26.0–34.0)
MCH: 28.6 pg (ref 26.0–34.0)
MCH: 29.7 pg (ref 26.0–34.0)
MCHC: 33.2 g/dL (ref 30.0–36.0)
MCHC: 32.9 g/dL (ref 30.0–36.0)
MCHC: 34.1 g/dL (ref 30.0–36.0)
MCV: 85.5 fL (ref 80.0–100.0)
MCV: 87 fL (ref 80.0–100.0)
MCV: 86.9 fL (ref 80.0–100.0)
Platelets: 185 10*3/uL (ref 150–400)
Platelets: 80 10*3/uL — ABNORMAL LOW (ref 150–400)
Platelets: 98 10*3/uL — ABNORMAL LOW (ref 150–400)
RBC: 4.41 MIL/uL (ref 3.87–5.11)
RBC: 2.38 MIL/uL — ABNORMAL LOW (ref 3.87–5.11)
RBC: 2.9 MIL/uL — ABNORMAL LOW (ref 3.87–5.11)
RDW: 13.8 % (ref 11.5–15.5)
RDW: 13.8 % (ref 11.5–15.5)
RDW: 14.3 % (ref 11.5–15.5)
WBC: 6.3 10*3/uL (ref 4.0–10.5)
WBC: 7 10*3/uL (ref 4.0–10.5)
WBC: 7.2 10*3/uL (ref 4.0–10.5)
nRBC: 0 % (ref 0.0–0.2)
nRBC: 0 % (ref 0.0–0.2)
nRBC: 0 % (ref 0.0–0.2)

## 2024-03-17 LAB — PLATELET COUNT: Platelets: 129 10*3/uL — ABNORMAL LOW (ref 150–400)

## 2024-03-17 LAB — PROTIME-INR
INR: 2 — ABNORMAL HIGH (ref 0.8–1.2)
Prothrombin Time: 22.7 s — ABNORMAL HIGH (ref 11.4–15.2)

## 2024-03-17 LAB — ECHO INTRAOPERATIVE TEE
Height: 58 in
Weight: 2624.36 [oz_av]

## 2024-03-17 LAB — APTT: aPTT: 44 s — ABNORMAL HIGH (ref 24–36)

## 2024-03-17 LAB — HEMOGLOBIN AND HEMATOCRIT, BLOOD
HCT: 23.1 % — ABNORMAL LOW (ref 36.0–46.0)
Hemoglobin: 7.7 g/dL — ABNORMAL LOW (ref 12.0–15.0)

## 2024-03-17 LAB — MAGNESIUM: Magnesium: 2.7 mg/dL — ABNORMAL HIGH (ref 1.7–2.4)

## 2024-03-17 LAB — LIPOPROTEIN A (LPA): Lipoprotein (a): 201.6 nmol/L — ABNORMAL HIGH (ref <75.0)

## 2024-03-17 SURGERY — CORONARY ARTERY BYPASS GRAFTING (CABG)
Anesthesia: General | Site: Esophagus

## 2024-03-17 MED ORDER — SODIUM CHLORIDE 0.9% FLUSH
3.0000 mL | Freq: Two times a day (BID) | INTRAVENOUS | Status: DC
  Administered 2024-03-17: 3 mL via INTRAVENOUS
  Administered 2024-03-17: 5 mL via INTRAVENOUS
  Administered 2024-03-18: 3 mL via INTRAVENOUS
  Administered 2024-03-18: 10 mL via INTRAVENOUS
  Administered 2024-03-19 – 2024-03-20 (×4): 3 mL via INTRAVENOUS

## 2024-03-17 MED ORDER — SODIUM CHLORIDE 0.9% FLUSH: 3.0000 mL | INTRAVENOUS | Status: DC | PRN

## 2024-03-17 MED ORDER — ASPIRIN 81 MG PO CHEW
324.0000 mg | CHEWABLE_TABLET | Freq: Once | ORAL | Status: AC
  Administered 2024-03-17: 324 mg via ORAL
  Filled 2024-03-17: qty 4

## 2024-03-17 MED ORDER — ONDANSETRON HCL 4 MG/2ML IJ SOLN
INTRAMUSCULAR | Status: DC | PRN
  Administered 2024-03-17: 8 mg via INTRAVENOUS

## 2024-03-17 MED ORDER — DOCUSATE SODIUM 100 MG PO CAPS
200.0000 mg | ORAL_CAPSULE | Freq: Every day | ORAL | Status: DC
  Administered 2024-03-18 – 2024-03-20 (×3): 200 mg via ORAL
  Filled 2024-03-17 (×3): qty 2

## 2024-03-17 MED ORDER — PHENYLEPHRINE 80 MCG/ML (10ML) SYRINGE FOR IV PUSH (FOR BLOOD PRESSURE SUPPORT)
PREFILLED_SYRINGE | INTRAVENOUS | Status: DC | PRN
Start: 2024-03-17 — End: 2024-03-17
  Administered 2024-03-17: 160 ug via INTRAVENOUS

## 2024-03-17 MED ORDER — ACETAMINOPHEN 500 MG PO TABS
1000.0000 mg | ORAL_TABLET | Freq: Four times a day (QID) | ORAL | Status: DC
Start: 2024-03-18 — End: 2024-03-22
  Administered 2024-03-18 – 2024-03-22 (×12): 1000 mg via ORAL
  Filled 2024-03-17 (×13): qty 2

## 2024-03-17 MED ORDER — MIDAZOLAM HCL 2 MG/2ML IJ SOLN
INTRAMUSCULAR | Status: AC
  Filled 2024-03-17: qty 2

## 2024-03-17 MED ORDER — EPHEDRINE 5 MG/ML INJ
INTRAVENOUS | Status: AC
  Filled 2024-03-17: qty 5

## 2024-03-17 MED ORDER — PROPOFOL 10 MG/ML IV BOLUS
INTRAVENOUS | Status: AC
Start: 2024-03-17 — End: ?
  Filled 2024-03-17: qty 20

## 2024-03-17 MED ORDER — FENTANYL CITRATE (PF) 250 MCG/5ML IJ SOLN
INTRAMUSCULAR | Status: AC
  Filled 2024-03-17: qty 5

## 2024-03-17 MED ORDER — TRAMADOL HCL 50 MG PO TABS
50.0000 mg | ORAL_TABLET | ORAL | Status: DC | PRN
  Administered 2024-03-18 (×2): 100 mg via ORAL
  Administered 2024-03-21: 50 mg via ORAL
  Filled 2024-03-17 (×2): qty 2
  Filled 2024-03-17: qty 1

## 2024-03-17 MED ORDER — SODIUM CHLORIDE 0.9% IV SOLUTION: Freq: Once | INTRAVENOUS | Status: AC

## 2024-03-17 MED ORDER — ONDANSETRON HCL 4 MG/2ML IJ SOLN
4.0000 mg | Freq: Four times a day (QID) | INTRAMUSCULAR | Status: DC | PRN
  Administered 2024-03-18 – 2024-03-21 (×2): 4 mg via INTRAVENOUS
  Filled 2024-03-17 (×2): qty 2

## 2024-03-17 MED ORDER — ASPIRIN 81 MG PO CHEW: 324.0000 mg | CHEWABLE_TABLET | Freq: Every day | ORAL | Status: DC

## 2024-03-17 MED ORDER — ALBUMIN HUMAN 5 % IV SOLN
250.0000 mL | INTRAVENOUS | Status: DC | PRN
  Administered 2024-03-17 (×3): 12.5 g via INTRAVENOUS
  Filled 2024-03-17: qty 250

## 2024-03-17 MED ORDER — POTASSIUM CHLORIDE 10 MEQ/50ML IV SOLN
10.0000 meq | INTRAVENOUS | Status: AC
  Administered 2024-03-17 (×3): 10 meq via INTRAVENOUS

## 2024-03-17 MED ORDER — DOBUTAMINE-DEXTROSE 4-5 MG/ML-% IV SOLN: 0.0000 ug/kg/min | INTRAVENOUS | Status: DC

## 2024-03-17 MED ORDER — PANTOPRAZOLE SODIUM 40 MG IV SOLR
40.0000 mg | Freq: Every day | INTRAVENOUS | Status: AC
  Administered 2024-03-17 – 2024-03-18 (×2): 40 mg via INTRAVENOUS
  Filled 2024-03-17 (×2): qty 10

## 2024-03-17 MED ORDER — PROTAMINE SULFATE 10 MG/ML IV SOLN
INTRAVENOUS | Status: DC | PRN
  Administered 2024-03-17: 300 mg via INTRAVENOUS

## 2024-03-17 MED ORDER — ASPIRIN 325 MG PO TBEC
325.0000 mg | DELAYED_RELEASE_TABLET | Freq: Every day | ORAL | Status: DC
  Administered 2024-03-18 – 2024-03-19 (×2): 325 mg via ORAL
  Filled 2024-03-17 (×2): qty 1

## 2024-03-17 MED ORDER — MAGNESIUM SULFATE 4 GM/100ML IV SOLN
4.0000 g | Freq: Once | INTRAVENOUS | Status: AC
  Administered 2024-03-17: 4 g via INTRAVENOUS
  Filled 2024-03-17: qty 100

## 2024-03-17 MED ORDER — PLASMA-LYTE A IV SOLN: INTRAVENOUS | Status: DC | PRN

## 2024-03-17 MED ORDER — EPHEDRINE SULFATE (PRESSORS) 50 MG/ML IJ SOLN
INTRAMUSCULAR | Status: DC | PRN
  Administered 2024-03-17: 15 mg via INTRAVENOUS
  Administered 2024-03-17: 10 mg via INTRAVENOUS

## 2024-03-17 MED ORDER — SODIUM CHLORIDE (PF) 0.9 % IJ SOLN: OROMUCOSAL | Status: DC | PRN

## 2024-03-17 MED ORDER — SODIUM BICARBONATE 8.4 % IV SOLN
50.0000 meq | Freq: Once | INTRAVENOUS | Status: AC
  Administered 2024-03-17: 50 meq via INTRAVENOUS

## 2024-03-17 MED ORDER — BISACODYL 10 MG RE SUPP: 10.0000 mg | Freq: Every day | RECTAL | Status: DC

## 2024-03-17 MED ORDER — PROPOFOL 10 MG/ML IV BOLUS
INTRAVENOUS | Status: DC | PRN
  Administered 2024-03-17: 30 mg via INTRAVENOUS

## 2024-03-17 MED ORDER — METOCLOPRAMIDE HCL 5 MG/ML IJ SOLN
10.0000 mg | Freq: Four times a day (QID) | INTRAMUSCULAR | Status: AC
  Administered 2024-03-17 – 2024-03-19 (×6): 10 mg via INTRAVENOUS
  Filled 2024-03-17 (×5): qty 2

## 2024-03-17 MED ORDER — INSULIN REGULAR(HUMAN) IN NACL 100-0.9 UT/100ML-% IV SOLN: INTRAVENOUS | Status: DC

## 2024-03-17 MED ORDER — LACTATED RINGERS IV SOLN: INTRAVENOUS | Status: DC | PRN

## 2024-03-17 MED ORDER — SUGAMMADEX SODIUM 200 MG/2ML IV SOLN
INTRAVENOUS | Status: DC | PRN
  Administered 2024-03-17: 148.8 mg via INTRAVENOUS

## 2024-03-17 MED ORDER — PHENYLEPHRINE 80 MCG/ML (10ML) SYRINGE FOR IV PUSH (FOR BLOOD PRESSURE SUPPORT)
PREFILLED_SYRINGE | INTRAVENOUS | Status: AC
  Filled 2024-03-17: qty 10

## 2024-03-17 MED ORDER — MIDAZOLAM HCL (PF) 5 MG/ML IJ SOLN
INTRAMUSCULAR | Status: DC | PRN
  Administered 2024-03-17: 2 mg via INTRAVENOUS
  Administered 2024-03-17: 1 mg via INTRAVENOUS

## 2024-03-17 MED ORDER — VANCOMYCIN HCL IN DEXTROSE 1-5 GM/200ML-% IV SOLN
1000.0000 mg | Freq: Once | INTRAVENOUS | Status: AC
  Administered 2024-03-17: 1000 mg via INTRAVENOUS
  Filled 2024-03-17: qty 200

## 2024-03-17 MED ORDER — NITROGLYCERIN 0.2 MG/ML ON CALL CATH LAB
INTRAVENOUS | Status: DC | PRN
  Administered 2024-03-17: 10 ug via INTRAVENOUS
  Administered 2024-03-17: 20 ug via INTRAVENOUS
  Administered 2024-03-17: 10 ug via INTRAVENOUS
  Administered 2024-03-17: 30 ug via INTRAVENOUS

## 2024-03-17 MED ORDER — ACETAMINOPHEN 160 MG/5ML PO SOLN
650.0000 mg | Freq: Once | ORAL | Status: AC
  Administered 2024-03-17: 650 mg
  Filled 2024-03-17: qty 20.3

## 2024-03-17 MED ORDER — ROCURONIUM BROMIDE 10 MG/ML (PF) SYRINGE
PREFILLED_SYRINGE | INTRAVENOUS | Status: AC
  Filled 2024-03-17: qty 20

## 2024-03-17 MED ORDER — CEFAZOLIN SODIUM-DEXTROSE 2-4 GM/100ML-% IV SOLN
2.0000 g | Freq: Three times a day (TID) | INTRAVENOUS | Status: AC
  Administered 2024-03-17 – 2024-03-19 (×6): 2 g via INTRAVENOUS
  Filled 2024-03-17 (×6): qty 100

## 2024-03-17 MED ORDER — DEXTROSE 50 % IV SOLN: 0.0000 mL | INTRAVENOUS | Status: DC | PRN

## 2024-03-17 MED ORDER — NOREPINEPHRINE 4 MG/250ML-% IV SOLN
0.0000 ug/min | INTRAVENOUS | Status: DC
  Administered 2024-03-18: 3 ug/min via INTRAVENOUS

## 2024-03-17 MED ORDER — SODIUM CHLORIDE 0.9 % IV SOLN: INTRAVENOUS | Status: DC | PRN

## 2024-03-17 MED ORDER — LACTATED RINGERS IV SOLN: INTRAVENOUS | Status: AC

## 2024-03-17 MED ORDER — CHLORHEXIDINE GLUCONATE 0.12 % MT SOLN
15.0000 mL | OROMUCOSAL | Status: AC
  Administered 2024-03-17: 15 mL via OROMUCOSAL
  Filled 2024-03-17: qty 15

## 2024-03-17 MED ORDER — LIDOCAINE 2% (20 MG/ML) 5 ML SYRINGE
INTRAMUSCULAR | Status: AC
  Filled 2024-03-17: qty 5

## 2024-03-17 MED ORDER — SODIUM CHLORIDE 0.45 % IV SOLN: INTRAVENOUS | Status: AC | PRN

## 2024-03-17 MED ORDER — PANTOPRAZOLE SODIUM 40 MG PO TBEC
40.0000 mg | DELAYED_RELEASE_TABLET | Freq: Every day | ORAL | Status: DC
  Administered 2024-03-19 – 2024-03-22 (×4): 40 mg via ORAL
  Filled 2024-03-17 (×4): qty 1

## 2024-03-17 MED ORDER — METOPROLOL TARTRATE 12.5 MG HALF TABLET: 12.5000 mg | ORAL_TABLET | Freq: Two times a day (BID) | ORAL | Status: DC

## 2024-03-17 MED ORDER — NICARDIPINE HCL IN NACL 20-0.86 MG/200ML-% IV SOLN
0.0000 mg/h | INTRAVENOUS | Status: DC
  Administered 2024-03-17: 5 mg/h via INTRAVENOUS
  Filled 2024-03-17: qty 200

## 2024-03-17 MED ORDER — MIDAZOLAM HCL 2 MG/2ML IJ SOLN: 2.0000 mg | INTRAMUSCULAR | Status: DC | PRN

## 2024-03-17 MED ORDER — DEXMEDETOMIDINE HCL IN NACL 400 MCG/100ML IV SOLN
0.0000 ug/kg/h | INTRAVENOUS | Status: DC
  Administered 2024-03-17: 0.7 ug/kg/h via INTRAVENOUS
  Filled 2024-03-17: qty 100

## 2024-03-17 MED ORDER — BISACODYL 5 MG PO TBEC
10.0000 mg | DELAYED_RELEASE_TABLET | Freq: Every day | ORAL | Status: DC
  Administered 2024-03-18 – 2024-03-20 (×3): 10 mg via ORAL
  Filled 2024-03-17 (×3): qty 2

## 2024-03-17 MED ORDER — OXYCODONE HCL 5 MG PO TABS
5.0000 mg | ORAL_TABLET | ORAL | Status: DC | PRN
  Administered 2024-03-17 – 2024-03-18 (×2): 5 mg via ORAL
  Filled 2024-03-17 (×2): qty 1

## 2024-03-17 MED ORDER — 0.9 % SODIUM CHLORIDE (POUR BTL) OPTIME
TOPICAL | Status: DC | PRN
Start: 2024-03-17 — End: 2024-03-17
  Administered 2024-03-17: 5000 mL

## 2024-03-17 MED ORDER — ROCURONIUM BROMIDE 10 MG/ML (PF) SYRINGE
PREFILLED_SYRINGE | INTRAVENOUS | Status: DC | PRN
  Administered 2024-03-17: 80 mg via INTRAVENOUS
  Administered 2024-03-17: 100 mg via INTRAVENOUS
  Administered 2024-03-17: 20 mg via INTRAVENOUS

## 2024-03-17 MED ORDER — METOPROLOL TARTRATE 25 MG/10 ML ORAL SUSPENSION
12.5000 mg | Freq: Two times a day (BID) | ORAL | Status: DC
  Filled 2024-03-17: qty 10

## 2024-03-17 MED ORDER — HEPARIN SODIUM (PORCINE) 1000 UNIT/ML IJ SOLN
INTRAMUSCULAR | Status: DC | PRN
  Administered 2024-03-17: 30000 [IU] via INTRAVENOUS

## 2024-03-17 MED ORDER — SODIUM CHLORIDE 0.9 % IV SOLN
250.0000 mL | INTRAVENOUS | Status: AC
  Administered 2024-03-18: 250 mL via INTRAVENOUS

## 2024-03-17 MED ORDER — SODIUM CHLORIDE 0.9% FLUSH
3.0000 mL | Freq: Two times a day (BID) | INTRAVENOUS | Status: DC
  Administered 2024-03-18 (×2): 3 mL via INTRAVENOUS

## 2024-03-17 MED ORDER — LACTATED RINGERS IV SOLN: INTRAVENOUS | Status: DC

## 2024-03-17 MED ORDER — VASOPRESSIN 20 UNIT/ML IV SOLN
INTRAVENOUS | Status: AC
  Filled 2024-03-17: qty 1

## 2024-03-17 MED ORDER — ALBUMIN HUMAN 5 % IV SOLN: INTRAVENOUS | Status: DC | PRN

## 2024-03-17 MED ORDER — METOPROLOL TARTRATE 5 MG/5ML IV SOLN: 2.5000 mg | INTRAVENOUS | Status: DC | PRN

## 2024-03-17 MED ORDER — ACETAMINOPHEN 160 MG/5ML PO SOLN
1000.0000 mg | Freq: Four times a day (QID) | ORAL | Status: DC
Start: 2024-03-18 — End: 2024-03-22
  Administered 2024-03-17 – 2024-03-19 (×4): 1000 mg
  Filled 2024-03-17 (×4): qty 40.6

## 2024-03-17 MED ORDER — ONDANSETRON HCL 4 MG/2ML IJ SOLN
INTRAMUSCULAR | Status: AC
  Filled 2024-03-17: qty 4

## 2024-03-17 MED ORDER — FENTANYL CITRATE (PF) 250 MCG/5ML IJ SOLN
INTRAMUSCULAR | Status: DC | PRN
  Administered 2024-03-17 (×5): 50 ug via INTRAVENOUS
  Administered 2024-03-17 (×2): 100 ug via INTRAVENOUS

## 2024-03-17 MED ORDER — MORPHINE SULFATE (PF) 2 MG/ML IV SOLN
1.0000 mg | INTRAVENOUS | Status: DC | PRN
  Administered 2024-03-17 – 2024-03-18 (×3): 2 mg via INTRAVENOUS
  Administered 2024-03-18: 4 mg via INTRAVENOUS
  Filled 2024-03-17 (×3): qty 1
  Filled 2024-03-17: qty 2

## 2024-03-17 MED ORDER — VASOPRESSIN 20 UNIT/ML IV SOLN
INTRAVENOUS | Status: DC | PRN
Start: 2024-03-17 — End: 2024-03-17
  Administered 2024-03-17 (×2): 1 [IU] via INTRAVENOUS

## 2024-03-17 SURGICAL SUPPLY — 75 items
BAG DECANTER FOR FLEXI CONT (MISCELLANEOUS) ×2 IMPLANT
BLADE CLIPPER SURG (BLADE) ×2 IMPLANT
BLADE STERNUM SYSTEM 6 (BLADE) ×2 IMPLANT
BNDG ELASTIC 4INX 5YD STR LF (GAUZE/BANDAGES/DRESSINGS) IMPLANT
BNDG ELASTIC 6INX 5YD STR LF (GAUZE/BANDAGES/DRESSINGS) IMPLANT
BNDG GAUZE DERMACEA FLUFF 4 (GAUZE/BANDAGES/DRESSINGS) IMPLANT
CABLE SURGICAL S-101-97-12 (CABLE) ×2 IMPLANT
CANISTER SUCT 3000ML PPV (MISCELLANEOUS) ×2 IMPLANT
CANNULA MC2 2 STG 29/37 NON-V (CANNULA) ×2 IMPLANT
CANNULA NON VENT 20FR 12 (CANNULA) ×2 IMPLANT
CATH ROBINSON RED A/P 18FR (CATHETERS) ×4 IMPLANT
CLIP RETRACTION 3.0MM CORONARY (MISCELLANEOUS) IMPLANT
CLIP TI MEDIUM 24 (CLIP) IMPLANT
CLIP TI WIDE RED SMALL 24 (CLIP) IMPLANT
CONN ST 1/2X1/2 BEN (MISCELLANEOUS) ×2 IMPLANT
CONNECTOR BLAKE 2:1 CARIO BLK (MISCELLANEOUS) ×2 IMPLANT
CONTAINER PROTECT SURGISLUSH (MISCELLANEOUS) ×4 IMPLANT
DERMABOND ADVANCED .7 DNX12 (GAUZE/BANDAGES/DRESSINGS) IMPLANT
DRAIN CHANNEL 19F RND (DRAIN) ×6 IMPLANT
DRAIN CONNECTOR BLAKE 1:1 (MISCELLANEOUS) ×2 IMPLANT
DRAPE INCISE IOBAN 66X45 STRL (DRAPES) IMPLANT
DRAPE SRG 135X102X78XABS (DRAPES) ×2 IMPLANT
DRAPE WARM FLUID 44X44 (DRAPES) ×2 IMPLANT
DRSG AQUACEL AG ADV 3.5X 6 (GAUZE/BANDAGES/DRESSINGS) IMPLANT
DRSG AQUACEL AG ADV 3.5X10 (GAUZE/BANDAGES/DRESSINGS) ×2 IMPLANT
ELECTRODE BLDE 4.0 EZ CLN MEGD (MISCELLANEOUS) ×2 IMPLANT
ELECTRODE REM PT RTRN 9FT ADLT (ELECTROSURGICAL) ×4 IMPLANT
FELT TEFLON 1X6 (MISCELLANEOUS) ×4 IMPLANT
GAUZE 4X4 16PLY ~~LOC~~+RFID DBL (SPONGE) ×2 IMPLANT
GAUZE SPONGE 4X4 12PLY STRL (GAUZE/BANDAGES/DRESSINGS) ×4 IMPLANT
GLOVE BIO SURGEON STRL SZ7 (GLOVE) ×4 IMPLANT
GLOVE BIOGEL M STRL SZ7.5 (GLOVE) ×4 IMPLANT
GOWN STRL REUS W/ TWL LRG LVL3 (GOWN DISPOSABLE) ×8 IMPLANT
GOWN STRL REUS W/ TWL XL LVL3 (GOWN DISPOSABLE) ×4 IMPLANT
HEMOSTAT POWDER SURGIFOAM 1G (HEMOSTASIS) ×4 IMPLANT
INSERT SUTURE HOLDER (MISCELLANEOUS) ×2 IMPLANT
KIT BASIN OR (CUSTOM PROCEDURE TRAY) ×2 IMPLANT
KIT SUCTION CATH 14FR (SUCTIONS) ×2 IMPLANT
KIT TURNOVER KIT B (KITS) ×2 IMPLANT
KIT VASOVIEW HEMOPRO 2 VH 4000 (KITS) ×2 IMPLANT
LEAD PACING MYOCARDI (MISCELLANEOUS) ×2 IMPLANT
MARKER GRAFT CORONARY BYPASS (MISCELLANEOUS) ×6 IMPLANT
NS IRRIG 1000ML POUR BTL (IV SOLUTION) ×10 IMPLANT
PACK E OPEN HEART (SUTURE) ×2 IMPLANT
PACK OPEN HEART (CUSTOM PROCEDURE TRAY) ×2 IMPLANT
PAD ARMBOARD POSITIONER FOAM (MISCELLANEOUS) ×4 IMPLANT
PAD ELECT DEFIB RADIOL ZOLL (MISCELLANEOUS) ×2 IMPLANT
PENCIL BUTTON HOLSTER BLD 10FT (ELECTRODE) ×2 IMPLANT
POSITIONER HEAD DONUT 9IN (MISCELLANEOUS) ×2 IMPLANT
PUNCH AORTIC ROTATE 4.0MM (MISCELLANEOUS) ×2 IMPLANT
SET MPS 3-ND DEL (MISCELLANEOUS) IMPLANT
SPONGE T-LAP 18X18 ~~LOC~~+RFID (SPONGE) ×8 IMPLANT
STOPCOCK 4 WAY LG BORE MALE ST (IV SETS) IMPLANT
SUPPORT HEART JANKE-BARRON (MISCELLANEOUS) ×2 IMPLANT
SUT BONE WAX W31G (SUTURE) ×2 IMPLANT
SUT ETHIBOND X763 2 0 SH 1 (SUTURE) ×4 IMPLANT
SUT MNCRL AB 3-0 PS2 18 (SUTURE) ×4 IMPLANT
SUT PDS AB 1 CTX 36 (SUTURE) ×4 IMPLANT
SUT PROLENE 4 0 SH DA (SUTURE) ×2 IMPLANT
SUT PROLENE 5 0 C 1 36 (SUTURE) ×6 IMPLANT
SUT PROLENE 7 0 BV1 MDA (SUTURE) ×2 IMPLANT
SUT STEEL 6MS V (SUTURE) ×4 IMPLANT
SUT VIC AB 2-0 CT1 TAPERPNT 27 (SUTURE) IMPLANT
SUT VIC AB 3-0 X1 27 (SUTURE) IMPLANT
SYSTEM SAHARA CHEST DRAIN ATS (WOUND CARE) ×2 IMPLANT
TAPE CLOTH SURG 4X10 WHT LF (GAUZE/BANDAGES/DRESSINGS) IMPLANT
TAPE PAPER 2X10 WHT MICROPORE (GAUZE/BANDAGES/DRESSINGS) IMPLANT
TOWEL GREEN STERILE (TOWEL DISPOSABLE) ×2 IMPLANT
TOWEL GREEN STERILE FF (TOWEL DISPOSABLE) ×2 IMPLANT
TRAY FOLEY SLVR 16FR TEMP STAT (SET/KITS/TRAYS/PACK) ×2 IMPLANT
TUBE SUCT INTRACARD DLP 20F (MISCELLANEOUS) IMPLANT
TUBE SUCTION CARDIAC 10FR (CANNULA) IMPLANT
TUBING LAP HI FLOW INSUFFLATIO (TUBING) ×2 IMPLANT
UNDERPAD 30X36 HEAVY ABSORB (UNDERPADS AND DIAPERS) ×2 IMPLANT
WATER STERILE IRR 1000ML POUR (IV SOLUTION) ×4 IMPLANT

## 2024-03-17 NOTE — Progress Notes (Signed)
     [address]       [place] [address]             [phone]       No events Vitals:   03/16/24 2357 03/17/24 0825  BP: (!) 109/52 138/86  Pulse: 65   Resp:  18  Temp: 97.6 F (36.4 C) 98.9 F (37.2 C)  SpO2: 98%    Alert NAD Sinus EWOB  OR today for CABG

## 2024-03-17 NOTE — Anesthesia Procedure Notes (Signed)
 Procedure Name: Intubation Date/Time: 03/17/2024 11:05 AM  Performed by: Robert Chimes, CRNAPre-anesthesia Checklist: Patient identified, Emergency Drugs available, Suction available and Patient being monitored Patient Re-evaluated:Patient Re-evaluated prior to induction Oxygen Delivery Method: Circle system utilized Preoxygenation: Pre-oxygenation with 100% oxygen Induction Type: IV induction Ventilation: Mask ventilation without difficulty Laryngoscope Size: Mac and 3 Grade View: Grade I Tube type: Oral Tube size: 8.0 mm Number of attempts: 1 Airway Equipment and Method: Stylet and Oral airway Placement Confirmation: ETT inserted through vocal cords under direct vision, positive ETCO2 and breath sounds checked- equal and bilateral Secured at: 22 cm Tube secured with: Tape Dental Injury: Teeth and Oropharynx as per pre-operative assessment

## 2024-03-17 NOTE — Brief Op Note (Signed)
 03/13/2024 - 03/17/2024  7:16 AM  PATIENT:  Linda Cardenas  68 y.o. female  PRE-OPERATIVE DIAGNOSIS:  CORONARY ARTERY DISEASE  POST-OPERATIVE DIAGNOSIS:  CORONARY ARTERY DISEASE  PROCEDURE:  Procedure(s): CORONARY ARTERY BYPASS GRAFTING (CABG) TIMES THREE USING LEFT INTERNAL MAMMARY ARTERY AND ENDOSCOPICALLY HARVESTED LEFT GREATER SAPHENOUS VEIN (N/A) ECHOCARDIOGRAM, TRANSESOPHAGEAL, INTRAOPERATIVE (N/A) Vein harvest time: Vein prep time: LIMA-LAD SVG-OM SVG-DIAG SURGEON:  Surgeons and Role:    * Lightfoot, Marinell Siad, MD - Primary  PHYSICIAN ASSISTANT: Ruhi Kopke PA-C  ASSISTANTS: LANGSTON PRESTON RNFA   ANESTHESIA:   general  EBL:  527 mL   BLOOD ADMINISTERED:none  DRAINS:  LEFT PLEURAL AND MEDIASTINAL CHEST TUBES    LOCAL MEDICATIONS USED:  NONE  SPECIMEN:  No Specimen  DISPOSITION OF SPECIMEN:  N/A  COUNTS:  YES  TOURNIQUET:  * No tourniquets in log *  DICTATION: .Dragon Dictation  PLAN OF CARE: Admit to inpatient   PATIENT DISPOSITION:  ICU - intubated and hemodynamically stable.   Delay start of Pharmacological VTE agent (>24hrs) due to surgical blood loss or risk of bleeding: yes  COMPLICATIONS: NO KNOWN

## 2024-03-17 NOTE — Procedures (Signed)
 Extubation Procedure Note  Patient Details:   Name: Linda Cardenas DOB: 11/22/1955 MRN: [id number]     Evaluation  O2 sats: stable throughout Complications: No apparent complications Patient did tolerate procedure well. Bilateral Breath Sounds: Clear, Diminished   Yes  Patient extubated to 4L [place]. Positive cuff leak. Patient VSS. VC: 457 ml NIF: -21  Arby Beam 03/17/2024, 9:21 PM

## 2024-03-17 NOTE — Transfer of Care (Signed)
 Immediate Anesthesia Transfer of Care Note  Patient: Radiation protection practitioner  Procedure(s) Performed: CORONARY ARTERY BYPASS GRAFTING (CABG) TIMES THREE USING LEFT INTERNAL MAMMARY ARTERY AND ENDOSCOPICALLY HARVESTED LEFT GREATER SAPHENOUS VEIN (Chest) ECHOCARDIOGRAM, TRANSESOPHAGEAL, INTRAOPERATIVE (Esophagus)  Patient Location: SICU  Anesthesia Type:General  Level of Consciousness: Patient remains intubated per anesthesia plan  Airway & Oxygen Therapy: Patient remains intubated per anesthesia plan  Post-op Assessment: Report given to RN and Post -op Vital signs reviewed and stable  Post vital signs: Reviewed and stable  Last Vitals:  Vitals Value Taken Time  BP 110/65   Temp 36.1 C 03/17/24 1558  Pulse 61 03/17/24 1558  Resp 16 03/17/24 1558  SpO2 100 % 03/17/24 1558  Vitals shown include unfiled device data.  Last Pain:  Vitals:   03/17/24 1000  TempSrc:   PainSc: 0-No pain      Patients Stated Pain Goal: 0 (03/17/24 0831)  Complications: No notable events documented.

## 2024-03-17 NOTE — Consult Note (Signed)
 NAME:  Linda Cardenas, MRN:  [id number], DOB:  12/09/1955, LOS: 3 ADMISSION DATE:  03/13/2024, CONSULTATION DATE:  4/28 REFERRING MD:  Dr. Deloise Ferries, CHIEF COMPLAINT:  post op   History of Present Illness:  HPI obtained from medical chart review as pt is encephalopathic, intubated on MV.  55 yoF with PMH as below significant for HFpEF, HTN, reactive airway disease, HLD, GERD, and hypothyroidism who presented with SSCP, indigestion, dizziness, and left arm tingling admitted 4/24 to [facility] with NSTEMI and bradycardia placed on empiric heparin .  Cardiology consulted, TTE showed LVEF 60-65%, normal RV.  Underwent LHC which showed 70% left main stenosis with 90% diagonal stenosis and 50% circumflex stenosis, therefore TCTS consulted and pt underwent CABG x 3.  EBL , UOP 1L, s/p cell saver , CPB 1330-1446, net +2.3L.  PCCM consulted for vent and medical management in ICU post-operatively.   Pertinent  Medical History   Past Medical History:  Diagnosis Date   Arthritis    Back pain     CHRONIC LOW BACK PAIN - GETS INJECTIONS IN BACK   Constipation    Eczema    Elevated liver enzymes    Gallstones    GERD (gastroesophageal reflux disease)    Hyperlipidemia    Hypothyroidism    Lactose intolerance    Shortness of breath    Thyroid  disease     Significant Hospital Events: Including procedures, antibiotic start and stop dates in addition to other pertinent events   4/24 admitted to [facility] with NSTEMI, cardiology/ LHC  Interim History / Subjective:   Objective   Blood pressure (!) 142/71, pulse (!) 51, temperature 98.9 F (37.2 C), temperature source Oral, resp. rate 18, height 4\' 10"  (1.473 m), weight 74.4 kg, SpO2 100%.        Intake/Output Summary (Last 24 hours) at 03/17/2024 1256 Last data filed at 03/17/2024 1238 Gross per 24 hour  Intake 730 ml  Output 250 ml  Net 480 ml   Filed Weights   03/13/24 2101  Weight: 74.4 kg   Examination: General:  critically ill older  adult female lying in bed, sedated on MV HEENT: MM pink/moist, ETT/ OGT, pupils 3/r Neuro: sedated CV: rr, SR/ SB 60, midline sternal dressing cdi, CT x2 with small amount of staining, 30ml out CT thus far, no leak/tidaling,  R internal jugular cordis, L radial aline PULM:  MV supported, coarse, no wheeze, no secretions GI: soft, bs hypo, foley -cyu Extremities: warm/dry, BLE dressings to LE  Skin: no rashes   CO 4, CI 2.4, SV 65, SVR 1280   Resolved Hospital Problem list    Assessment & Plan:   Multivessel CAD s/p CABG x3 Post bypass vasoplegia Acute postoperative respiratory insufficiency Expected post-operative ABLA Expected post-operative consumptive thrombocytopenia  HFpEF HTN HLD Symptomatic bradycardia  P:  - post-op management per TCTS - rapid wean per TCTS protocol - VAP/ PPI - CXR/ ABG, CBC, BMET, coags now> ABG with H/H 6.8/ 20 (preop 12.5/ 37)> transfuse 1uPRBC, await coags.  No significant CT output - con't weaning pressors as able to maintain MAP >70-90. Albumin boluses prn for low SV.  - tele monitoring/ pacing prn - mediastinal drains per TCTS - multimodal pain control per protocol- oxycodone , tramadol , morphine with bowel regimen - completing TXA - insulin gtt per endotool - ASA, statin, to resume next day, BB if not on pressors - complete post-op antibiotics - monitor electrolytes, replete PRN - PT/ OT   Reactive airway disease - prn  albuterol - cont breo 4/29   Hypothyroidism - normal TSH/ FT4 - cont synthroid    Obesity- BMI 34.28 kg/m2 - diet and weight loss counseling when appropriate    Best Practice (right click and "Reselect all SmartList Selections" daily)   Diet/type: NPO DVT prophylaxis not indicated Pressure ulcer(s): N/A GI prophylaxis: PPI Lines: Central line and Arterial Line Foley:  Yes, and it is still needed Code Status:  full code Last date of multidisciplinary goals of care discussion [per primary team]  Labs    CBC: Recent Labs  Lab 03/13/24 2107 03/14/24 0420 03/15/24 0223 03/17/24 0606 03/17/24 1137 03/17/24 1140  WBC 6.4 6.8 5.6 6.3  --   --   HGB 12.3 12.4 11.5* 12.5 10.5* 11.2*  HCT 37.5 37.7 35.1* 37.7 31.0* 33.0*  MCV 86.8 87.9 85.8 85.5  --   --   PLT 201 183 161 185  --   --     Basic Metabolic Panel: Recent Labs  Lab 03/13/24 2107 03/14/24 0420 03/15/24 0223 03/17/24 0606 03/17/24 1137 03/17/24 1140  NA 139 140 138 138 139 140  K 3.8 3.8 3.7 3.8 3.6 3.6  CL 106 105 103 103 104  --   CO2 24 24 27 25   --   --   GLUCOSE 104* 92 94 98 94  --   BUN 10 8 8  7* 6*  --   CREATININE 0.49 0.51 0.60 0.48 0.40*  --   CALCIUM  9.5 9.4 9.0 9.8  --   --   MG  --   --  1.8  --   --   --    GFR: Estimated Creatinine Clearance: 58.5 mL/min (A) (by C-G formula based on SCr of 0.4 mg/dL (L)). Recent Labs  Lab 03/13/24 2107 03/14/24 0420 03/15/24 0223 03/17/24 0606  WBC 6.4 6.8 5.6 6.3    Liver Function Tests: Recent Labs  Lab 03/14/24 0420  AST 28  ALT 24  ALKPHOS 42  BILITOT 0.7  PROT 6.8  ALBUMIN 3.7   No results for input(s): "LIPASE", "AMYLASE" in the last 168 hours. No results for input(s): "AMMONIA" in the last 168 hours.  ABG    Component Value Date/Time   PHART 7.459 (H) 03/17/2024 1140   PCO2ART 35.2 03/17/2024 1140   PO2ART 241 (H) 03/17/2024 1140   HCO3 24.9 03/17/2024 1140   TCO2 26 03/17/2024 1140   O2SAT 100 03/17/2024 1140     Coagulation Profile: Recent Labs  Lab 03/14/24 0420  INR 1.1    Cardiac Enzymes: No results for input(s): "CKTOTAL", "CKMB", "CKMBINDEX", "TROPONINI" in the last 168 hours.  HbA1C: Hgb A1c MFr Bld  Date/Time Value Ref Range Status  03/14/2024 04:43 AM 6.0 (H) 4.8 - 5.6 % Final    Comment:    (NOTE) Pre diabetes:          5.7%-6.4%  Diabetes:              >6.4%  Glycemic control for   <7.0% adults with diabetes     CBG: No results for input(s): "GLUCAP" in the last 168 hours.  Review of Systems:    Unable   Past Medical History:  She,  has a past medical history of Arthritis, Back pain, Constipation, Eczema, Elevated liver enzymes, Gallstones, GERD (gastroesophageal reflux disease), Hyperlipidemia, Hypothyroidism, Lactose intolerance, Shortness of breath, and Thyroid  disease.   Surgical History:   Past Surgical History:  Procedure Laterality Date   BREAST SURGERY     "PIMPLE"  REMOVED FROM BREAST IN OFFICE   CESAREAN SECTION     CHOLECYSTECTOMY  12/26/2012   Procedure: LAPAROSCOPIC CHOLECYSTECTOMY WITH INTRAOPERATIVE CHOLANGIOGRAM;  Surgeon: Keitha Pata, MD;  Location: [facility] ORS;  Service: General;  Laterality: N/A;   LEFT HEART CATH AND CORONARY ANGIOGRAPHY N/A 03/14/2024   Procedure: LEFT HEART CATH AND CORONARY ANGIOGRAPHY;  Surgeon: Knox Perl, MD;  Location: [facility] INVASIVE CV LAB;  Service: Cardiovascular;  Laterality: N/A;   LUMBAR FUSION N/A L4/5 04/2013     Social History:   reports that she has never smoked. She has never used smokeless tobacco. She reports that she does not drink alcohol and does not use drugs.   Family History:  Her family history includes CAD in her brother; Heart attack in her father; Heart disease in her father; Heart failure in her mother.   Allergies Allergies  Allergen Reactions   Beef-Derived Drug Products     Religion    Pork-Derived Products     religion     Home Medications  Prior to Admission medications   Medication Sig Start Date End Date Taking? Authorizing Provider  aspirin  81 MG tablet Take 81 mg by mouth daily.   Yes [provider]  calcium  carbonate (OS-CAL) 600 MG TABS Take 600 mg by mouth daily.   Yes [provider]  Cholecalciferol (VITAMIN D) 2000 UNITS tablet Take 2,000 Units by mouth daily.   Yes [provider]  fluticasone  furoate-vilanterol (BREO ELLIPTA ) 100-25 MCG/ACT AEPB Inhale 1 puff into the lungs daily. Patient taking differently: Inhale 1 puff into the lungs daily as needed (for  wheezing and shortness of breath). 11/24/22  Yes Olalere, Adewale A, MD  ibuprofen (ADVIL) 200 MG tablet Take 200-400 mg by mouth daily as needed for moderate pain (pain score 4-6).   Yes [provider]  levothyroxine  (SYNTHROID ) 100 MCG tablet Take 100 mcg by mouth daily before breakfast.   Yes [provider]  metoprolol  tartrate (LOPRESSOR ) 25 MG tablet Take 25 mg by mouth daily. 08/26/22  Yes [provider]  Multiple Vitamins-Minerals (MULTIVITAMIN WITH MINERALS) tablet Take 1 tablet by mouth daily.   Yes [provider]  rosuvastatin  (CRESTOR ) 20 MG tablet TAKE 1 TABLET BY MOUTH EVERY DAY Patient taking differently: Take 40 mg by mouth daily. 03/31/19  Yes Druscilla Gerhard, NP     Critical care time: [address]        Early Glisson, MSN, AG-ACNP-BC [facility] 03/17/2024, 4:21 PM  See Amion for pager If no response to pager , please call [phone] until 7pm After 7:00 pm call Elink  [phone]

## 2024-03-17 NOTE — Hospital Course (Addendum)
    History of Present Illness:    We are asked to see this 68 year old female in CT surgical consultation for consideration of CABG.  She has multiple medical comorbidities including cardiac risk factors for hyperlipidemia, hypertension, bradycardia and grade 1 diastolic heart dysfunction with preserved EF 60% who was recently evaluated for chest pain and indigestion.  Recently it is noted by her family that she has significantly less energy and a general decline in her health status.  She has not had any recent specific complaints of chest pain but does have exertional dyspnea and has to rest frequently.  She did develop chest pain today and originally presented to urgent care.  She was complaining primarily of substernal chest discomfort which she felt was primarily indigestion as it was associated with nausea.  Symptoms initially presented 2 days ago but today she began having dizziness as well as some left arm tingling.  On presentation to the ED she was found to have hypertension as well as bradycardia.  D-dimer was normal and troponin I was mildly elevated at 29 ruling in for non-STEMI.  Troponins are being trended.  She was started on heparin .  Her metoprolol  was placed on hold.  EKG showed a sinus bradycardia with a rate of 48.  Chest x-ray showed no acute abnormality other than kyphotic positioning.  She was felt to require admission for further evaluation and medical stabilization to include cardiology consultation.  Both echocardiogram and cardiac catheterization have been performed.  On echo her LV EF is estimated at 60 to 65%.  There are no regional left ventricular wall abnormalities.  Right ventricular systolic function is normal.  Left atrium is noted to be moderately dilated.  There is no significant mitral or aortic valve stenosis/regurgitation.  On cardiac catheterization she is found to have moderately calcified coronary arteries with significant at least 70% left main disease extending into  the circumflex and proximal LAD.  She otherwise has mild disease.  She has significant arthritis and chronic back pain which may be  limiting in terms of her postsurgical rehab.  Dr. Deloise Ferries evaluated the patient and all relevant studies and recommended proceeding with CABG as her best vascularization option.  Hospital course after full diagnostic evaluation and medical stabilization she was felt stable to proceed with surgery and on 03/17/2024 she was taken to the OR at which time she underwent CABG x 3.  She tolerated the procedure well and was taken to the surgical intensive care unit in stable condition.  She was extubated the evening of surgery.  She required support with Neo-synephrine which was weaned as hemodynamics allowed.  Her chest tubes, arterial lines, and flotrak were discontinued on POD #1.  She remains in NSR, but has low blood pressure and Lopressor  was held.  She was transfused a unit of pack cells for blood loss anemia.  She was started on iron supplementation.  She was felt stable for transfer to the progressive care unit on 03/19/2024.  The patient remained in NSR.  She was evaluated by PT who recommended home health which will be arranged prior to discharge.  She was started on Lasix  for a small left sided pleural effusion.   The patients HR and blood pressure increased.  She was started on Lopressor  at low dose and tolerated without difficulty.  She is ambulating without difficulty.  Surgical incisions are healing without evidence of infection.  She is stable for discharge home today.

## 2024-03-17 NOTE — Anesthesia Preprocedure Evaluation (Addendum)
 Anesthesia Evaluation  Patient identified by MRN, date of birth, ID band Patient awake    Reviewed: Allergy & Precautions, NPO status , Patient's Chart, lab work & pertinent test results  Airway Mallampati: II  TM Distance: >3 FB Neck ROM: Full    Dental no notable dental hx.    Pulmonary neg pulmonary ROS   Pulmonary exam normal        Cardiovascular hypertension, Pt. on home beta blockers + CAD and + Past MI  Normal cardiovascular exam  ECHO: 1. Left ventricular ejection fraction, by estimation, is 60 to 65%. The  left ventricle has normal function. The left ventricle has no regional  wall motion abnormalities. Left ventricular diastolic parameters are  indeterminate.   2. Right ventricular systolic function is normal. The right ventricular  size is normal.   3. Left atrial size was moderately dilated.   4. The mitral valve is normal in structure. Trivial mitral valve  regurgitation. No evidence of mitral stenosis.   5. The aortic valve is tricuspid. There is mild calcification of the  aortic valve. Aortic valve regurgitation is not visualized. No aortic  stenosis is present.   6. The inferior vena cava is normal in size with greater than 50%  respiratory variability, suggesting right atrial pressure of 3 mmHg.     Neuro/Psych negative neurological ROS  negative psych ROS   GI/Hepatic negative GI ROS, Neg liver ROS,,,  Endo/Other  Hypothyroidism    Renal/GU negative Renal ROS     Musculoskeletal  (+) Arthritis ,    Abdominal  (+) + obese  Peds  Hematology negative hematology ROS (+)   Anesthesia Other Findings CAD  Reproductive/Obstetrics                             Anesthesia Physical Anesthesia Plan  ASA: 4  Anesthesia Plan: General   Post-op Pain Management:    Induction: Intravenous  PONV Risk Score and Plan: 3 and Ondansetron , Dexamethasone , Midazolam  and Treatment  may vary due to age or medical condition  Airway Management Planned: Oral ETT  Additional Equipment: Arterial line, CVP, TEE and Ultrasound Guidance Line Placement  Intra-op Plan:   Post-operative Plan: Post-operative intubation/ventilation  Informed Consent: I have reviewed the patients History and Physical, chart, labs and discussed the procedure including the risks, benefits and alternatives for the proposed anesthesia with the patient or authorized representative who has indicated his/her understanding and acceptance.     Dental advisory given  Plan Discussed with: CRNA  Anesthesia Plan Comments: (Flotrac)       Anesthesia Quick Evaluation

## 2024-03-17 NOTE — Progress Notes (Signed)
 Patient ID: Linda Cardenas, female   DOB: [birth year], 68 y.o.   MRN: [id number]   TCTS Evening Rounds:   Hypertensive. Starting Cardene.  Sedated on vent.  Urine output good  CT output low. Some bloody drainage around the tubes.   CBC    Component Value Date/Time   WBC 6.3 03/17/2024 0606   RBC 4.41 03/17/2024 0606   HGB 7.5 (L) 03/17/2024 1504   HCT 22.0 (L) 03/17/2024 1504   PLT 129 (L) 03/17/2024 1406   MCV 85.5 03/17/2024 0606   MCH 28.3 03/17/2024 0606   MCHC 33.2 03/17/2024 0606   RDW 13.8 03/17/2024 0606     BMET    Component Value Date/Time   NA 140 03/17/2024 1504   K 3.8 03/17/2024 1504   CL 105 03/17/2024 1500   CO2 25 03/17/2024 0606   GLUCOSE 140 (H) 03/17/2024 1500   BUN 5 (L) 03/17/2024 1500   CREATININE 0.40 (L) 03/17/2024 1500   CALCIUM  9.8 03/17/2024 0606   GFRNONAA >60 03/17/2024 0606   INR 2.0  A/P: Transfusing 1 unit of PRBC's for anemia and will give a unit of FFP for INR 2.0.

## 2024-03-17 NOTE — Op Note (Signed)
 [address]       Linda Cardenas [id number]             [id number]                                          03/17/2024 Patient:  Linda Cardenas Pre-Op Dx: Unstable angina     3V CAD   HTN   Obesity Post-op Dx:  same Procedure: CABG X 3.  LIMA LAD, RSVG OM, Diagonal   Endoscopic greater saphenous vein harvest on the left   Surgeon and Role:      * Linda Cardenas, Linda Siad, MD - Primary    * Linda Cardenas , PA-C - assisting An experienced assistant was required given the complexity of this surgery and the standard of surgical care. The assistant was needed for exposure, dissection, suctioning, retraction of delicate tissues and sutures, instrument exchange and for overall help during this procedure.    Anesthesia  general EBL:  500ml Blood Administration: none Xclamp Time:  47 min Pump Time:   Drains: 19 F blake drain: L, mediastinal  Wires: venticular Counts: correct   Indications: 68yo female presents with NSTEMI. LM disease, preserved EF and no significant valve disease. Will plan for CABG 3.   Findings: Small LIMA, and vein.  Small LAD, and OM.  Good diag  Operative Technique: All invasive lines were placed in pre-op holding.  After the risks, benefits and alternatives were thoroughly discussed, the patient was brought to the operative theatre.  Anesthesia was induced, and the patient was prepped and draped in normal sterile fashion.  An appropriate surgical pause was performed, and pre-operative antibiotics were dosed accordingly.  We began with simultaneous incisions along the left leg for harvesting of the greater saphenous vein and the chest for the sternotomy.  In regards to the sternotomy, this was carried down with bovie cautery, and the sternum was divided with a reciprocating saw.  Meticulous hemostasis was obtained.  The left internal thoracic artery was exposed and harvested in in pedicled fashion.  The patient was systemically heparinized, and  the artery was divided distally, and placed in a papaverine sponge.    The sternal elevator was removed, and a retractor was placed.  The pericardium was divided in the midline and fashioned into a cradle with pericardial stitches.   After we confirmed an appropriate ACT, the ascending aorta was cannulated in standard fashion.  The right atrial appendage was used for venous cannulation site.  Cardiopulmonary bypass was initiated, and the heart retractor was placed. The cross clamp was applied, and a dose of anterograde cardioplegia was given with good arrest of the heart.  Next we exposed the lateral wall, and found a good target on the OM.  An end to side anastomosis with the vein graft was then created.  Next, we exposed the anterior wall of the heart and identified a good target on diagonal.   An arteriotomy was created.  The vein was anastomosed in an end to side fashion.  Finally, we exposed a good target on the LAD, and fashioned an end to side anastomosis between it and the LITA.  We began to re-warm, and a re-animation dose of cardioplegia was given.  The heart was de-aired, and the cross clamp was removed.  Meticulous hemostasis was obtained.    A partial occludding clamp  was then placed on the ascending aorta, and we created an end to side anastomosis between it and the proximal vein grafts.  Rings were placed on the proximal anastomosis.  Hemostasis was obtained, and we separated from cardiopulmonary bypass without event.  The heparin  was reversed with protamine.  Chest tubes and wires were placed, and the sternum was re-approximated with sternal wires.  The soft tissue and skin were re-approximated wth absorbable suture.    The patient tolerated the procedure without any immediate complications, and was transferred to the ICU in guarded condition.  Linda Cardenas

## 2024-03-17 NOTE — Plan of Care (Signed)
  Problem: Education: Goal: Understanding of CV disease, CV risk reduction, and recovery process will improve Outcome: Progressing   Problem: Activity: Goal: Ability to return to baseline activity level will improve Outcome: Progressing   Problem: Health Behavior/Discharge Planning: Goal: Ability to safely manage health-related needs after discharge will improve Outcome: Progressing

## 2024-03-17 NOTE — Progress Notes (Signed)
 Patient in OR for CABG.  Will be transferred to ICU after CABG.

## 2024-03-17 NOTE — Anesthesia Procedure Notes (Signed)
 Central Venous Catheter Insertion Performed by: Peggi Bowels, MD, anesthesiologist Start/End4/28/2025 11:05 AM, 03/17/2024 11:20 AM Patient location: OR. Preanesthetic checklist: patient identified, IV checked, site marked, risks and benefits discussed, surgical consent, monitors and equipment checked, pre-op evaluation, timeout performed and anesthesia consent Position: Trendelenburg Hand hygiene performed  and maximum sterile barriers used  Catheter size: 8.5 Fr Total catheter length 10. Sheath introducer Procedure performed using ultrasound guided technique. Ultrasound Notes:anatomy identified, needle tip was noted to be adjacent to the nerve/plexus identified, no ultrasound evidence of intravascular and/or intraneural injection and image(s) printed for medical record Attempts: 1 Following insertion, line sutured and dressing applied. Post procedure assessment: blood return through all ports, free fluid flow and no air  Patient tolerated the procedure well with no immediate complications. Additional procedure comments: Arrow triple-lumen CVC placed into PA port.

## 2024-03-17 NOTE — Anesthesia Procedure Notes (Addendum)
 Arterial Line Insertion Start/End4/28/2025 11:00 AM, 03/17/2024 11:05 AM Performed by: Peggi Bowels, MD, anesthesiologist  Patient location: OR. Preanesthetic checklist: patient identified, IV checked, site marked, risks and benefits discussed, surgical consent, monitors and equipment checked, pre-op evaluation, timeout performed and anesthesia consent Lidocaine  1% used for infiltration Left, radial was placed Catheter size: 20 G Hand hygiene performed , maximum sterile barriers used  and Seldinger technique used  Attempts: 1 Procedure performed using ultrasound guided technique. Ultrasound Notes:anatomy identified, needle tip was noted to be adjacent to the nerve/plexus identified and no ultrasound evidence of intravascular and/or intraneural injection Following insertion, dressing applied and Biopatch. Post procedure assessment: normal and unchanged  Patient tolerated the procedure well with no immediate complications.

## 2024-03-18 ENCOUNTER — Encounter (HOSPITAL_COMMUNITY): Payer: Self-pay | Admitting: Thoracic Surgery (Cardiothoracic Vascular Surgery)

## 2024-03-18 ENCOUNTER — Inpatient Hospital Stay (HOSPITAL_COMMUNITY)

## 2024-03-18 DIAGNOSIS — I214 Non-ST elevation (NSTEMI) myocardial infarction: Secondary | ICD-10-CM | POA: Diagnosis not present

## 2024-03-18 DIAGNOSIS — R739 Hyperglycemia, unspecified: Secondary | ICD-10-CM | POA: Diagnosis not present

## 2024-03-18 DIAGNOSIS — K219 Gastro-esophageal reflux disease without esophagitis: Secondary | ICD-10-CM | POA: Diagnosis not present

## 2024-03-18 LAB — MAGNESIUM
Magnesium: 2.2 mg/dL (ref 1.7–2.4)
Magnesium: 2 mg/dL (ref 1.7–2.4)

## 2024-03-18 LAB — BASIC METABOLIC PANEL WITH GFR
Anion gap: 6 (ref 5–15)
Anion gap: 9 (ref 5–15)
BUN: 5 mg/dL — ABNORMAL LOW (ref 8–23)
BUN: 7 mg/dL — ABNORMAL LOW (ref 8–23)
CO2: 25 mmol/L (ref 22–32)
CO2: 27 mmol/L (ref 22–32)
Calcium: 8.3 mg/dL — ABNORMAL LOW (ref 8.9–10.3)
Calcium: 8.8 mg/dL — ABNORMAL LOW (ref 8.9–10.3)
Chloride: 106 mmol/L (ref 98–111)
Chloride: 99 mmol/L (ref 98–111)
Creatinine, Ser: 0.54 mg/dL (ref 0.44–1.00)
Creatinine, Ser: 0.56 mg/dL (ref 0.44–1.00)
GFR, Estimated: >60 mL/min (ref >60)
GFR, Estimated: >60 mL/min (ref >60)
Glucose, Bld: 125 mg/dL — ABNORMAL HIGH (ref 70–99)
Glucose, Bld: 145 mg/dL — ABNORMAL HIGH (ref 70–99)
Potassium: 3.9 mmol/L (ref 3.5–5.1)
Potassium: 3.9 mmol/L (ref 3.5–5.1)
Sodium: 137 mmol/L (ref 135–145)
Sodium: 135 mmol/L (ref 135–145)

## 2024-03-18 LAB — CBC
HCT: 23.1 % — ABNORMAL LOW (ref 36.0–46.0)
HCT: 24.6 % — ABNORMAL LOW (ref 36.0–46.0)
Hemoglobin: 7.9 g/dL — ABNORMAL LOW (ref 12.0–15.0)
Hemoglobin: 8.2 g/dL — ABNORMAL LOW (ref 12.0–15.0)
MCH: 30 pg (ref 26.0–34.0)
MCH: 29.7 pg (ref 26.0–34.0)
MCHC: 34.2 g/dL (ref 30.0–36.0)
MCHC: 33.3 g/dL (ref 30.0–36.0)
MCV: 87.8 fL (ref 80.0–100.0)
MCV: 89.1 fL (ref 80.0–100.0)
Platelets: 102 10*3/uL — ABNORMAL LOW (ref 150–400)
Platelets: 105 10*3/uL — ABNORMAL LOW (ref 150–400)
RBC: 2.63 MIL/uL — ABNORMAL LOW (ref 3.87–5.11)
RBC: 2.76 MIL/uL — ABNORMAL LOW (ref 3.87–5.11)
RDW: 14.4 % (ref 11.5–15.5)
RDW: 14.6 % (ref 11.5–15.5)
WBC: 6.9 10*3/uL (ref 4.0–10.5)
WBC: 7.2 10*3/uL (ref 4.0–10.5)
nRBC: 0 % (ref 0.0–0.2)
nRBC: 0 % (ref 0.0–0.2)

## 2024-03-18 LAB — GLUCOSE, CAPILLARY
Glucose-Capillary: 113 mg/dL — ABNORMAL HIGH (ref 70–99)
Glucose-Capillary: 119 mg/dL — ABNORMAL HIGH (ref 70–99)
Glucose-Capillary: 119 mg/dL — ABNORMAL HIGH (ref 70–99)
Glucose-Capillary: 119 mg/dL — ABNORMAL HIGH (ref 70–99)
Glucose-Capillary: 123 mg/dL — ABNORMAL HIGH (ref 70–99)
Glucose-Capillary: 124 mg/dL — ABNORMAL HIGH (ref 70–99)
Glucose-Capillary: 133 mg/dL — ABNORMAL HIGH (ref 70–99)
Glucose-Capillary: 136 mg/dL — ABNORMAL HIGH (ref 70–99)
Glucose-Capillary: 151 mg/dL — ABNORMAL HIGH (ref 70–99)

## 2024-03-18 LAB — BPAM FFP
Blood Product Expiration Date: 202504302359
ISSUE DATE / TIME: 202504281741
Unit Type and Rh: 6200

## 2024-03-18 LAB — PREPARE FRESH FROZEN PLASMA: Unit division: 0

## 2024-03-18 MED ORDER — METHOCARBAMOL 500 MG PO TABS
250.0000 mg | ORAL_TABLET | Freq: Three times a day (TID) | ORAL | Status: DC
  Administered 2024-03-18 – 2024-03-22 (×13): 250 mg via ORAL
  Filled 2024-03-18 (×13): qty 1

## 2024-03-18 MED ORDER — INSULIN ASPART 100 UNIT/ML IJ SOLN
0.0000 [IU] | INTRAMUSCULAR | Status: DC
  Administered 2024-03-18 – 2024-03-19 (×5): 2 [IU] via SUBCUTANEOUS

## 2024-03-18 MED ORDER — GABAPENTIN 100 MG PO CAPS
100.0000 mg | ORAL_CAPSULE | Freq: Three times a day (TID) | ORAL | Status: DC
  Administered 2024-03-18 – 2024-03-22 (×13): 100 mg via ORAL
  Filled 2024-03-18 (×13): qty 1

## 2024-03-18 MED ORDER — CHLORHEXIDINE GLUCONATE CLOTH 2 % EX PADS
6.0000 | MEDICATED_PAD | Freq: Every day | CUTANEOUS | Status: DC
  Administered 2024-03-18 – 2024-03-22 (×2): 6 via TOPICAL

## 2024-03-18 MED ORDER — INSULIN ASPART 100 UNIT/ML IJ SOLN: 0.0000 [IU] | INTRAMUSCULAR | Status: DC

## 2024-03-18 MED ORDER — ROSUVASTATIN CALCIUM 20 MG PO TABS
40.0000 mg | ORAL_TABLET | Freq: Every day | ORAL | Status: DC
  Administered 2024-03-18 – 2024-03-22 (×5): 40 mg via ORAL
  Filled 2024-03-18 (×5): qty 2

## 2024-03-18 NOTE — TOC Initial Note (Signed)
 Transition of Care [facility]) - Initial/Assessment Note    Patient Details  Name: Linda Cardenas MRN: [id number] Date of Birth: [birth year]  Transition of Care [facility]) CM/SW Contact:    Benjiman Bras, RN Phone Number: [phone] 03/18/2024, 3:52 PM  Clinical Narrative:                 TOC CM spoke to pt and dtr, Irfat at bedside. Pt lives at home with husband. Was independent pta. Gave permission to speak to dtr and spouse. Offered choice for [facility]. Medicare.gov list placed on chart. Dtr agreeable to [facility] for [facility].  Will continue to follow for dc needs.   Expected Discharge Plan: Home w Home Health Services Barriers to Discharge: Continued Medical Work up   Patient Goals and CMS Choice Patient states their goals for this hospitalization and ongoing recovery are:: wants to get better CMS Medicare.gov Compare Post Acute Care list provided to:: Patient Represenative (must comment) Choice offered to / list presented to : Adult Children      Expected Discharge Plan and Services   Discharge Planning Services: CM Consult Post Acute Care Choice: Home Health Living arrangements for the past 2 months: Single Family Home                           HH Arranged: RN, PT [facility] Agency: [facility] Date HH Agency Contacted: 03/18/24 Time HH Agency Contacted: 1551 Representative spoke with at [facility] Agency: Leatha Province  Prior Living Arrangements/Services Living arrangements for the past 2 months: Single Family Home Lives with:: Spouse Patient language and need for interpreter reviewed:: Yes Do you feel safe going back to the place where you live?: Yes      Need for Family Participation in Patient Care: Yes (Comment) Care giver support system in place?: Yes (comment)   Criminal Activity/Legal Involvement Pertinent to Current Situation/Hospitalization: No - Comment as needed  Activities of Daily Living   ADL Screening (condition at time of admission) Independently  performs ADLs?: Yes (appropriate for developmental age) Is the patient deaf or have difficulty hearing?: No Does the patient have difficulty seeing, even when wearing glasses/contacts?: Yes Does the patient have difficulty concentrating, remembering, or making decisions?: No  Permission Sought/Granted Permission sought to share information with : Case Manager, Family Supports, PCP Permission granted to share information with : Yes, Verbal Permission Granted  Share Information with NAME: Irfat Roderick Civatte  Permission granted to share info w AGENCY: Home Health  Permission granted to share info w Relationship: daughter  Permission granted to share info w Contact Information: [phone]  Emotional Assessment Appearance:: Appears stated age Attitude/Demeanor/Rapport: Lethargic Affect (typically observed): Accepting Orientation: : Oriented to Self, Oriented to Place, Oriented to  Time, Oriented to Situation      Admission diagnosis:  Sinus bradycardia [R00.1] Elevated troponin [R79.89] NSTEMI (non-ST elevated myocardial infarction) (HCC) [I21.4] S/P CABG x 3 [Z95.1] Patient Active Problem List   Diagnosis Date Noted   S/P CABG x 3 03/17/2024   Chest discomfort 03/14/2024   Elevated troponin 03/14/2024   NSTEMI (non-ST elevated myocardial infarction) (HCC) 03/14/2024   Sinus bradycardia 03/14/2024   Essential hypertension 03/14/2024   Chronic diastolic CHF (congestive heart failure) (HCC) 03/14/2024   Reactive airway disease 03/14/2024   Hypothyroidism 03/14/2024   Unstable angina (HCC) 03/14/2024   Unilateral primary osteoarthritis, left knee 02/09/2021   Cholelithiasis with cholecystitis 12/11/2012   PCP:  Mordechai April, DO Pharmacy:  [id number] [address] [place] [address] Phone: [phone] Fax: [phone]     Social Drivers of Health (SDOH) Social History: SDOH Screenings   Food Insecurity: No Food Insecurity  (03/14/2024)  Housing: Low Risk  (03/14/2024)  Transportation Needs: No Transportation Needs (03/14/2024)  Utilities: Not At Risk (03/14/2024)  Social Connections: Socially Integrated (03/14/2024)  Tobacco Use: Low Risk  (03/17/2024)   SDOH Interventions:     Readmission Risk Interventions     No data to display

## 2024-03-18 NOTE — Progress Notes (Signed)
 NAME:  Linda Cardenas, MRN:  [id number], DOB:  03-18-1956, LOS: 4 ADMISSION DATE:  03/13/2024, CONSULTATION DATE:  4/28 REFERRING MD:  Dr. Deloise Ferries, CHIEF COMPLAINT:  post op   History of Present Illness:  HPI obtained from medical chart review as pt is encephalopathic, intubated on MV.  11 yoF with PMH as below significant for HFpEF, HTN, reactive airway disease, HLD, GERD, and hypothyroidism who presented with SSCP, indigestion, dizziness, and left arm tingling admitted 4/24 to [facility] with NSTEMI and bradycardia placed on empiric heparin .  Cardiology consulted, TTE showed LVEF 60-65%, normal RV.  Underwent LHC which showed 70% left main stenosis with 90% diagonal stenosis and 50% circumflex stenosis, therefore TCTS consulted and pt underwent CABG x 3.  EBL , UOP 1L, s/p cell saver , CPB 1330-1446, net +2.3L.  PCCM consulted for vent and medical management in ICU post-operatively.   Pertinent  Medical History   Past Medical History:  Diagnosis Date   Arthritis    Back pain     CHRONIC LOW BACK PAIN - GETS INJECTIONS IN BACK   Constipation    Eczema    Elevated liver enzymes    Gallstones    GERD (gastroesophageal reflux disease)    Hyperlipidemia    Hypothyroidism    Lactose intolerance    Shortness of breath    Thyroid  disease     Significant Hospital Events: Including procedures, antibiotic start and stop dates in addition to other pertinent events   4/24 admitted to [facility] with NSTEMI, cardiology/ [facility] 4/28 CABG x 3  Interim History / Subjective:  Having some pain this morning. Denies complaints otherwise. Remains on low dose NE.    Objective   Blood pressure (!) 94/49, pulse (!) 59, temperature 98.8 F (37.1 C), resp. rate 14, height 4\' 10"  (1.473 m), weight 76.3 kg, SpO2 93%. CVP:  [0 mmHg-19 mmHg] 3 mmHg CO:  [2.7 L/min-5.5 L/min] 5.5 L/min CI:  [2.4 L/min/m2-4.2 L/min/m2] 3.3 L/min/m2  Vent Mode: PSV;CPAP FiO2 (%):  [40 %-50 %] 40 % Set Rate:  [4 bmp-16 bmp] 4  bmp Vt Set:  [330 mL] 330 mL PEEP:  [5 cmH20] 5 cmH20 Pressure Support:  [10 cmH20] 10 cmH20   Intake/Output Summary (Last 24 hours) at 03/18/2024 0737 Last data filed at 03/18/2024 0700 Gross per 24 hour  Intake 5649.77 ml  Output 4312 ml  Net 1337.77 ml   Filed Weights   03/13/24 2101 03/18/24 0341  Weight: 74.4 kg 76.3 kg   Examination: General:  chronically ill appearing woman lying in bed in NAD HEENT: [place]/AT, eyes anicteric Neuro: awake, alert, answering questions appropriately CV: S1S2 RRR PULM: breathing comfortably on [place], reduced basilar breath sounds. No air leak. GI: soft, NT Extremities: warm, dry,  healing incisions BLE. Skin: no rashes  CT output 490cc  BUN 5 Cr 0.54 WBC 6.9 H/H 7.9/23.1 Platelets 102  CXR personally reviewed> atelectasis in bases, RIJ CVC EKG: NSR, no ischemic changes .  Resolved Hospital Problem list    Assessment & Plan:   3-vessel CAD with NSTEMI; LVEF 60-65% S/p CABG x 3 -post-op care per TCTS -complete post op antibiotics today -pain control per protocol- morphine, tramadol , oxycodone . Adding robaxin and gabapentin -Aspirin , statin daily. Eventually needs DAPT. -Start metoprolol  today once off NE -mobility, advance diet as tolerated -pulmonary hygiene  Hyperglycemia; h/o pre-DM. A1c 6.0 -transition to SSI PRN -goal BG <180  ABLA- expected post-op, s/p 1 unit pRBC Consumptive thrombocytopenia- expected post-op Consumptive coagulopathy- expected post-o,  s/p 1 unit FFP -monitor chest tube output  H/o Reactive airway disease -Breo, PRN albuterol  Hypothyroidism -PTA synthroid   Obesity- BMI 34.28 kg/m2 -recommend long-term weight loss   Best Practice (right click and "Reselect all SmartList Selections" daily)   Diet/type: clear liquids ADAT DVT prophylaxis SCD Pressure ulcer(s): N/A GI prophylaxis: PPI Lines: Central line and Arterial Line Foley:  removal ordered  Code Status:  full code Last date of  multidisciplinary goals of care discussion [per primary team]  Labs   CBC: Recent Labs  Lab 03/15/24 0223 03/17/24 0606 03/17/24 1137 03/17/24 1406 03/17/24 1436 03/17/24 1604 03/17/24 2105 03/17/24 2127 03/17/24 2217 03/18/24 0340  WBC 5.6 6.3  --   --   --  7.0  --  7.2  --  6.9  HGB 11.5* 12.5   < > 7.7*   < > 6.8*  6.8* 7.5* 8.6* 7.5* 7.9*  HCT 35.1* 37.7   < > 23.1*   < > 20.0*  20.7* 22.0* 25.2* 22.0* 23.1*  MCV 85.8 85.5  --   --   --  87.0  --  86.9  --  87.8  PLT 161 185  --  129*  --  80*  --  98*  --  102*   < > = values in this interval not displayed.    Basic Metabolic Panel: Recent Labs  Lab 03/14/24 0420 03/15/24 0223 03/17/24 0606 03/17/24 1137 03/17/24 1405 03/17/24 1436 03/17/24 1440 03/17/24 1500 03/17/24 1504 03/17/24 1604 03/17/24 2105 03/17/24 2127 03/17/24 2217 03/18/24 0340  NA 140 138 138   < > 137 139   < > 138   < > 142 140 140 140 137  K 3.8 3.7 3.8   < > 4.0 4.3   < > 3.8   < > 3.3* 4.1 4.4 4.2 3.9  CL 105 103 103   < > 102 102  --  105  --   --   --  106  --  106  CO2 24 27 25   --   --   --   --   --   --   --   --  26  --  25  GLUCOSE 92 94 98   < > 158* 152*  --  140*  --   --   --  132*  --  125*  BUN 8 8 7*   < > 5* 5*  --  5*  --   --   --  5*  --  5*  CREATININE 0.51 0.60 0.48   < > 0.40* 0.30*  --  0.40*  --   --   --  0.55  --  0.54  CALCIUM  9.4 9.0 9.8  --   --   --   --   --   --   --   --  8.7*  --  8.3*  MG  --  1.8  --   --   --   --   --   --   --   --   --  2.7*  --  2.2   < > = values in this interval not displayed.   GFR: Estimated Creatinine Clearance: 59.4 mL/min (by C-G formula based on SCr of 0.54 mg/dL). Recent Labs  Lab 03/17/24 0606 03/17/24 1604 03/17/24 2127 03/18/24 0340  WBC 6.3 7.0 7.2 6.9       Critical care time:      This patient  is critically ill with multiple organ system failure which requires frequent high complexity decision making, assessment, support, evaluation, and titration  of therapies. This was completed through the application of advanced monitoring technologies and extensive interpretation of multiple databases. During this encounter critical care time was devoted to patient care services described in this note for 35 minutes.  Joesph Mussel, DO 03/18/24 8:34 AM [facility]  For contact information, see Amion. If no response to pager, please call PCCM consult pager. After hours, 7PM- 7AM, please call Elink.

## 2024-03-18 NOTE — Anesthesia Postprocedure Evaluation (Signed)
 Anesthesia Post Note  Patient: Radiation protection practitioner  Procedure(s) Performed: CORONARY ARTERY BYPASS GRAFTING (CABG) TIMES THREE USING LEFT INTERNAL MAMMARY ARTERY AND ENDOSCOPICALLY HARVESTED LEFT GREATER SAPHENOUS VEIN (Chest) ECHOCARDIOGRAM, TRANSESOPHAGEAL, INTRAOPERATIVE (Esophagus)     Patient location during evaluation: ICU Anesthesia Type: General Level of consciousness: awake Pain management: pain level controlled Vital Signs Assessment: post-procedure vital signs reviewed and stable Respiratory status: spontaneous breathing, nonlabored ventilation and respiratory function stable Cardiovascular status: blood pressure returned to baseline and stable Postop Assessment: no apparent nausea or vomiting Anesthetic complications: no   No notable events documented.  Last Vitals:  Vitals:   03/18/24 0543 03/18/24 0600  BP:  (!) 100/50  Pulse: (!) 57 (!) 55  Resp: 16 13  Temp: 37.2 C 37.2 C  SpO2: 100% 100%    Last Pain:  Vitals:   03/18/24 0400  TempSrc:   PainSc: 5                  Kirandeep Fariss P Yolander Goodie

## 2024-03-18 NOTE — Progress Notes (Signed)
      [address]       [place] [address]             [phone]                 1 Day Post-Op Procedure(s) (LRB): CORONARY ARTERY BYPASS GRAFTING (CABG) TIMES THREE USING LEFT INTERNAL MAMMARY ARTERY AND ENDOSCOPICALLY HARVESTED LEFT GREATER SAPHENOUS VEIN (N/A) ECHOCARDIOGRAM, TRANSESOPHAGEAL, INTRAOPERATIVE (N/A)   Events: No events extubated _______________________________________________________________ Vitals: BP (!) 102/50   Pulse 61   Temp 99 F (37.2 C)   Resp (!) 21   Ht 4\' 10"  (1.473 m)   Wt 76.3 kg   SpO2 93%   BMI 35.16 kg/m  Filed Weights   03/13/24 2101 03/18/24 0341  Weight: 74.4 kg 76.3 kg     - Neuro: alert NAD  - Cardiovascular: sinus  Drips: levo 3.   CVP:  [0 mmHg-19 mmHg] 2 mmHg CO:  [2.7 L/min-5.9 L/min] 5.9 L/min CI:  [2.4 L/min/m2-4.2 L/min/m2] 3.4 L/min/m2  - Pulm: EWOB  ABG    Component Value Date/Time   PHART 7.356 03/17/2024 2217   PCO2ART 45.2 03/17/2024 2217   PO2ART 145 (H) 03/17/2024 2217   HCO3 25.2 03/17/2024 2217   TCO2 26 03/17/2024 2217   ACIDBASEDEF 1.0 03/17/2024 2105   O2SAT 99 03/17/2024 2217    - Abd: ND - Extremity: warm  .Intake/Output      04/28 0701 04/29 0700 04/29 0701 04/30 0700   P.O.     I.V. (mL/kg) 2545.1 (33.4) 26.1 (0.3)   Blood 873    IV Piggyback 2231.7    Total Intake(mL/kg) 5649.8 (74) 26.1 (0.3)   Urine (mL/kg/hr) 3295 (1.8) 90 (0.8)   Blood 527    Chest Tube 490 20   Total Output 4312 110   Net +1337.8 -83.9           _______________________________________________________________ Labs:    Latest Ref Rng & Units 03/18/2024    3:40 AM 03/17/2024   10:17 PM 03/17/2024    9:27 PM  CBC  WBC 4.0 - 10.5 K/uL 6.9   7.2   Hemoglobin 12.0 - 15.0 g/dL 7.9  7.5  8.6   Hematocrit 36.0 - 46.0 % 23.1  22.0  25.2   Platelets 150 - 400 K/uL 102   98       Latest Ref Rng & Units 03/18/2024    3:40 AM 03/17/2024   10:17 PM 03/17/2024    9:27 PM  CMP  Glucose 70 -  99 mg/dL 213   086   BUN 8 - 23 mg/dL 5   5   Creatinine 5.78 - 1.00 mg/dL 4.69   6.29   Sodium 528 - 145 mmol/L 137  140  140   Potassium 3.5 - 5.1 mmol/L 3.9  4.2  4.4   Chloride 98 - 111 mmol/L 106   106   CO2 22 - 32 mmol/L 25   26   Calcium  8.9 - 10.3 mg/dL 8.3   8.7     CXR: PV congestion  _______________________________________________________________  Assessment and Plan: POD 1 s/p CABG  Neuro: adjusting pain medication CV: wean levo for SBP > 100, on A/S Pulm: IS, ambulation Renal: creat stable GI: on diet Heme: stable ID: [email or web address] Endo: SSI Dispo: continue ICU care   Linda Cardenas O Linda Cardenas 03/18/2024 8:28 AM

## 2024-03-19 ENCOUNTER — Inpatient Hospital Stay (HOSPITAL_COMMUNITY)

## 2024-03-19 DIAGNOSIS — K219 Gastro-esophageal reflux disease without esophagitis: Secondary | ICD-10-CM | POA: Diagnosis not present

## 2024-03-19 DIAGNOSIS — R739 Hyperglycemia, unspecified: Secondary | ICD-10-CM | POA: Diagnosis not present

## 2024-03-19 DIAGNOSIS — I214 Non-ST elevation (NSTEMI) myocardial infarction: Secondary | ICD-10-CM | POA: Diagnosis not present

## 2024-03-19 LAB — BASIC METABOLIC PANEL WITH GFR
Anion gap: 6 (ref 5–15)
BUN: 8 mg/dL (ref 8–23)
CO2: 29 mmol/L (ref 22–32)
Calcium: 8.7 mg/dL — ABNORMAL LOW (ref 8.9–10.3)
Chloride: 102 mmol/L (ref 98–111)
Creatinine, Ser: 0.44 mg/dL (ref 0.44–1.00)
GFR, Estimated: >60 mL/min (ref >60)
Glucose, Bld: 98 mg/dL (ref 70–99)
Potassium: 3.7 mmol/L (ref 3.5–5.1)
Sodium: 137 mmol/L (ref 135–145)

## 2024-03-19 LAB — CBC
HCT: 22.2 % — ABNORMAL LOW (ref 36.0–46.0)
Hemoglobin: 7.4 g/dL — ABNORMAL LOW (ref 12.0–15.0)
MCH: 30 pg (ref 26.0–34.0)
MCHC: 33.3 g/dL (ref 30.0–36.0)
MCV: 89.9 fL (ref 80.0–100.0)
Platelets: 93 10*3/uL — ABNORMAL LOW (ref 150–400)
RBC: 2.47 MIL/uL — ABNORMAL LOW (ref 3.87–5.11)
RDW: 14.6 % (ref 11.5–15.5)
WBC: 7.6 10*3/uL (ref 4.0–10.5)
nRBC: 0 % (ref 0.0–0.2)

## 2024-03-19 LAB — GLUCOSE, CAPILLARY
Glucose-Capillary: 101 mg/dL — ABNORMAL HIGH (ref 70–99)
Glucose-Capillary: 132 mg/dL — ABNORMAL HIGH (ref 70–99)

## 2024-03-19 LAB — BPAM RBC
Blood Product Expiration Date: 202505242359
Unit Type and Rh: 5100

## 2024-03-19 MED ORDER — ~~LOC~~ CARDIAC SURGERY, PATIENT & FAMILY EDUCATION: Freq: Once | Status: AC

## 2024-03-19 MED ORDER — ASPIRIN 81 MG PO TBEC
81.0000 mg | DELAYED_RELEASE_TABLET | Freq: Every day | ORAL | Status: DC
  Administered 2024-03-20 – 2024-03-22 (×3): 81 mg via ORAL
  Filled 2024-03-19 (×3): qty 1

## 2024-03-19 MED ORDER — FERROUS SULFATE 325 (65 FE) MG PO TABS
325.0000 mg | ORAL_TABLET | Freq: Every day | ORAL | Status: DC
Start: 2024-03-20 — End: 2024-03-22
  Administered 2024-03-20 – 2024-03-22 (×3): 325 mg via ORAL
  Filled 2024-03-19 (×3): qty 1

## 2024-03-19 MED ORDER — SODIUM CHLORIDE 0.9% FLUSH
3.0000 mL | Freq: Two times a day (BID) | INTRAVENOUS | Status: DC
  Administered 2024-03-19 – 2024-03-22 (×7): 3 mL via INTRAVENOUS

## 2024-03-19 MED ORDER — POTASSIUM CHLORIDE CRYS ER 20 MEQ PO TBCR
20.0000 meq | EXTENDED_RELEASE_TABLET | ORAL | Status: AC
  Administered 2024-03-19 (×3): 20 meq via ORAL
  Filled 2024-03-19 (×3): qty 1

## 2024-03-19 MED ORDER — SODIUM CHLORIDE 0.9% FLUSH: 3.0000 mL | INTRAVENOUS | Status: DC | PRN

## 2024-03-19 MED ORDER — SODIUM CHLORIDE 0.9 % IV SOLN: 250.0000 mL | INTRAVENOUS | Status: AC | PRN

## 2024-03-19 MED ORDER — CLOPIDOGREL BISULFATE 75 MG PO TABS
75.0000 mg | ORAL_TABLET | Freq: Every day | ORAL | Status: DC
  Administered 2024-03-19 – 2024-03-22 (×4): 75 mg via ORAL
  Filled 2024-03-19 (×4): qty 1

## 2024-03-19 MED FILL — Heparin Sodium (Porcine) Inj 1000 Unit/ML: Qty: 1000 | Status: AC

## 2024-03-19 MED FILL — Electrolyte-R (PH 7.4) Solution: INTRAVENOUS | Qty: 3000 | Status: AC

## 2024-03-19 MED FILL — Albumin, Human Inj 5%: INTRAVENOUS | Qty: 250 | Status: AC

## 2024-03-19 MED FILL — Cefazolin Sodium-Dextrose IV Solution 2 GM/100ML-4%: INTRAVENOUS | Qty: 100 | Status: AC

## 2024-03-19 MED FILL — Heparin Sodium (Porcine) Inj 1000 Unit/ML: INTRAMUSCULAR | Qty: 10 | Status: AC

## 2024-03-19 MED FILL — Potassium Chloride Inj 2 mEq/ML: INTRAVENOUS | Qty: 10 | Status: AC

## 2024-03-19 MED FILL — Calcium Chloride Inj 10%: INTRAVENOUS | Qty: 10 | Status: AC

## 2024-03-19 MED FILL — Sodium Chloride IV Soln 0.9%: INTRAVENOUS | Qty: 1000 | Status: AC

## 2024-03-19 MED FILL — Sodium Bicarbonate IV Soln 8.4%: INTRAVENOUS | Qty: 50 | Status: AC

## 2024-03-19 MED FILL — Lidocaine HCl Local Preservative Free (PF) Inj 2%: INTRAMUSCULAR | Qty: 14 | Status: AC

## 2024-03-19 NOTE — Evaluation (Signed)
 Physical Therapy Evaluation Patient Details Name: Linda Cardenas MRN: [id number] DOB: 07-Jun-1956 Today's Date: 03/19/2024  History of Present Illness  Pt is a 67yo female s/p CABGx3 on 4/28. PMH: HLD, hypothyroidism and goiter, chrnoic back pain, HTN, CAD  Clinical Impression  Pt admitted with above. PTA pt living with spouse in 2 story home and functioning with occasional use of RW and use of stair lift to access bed/bath upstairs. Pt requires max encouragement to amb however tolerated well. MinA required for bed mobility at this time and pt to benefit from youth RW for optimal support as pt is 4'10". Acute PT to cont to follow.        If plan is discharge home, recommend the following: A little help with walking and/or transfers;A little help with bathing/dressing/bathroom;Assist for transportation;Help with stairs or ramp for entrance   Can travel by private vehicle        Equipment Recommendations BSC/3in1  Recommendations for Other Services       Functional Status Assessment Patient has had a recent decline in their functional status and demonstrates the ability to make significant improvements in function in a reasonable and predictable amount of time.     Precautions / Restrictions Precautions Precautions: Sternal Precaution Booklet Issued: No Precaution/Restrictions Comments: pt and spouse educated on sternal precautions with good verbal understanding, verbal cues for pt to adhere functionally Restrictions Weight Bearing Restrictions Per Provider Order: No Other Position/Activity Restrictions: limited pushing/pulling with UEs due to sternal precautions      Mobility  Bed Mobility Overal bed mobility: Needs Assistance Bed Mobility: Rolling, Sidelying to Sit, Sit to Sidelying Rolling: Min assist Sidelying to sit: Min assist, HOB elevated     Sit to sidelying: Min assist General bed mobility comments: minA for trunk elevation and for LE management back into bed     Transfers Overall transfer level: Needs assistance Equipment used: None Transfers: Sit to/from Stand Sit to Stand: Min assist           General transfer comment: minA to power up, rocking momentum, held onto pillow    Ambulation/Gait Ambulation/Gait assistance: Contact guard assist Gait Distance (Feet): 150 Feet Assistive device: Rolling walker (2 wheels) Gait Pattern/deviations: Step-through pattern, Decreased stride length Gait velocity: dec Gait velocity interpretation: <1.31 ft/sec, indicative of household ambulator   General Gait Details: 3 standing rest breaks, SPO2 > 95% on 2LO2 via . 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     Tilt Bed    Modified Rankin (Stroke Patients Only)       Balance Overall balance assessment: Needs assistance Sitting-balance support: Feet supported, No upper extremity supported Sitting balance-Leahy Scale: Fair     Standing balance support: During functional activity, No upper extremity supported Standing balance-Leahy Scale: Fair Standing balance comment: benefits from RW for abulation at this time                             Pertinent Vitals/Pain Pain Assessment Pain Assessment: Faces Faces Pain Scale: Hurts little more Pain Location: generalized with movement Pain Descriptors / Indicators: Sore Pain Intervention(s): Monitored during session    Home Living Family/patient expects to be discharged to:: Private residence Living Arrangements: Spouse/significant other Available Help at Discharge: Family;Available 24 hours/day (spouse works but sister in from out of town to stay with her) Type of Home:  House Home Access: Stairs to enter Entrance Stairs-Rails: None Entrance Stairs-Number of Steps: 1 Alternate Level Stairs-Number of Steps: flgiht Home Layout: Two level Home Equipment: Agricultural consultant (2 wheels)      Prior Function Prior Level of Function :  Independent/Modified Independent             Mobility Comments: rolling walker at times, chair lift to navigate stairs as needed ADLs Comments: indep     Extremity/Trunk Assessment   Upper Extremity Assessment Upper Extremity Assessment:  (WFL within sternal precautions)    Lower Extremity Assessment Lower Extremity Assessment: Generalized weakness    Cervical / Trunk Assessment Cervical / Trunk Assessment: Other exceptions Cervical / Trunk Exceptions: sternal incision  Communication   Communication Communication: Impaired Factors Affecting Communication: Reduced clarity of speech    Cognition Arousal: Alert Behavior During Therapy: WFL for tasks assessed/performed   PT - Cognitive impairments: No apparent impairments                         Following commands: Intact       Cueing Cueing Techniques: Verbal cues, Tactile cues     General Comments General comments (skin integrity, edema, etc.): chest incision covered by dressing, VSS on 2Lo2 via [place]    Exercises     Assessment/Plan    PT Assessment Patient needs continued PT services  PT Problem List Decreased strength;Decreased activity tolerance;Decreased balance;Decreased mobility       PT Treatment Interventions DME instruction;Gait training;Stair training;Functional mobility training;Therapeutic activities;Therapeutic exercise;Balance training    PT Goals (Current goals can be found in the Care Plan section)  Acute Rehab PT Goals Patient Stated Goal: didn't state PT Goal Formulation: With patient/family Time For Goal Achievement: 04/02/24 Potential to Achieve Goals: Good    Frequency Min 2X/week     Co-evaluation               AM-PAC PT "6 Clicks" Mobility  Outcome Measure Help needed turning from your back to your side while in a flat bed without using bedrails?: A Little Help needed moving from lying on your back to sitting on the side of a flat bed without using bedrails?:  A Little Help needed moving to and from a bed to a chair (including a wheelchair)?: A Little Help needed standing up from a chair using your arms (e.g., wheelchair or bedside chair)?: A Little Help needed to walk in hospital room?: A Little Help needed climbing 3-5 steps with a railing? : A Lot 6 Click Score: 17    End of Session Equipment Utilized During Treatment: Oxygen Activity Tolerance: Patient tolerated treatment well Patient left: in bed;with call bell/phone within reach;with nursing/sitter in room Nurse Communication: Mobility status PT Visit Diagnosis: Unsteadiness on feet (R26.81)    Time: 9629-5284 PT Time Calculation (min) (ACUTE ONLY): 26 min   Charges:   PT Evaluation $PT Eval Moderate Complexity: 1 Mod PT Treatments $Gait Training: 8-22 mins PT General Charges $$ ACUTE PT VISIT: 1 Visit         Renaee Caro, PT, DPT Acute Rehabilitation Services Secure chat preferred Office #: [phone]   Jenna Moan 03/19/2024, 10:37 AM

## 2024-03-19 NOTE — Plan of Care (Signed)
   Problem: Health Behavior/Discharge Planning: Goal: Ability to manage health-related needs will improve Outcome: Progressing   Problem: Clinical Measurements: Goal: Will remain free from infection Outcome: Progressing

## 2024-03-19 NOTE — Progress Notes (Signed)
 Patient arrived from [place], patient is alert and oriented*4, V/S obtained, CCMD notified, oriented about the unit, educated about sternal precautions, call bell in reach.   03/19/24 1238  Vitals  Temp 99 F (37.2 C)  Temp Source Oral  BP (!) 156/72  MAP (mmHg) 96  BP Location Left Arm  BP Method Automatic  Patient Position (if appropriate) Lying  Pulse Rate 85  Pulse Rate Source Monitor  ECG Heart Rate 86  Resp 10  Level of Consciousness  Level of Consciousness Alert  MEWS COLOR  MEWS Score Color Green  Oxygen Therapy  SpO2 90 %  O2 Device Room Air  Pain Assessment  Pain Scale 0-10  Pain Score 3  Pain Type Surgical pain  Pain Location Chest  Pain Orientation Mid  MEWS Score  MEWS Temp 0  MEWS Systolic 0  MEWS Pulse 0  MEWS RR 1  MEWS LOC 0  MEWS Score 1

## 2024-03-19 NOTE — Progress Notes (Signed)
   03/19/24 1400  Spiritual Encounters  Type of Visit Initial  Care provided to: Pt and family  Reason for visit Routine spiritual support  OnCall Visit No    Chaplain responded to consult request for support. Patient was drowsy due to the medications that she has taken. She said that she feels better for today. Her sister and daughter were at the bedside. Chaplain provided support, brought Advance Care Directives document and offered prayer. Chaplain will continue to follow up for HCPOA.     M.Kubra Welby Hale Resident [phone]

## 2024-03-19 NOTE — Discharge Instructions (Signed)

## 2024-03-19 NOTE — Progress Notes (Signed)
 NAME:  Linda Cardenas, MRN:  [id number], DOB:  04-28-1956, LOS: 5 ADMISSION DATE:  03/13/2024, CONSULTATION DATE:  4/28 REFERRING MD:  Dr. Deloise Ferries, CHIEF COMPLAINT:  post op   History of Present Illness:  HPI obtained from medical chart review as pt is encephalopathic, intubated on MV.  66 yoF with PMH as below significant for HFpEF, HTN, reactive airway disease, HLD, GERD, and hypothyroidism who presented with SSCP, indigestion, dizziness, and left arm tingling admitted 4/24 to [facility] with NSTEMI and bradycardia placed on empiric heparin .  Cardiology consulted, TTE showed LVEF 60-65%, normal RV.  Underwent LHC which showed 70% left main stenosis with 90% diagonal stenosis and 50% circumflex stenosis, therefore TCTS consulted and pt underwent CABG x 3.  EBL , UOP 1L, s/p cell saver , CPB 1330-1446, net +2.3L.  PCCM consulted for vent and medical management in ICU post-operatively.   Pertinent  Medical History   Past Medical History:  Diagnosis Date   Arthritis    Back pain     CHRONIC LOW BACK PAIN - GETS INJECTIONS IN BACK   Constipation    Eczema    Elevated liver enzymes    Gallstones    GERD (gastroesophageal reflux disease)    Hyperlipidemia    Hypothyroidism    Lactose intolerance    Shortness of breath    Thyroid  disease     Significant Hospital Events: Including procedures, antibiotic start and stop dates in addition to other pertinent events   4/24 admitted to [facility] with NSTEMI, cardiology/ [facility] 4/28 CABG x 3 4/30 stable this a.m. tentative plan to transfer out of ICU per TCTS  Interim History / Subjective:  Ambulating back from bathroom, states she feels well   Objective   Blood pressure (!) 110/48, pulse 73, temperature 98.1 F (36.7 C), temperature source Axillary, resp. rate (!) 21, height 4\' 10"  (1.473 m), weight 75.8 kg, SpO2 93%. CVP:  [0 mmHg-18 mmHg] 0 mmHg      Intake/Output Summary (Last 24 hours) at 03/19/2024 0857 Last data filed at 03/19/2024  0600 Gross per 24 hour  Intake 335.32 ml  Output 1085 ml  Net -749.68 ml   Filed Weights   03/13/24 2101 03/18/24 0341 03/19/24 0408  Weight: 74.4 kg 76.3 kg 75.8 kg   Examination: General: Well-appearing middle-aged female seen on the bed in no acute distress HEENT: ETT, MM pink/moist, PERRL,  Neuro: C/AT alert and oriented x 3, nonfocal CV: s1s2 regular rate and rhythm, no murmur, rubs, or gallops,  PULM: Clear to auscultation bilaterally, no increased work of breathing, breath sounds GI: soft, bowel sounds active in all 4 quadrants, non-tender, non-distended, tolerating  Extremities: warm/dry, no edema  Skin: no rashes or lesions  Resolved Hospital Problem list    Assessment & Plan:   3-vessel CAD with NSTEMI; LVEF 60-65% S/p CABG x 3 P: Management per TCTS   Multimodal pain control  Continue aspirin  and statin DAPT per TCTS  Hyperglycemia; h/o pre-DM. A1c 6.0 P: SSI as needed CBG goal less than 180  ABLA- expected  -Post-op, s/p 1 unit pRBC Consumptive thrombocytopenia- expected post-op Consumptive coagulopathy- expected post-o, s/p 1 unit FFP P: Trend CBC Transfuse per protocol Hemoglobin goal greater than 7  H/o Reactive airway disease P: As needed bronchodilators Continue home Breo  Hypothyroidism P: Synthroid   Obesity -BMI 34.28 kg/m2 P: Educated on long-term weight goals  PCCM will sign off. Thank you for the opportunity to participate in this patient's care. Please contact if we  can be of further assistance.  Best Practice (right click and "Reselect all SmartList Selections" daily)   Diet/type: clear liquids ADAT DVT prophylaxis SCD Pressure ulcer(s): N/A GI prophylaxis: PPI Lines: Central line and Arterial Line Foley:  removal ordered  Code Status:  full code Last date of multidisciplinary goals of care discussion: Continue to update patient daily    Critical care time: NA  Corisa Montini D. Harris, NP-C [facility] Personal contact information can be found on Amion  If no contact or response made please call [phone] 03/19/2024, 9:42 AM

## 2024-03-19 NOTE — Progress Notes (Addendum)
 2 Days Post-Op Procedure(s) (LRB): CORONARY ARTERY BYPASS GRAFTING (CABG) TIMES THREE USING LEFT INTERNAL MAMMARY ARTERY AND ENDOSCOPICALLY HARVESTED LEFT GREATER SAPHENOUS VEIN (N/A) ECHOCARDIOGRAM, TRANSESOPHAGEAL, INTRAOPERATIVE (N/A) Subjective: Linda Cardenas is awake, alert and oriented sitting  in a recliner. She reports her pain is well controlled and has minimal discomfort at her sternotomy site. She has good appetite and has passed gassed but no BM yet   Objective: Vital signs in last 24 hours: Temp:  [97.8 F (36.6 C)-101.1 F (38.4 C)] 98.1 F (36.7 C) (04/30 0700) Pulse Rate:  [53-99] 82 (04/30 0800) Cardiac Rhythm: Sinus bradycardia (04/29 2000) Resp:  [13-32] 28 (04/30 0800) BP: (80-140)/(37-111) 110/48 (04/30 0800) SpO2:  [84 %-100 %] 98 % (04/30 0800) Arterial Line BP: (104-155)/(35-68) 136/42 (04/29 1215) Weight:  [75.8 kg] 75.8 kg (04/30 0408)  Hemodynamic parameters for last 24 hours: CVP:  [0 mmHg-18 mmHg] 0 mmHg  Intake/Output from previous day: 04/29 0701 - 04/30 0700 In: 361.4 [I.V.:61.7; IV Piggyback:299.7] Out: 1195 [Urine:1105; Chest Tube:90] Intake/Output this shift: No intake/output data recorded.  General appearance: alert, cooperative, and no distress Neurologic: intact Heart: regular rate and rhythm Lungs: clear to auscultation bilaterally Abdomen: soft, non-tender; bowel sounds normal; no masses,  no organomegaly Extremities: extremities normal, atraumatic, no cyanosis or edema Wound: dry, no signs of infection  Lab Results: Recent Labs    03/18/24 1616 03/19/24 0408  WBC 7.2 7.6  HGB 8.2* 7.4*  HCT 24.6* 22.2*  PLT 105* 93*   BMET:  Recent Labs    03/18/24 1616 03/19/24 0408  NA 135 137  K 3.9 3.7  CL 99 102  CO2 27 29  GLUCOSE 145* 98  BUN 7* 8  CREATININE 0.56 0.44  CALCIUM  8.8* 8.7*    PT/INR:  Recent Labs    03/17/24 1604  LABPROT 22.7*  INR 2.0*   ABG    Component Value Date/Time   PHART 7.356 03/17/2024 2217    HCO3 25.2 03/17/2024 2217   TCO2 26 03/17/2024 2217   ACIDBASEDEF 1.0 03/17/2024 2105   O2SAT 99 03/17/2024 2217   CBG (last 3)  Recent Labs    03/18/24 2336 03/19/24 0331 03/19/24 0806  GLUCAP 124* 101* 132*    Assessment/Plan: S/P Procedure(s) (LRB): CORONARY ARTERY BYPASS GRAFTING (CABG) TIMES THREE USING LEFT INTERNAL MAMMARY ARTERY AND ENDOSCOPICALLY HARVESTED LEFT GREATER SAPHENOUS VEIN (N/A) ECHOCARDIOGRAM, TRANSESOPHAGEAL, INTRAOPERATIVE (N/A)  Cardio: NSR, BP soft, Hr controlled on lopressor . Hold lopressor  given soft BP Pulm: on RA, SP02 > 90. Continue to use IS as instructed and ambulate. GI: -BM, but passing gas. Tolerating appetite well with no n/v. Continue to use stool softners as needed Endocrine: Hx of Hypothyroidism,on Levothyroxine . BG normal on SQ insulin Heme: H&H is trending down 7.4/22.2. continue to monitor patient hemodynamically stable. Will give iron supplements Wound: dry no signs of infection. Healing well Dispo: d/c from ICU to floor. Patient progressing well likely to be d/c in next few days       LOS: 5 days    [facility] 03/19/2024  Agree with above assessment and plan... hold Lopressor  in setting of low BP... transfer to 4E, PT/OT consults remain pending  Gates Kasal, PA-C 9:04 AM 03/19/24  Agree Floor Linda Cardenas [place]

## 2024-03-19 NOTE — Discharge Summary (Signed)
 [address]       [place] [address]             [phone]    Physician Discharge Summary  Patient ID: Linda Cardenas MRN: [id number] DOB/AGE: 1956-07-08 68 y.o.  Admit date: 03/13/2024 Discharge date: 03/22/2024  Admission Diagnoses:  Patient Active Problem List   Diagnosis Date Noted   Chest discomfort 03/14/2024   Elevated troponin 03/14/2024   NSTEMI (non-ST elevated myocardial infarction) (HCC) 03/14/2024   Sinus bradycardia 03/14/2024   Essential hypertension 03/14/2024   Chronic diastolic CHF (congestive heart failure) (HCC) 03/14/2024   Reactive airway disease 03/14/2024   Hypothyroidism 03/14/2024   Unstable angina (HCC) 03/14/2024   Unilateral primary osteoarthritis, left knee 02/09/2021   Cholelithiasis with cholecystitis 12/11/2012   Discharge Diagnoses:  Patient Active Problem List   Diagnosis Date Noted   Hyperglycemia 03/19/2024   S/P CABG x 3 03/17/2024   Chest discomfort 03/14/2024   Elevated troponin 03/14/2024   NSTEMI (non-ST elevated myocardial infarction) (HCC) 03/14/2024   Sinus bradycardia 03/14/2024   Essential hypertension 03/14/2024   Chronic diastolic CHF (congestive heart failure) (HCC) 03/14/2024   Reactive airway disease 03/14/2024   Hypothyroidism 03/14/2024   Unstable angina (HCC) 03/14/2024   Unilateral primary osteoarthritis, left knee 02/09/2021   Cholelithiasis with cholecystitis 12/11/2012   Discharged Condition: good     History of Present Illness:    We are asked to see this 68 year old female in CT surgical consultation for consideration of CABG.  She has multiple medical comorbidities including cardiac risk factors for hyperlipidemia, hypertension, bradycardia and grade 1 diastolic heart dysfunction with preserved EF 60% who was recently evaluated for chest pain and indigestion.  Recently it is noted by her family that she has significantly less energy and a general decline in her health status.  She has  not had any recent specific complaints of chest pain but does have exertional dyspnea and has to rest frequently.  She did develop chest pain today and originally presented to urgent care.  She was complaining primarily of substernal chest discomfort which she felt was primarily indigestion as it was associated with nausea.  Symptoms initially presented 2 days ago but today she began having dizziness as well as some left arm tingling.  On presentation to the ED she was found to have hypertension as well as bradycardia.  D-dimer was normal and troponin I was mildly elevated at 29 ruling in for non-STEMI.  Troponins are being trended.  She was started on heparin .  Her metoprolol  was placed on hold.  EKG showed a sinus bradycardia with a rate of 48.  Chest x-ray showed no acute abnormality other than kyphotic positioning.  She was felt to require admission for further evaluation and medical stabilization to include cardiology consultation.  Both echocardiogram and cardiac catheterization have been performed.  On echo her LV EF is estimated at 60 to 65%.  There are no regional left ventricular wall abnormalities.  Right ventricular systolic function is normal.  Left atrium is noted to be moderately dilated.  There is no significant mitral or aortic valve stenosis/regurgitation.  On cardiac catheterization she is found to have moderately calcified coronary arteries with significant at least 70% left main disease extending into the circumflex and proximal LAD.  She otherwise has mild disease.  She has significant arthritis and chronic back pain which may be  limiting in terms of her postsurgical rehab.  Dr. Deloise Ferries evaluated the patient and  all relevant studies and recommended proceeding with CABG as her best vascularization option.  Hospital course after full diagnostic evaluation and medical stabilization she was felt stable to proceed with surgery and on 03/17/2024 she was taken to the OR at which time she  underwent CABG x 3.  She tolerated the procedure well and was taken to the surgical intensive care unit in stable condition.  She was extubated the evening of surgery.  She required support with Neo-synephrine which was weaned as hemodynamics allowed.  Her chest tubes, arterial lines, and flotrak were discontinued on POD #1.  She remains in NSR, but has low blood pressure and Lopressor  was held.  She was transfused a unit of pack cells for blood loss anemia.  She was started on iron supplementation.  She was felt stable for transfer to the progressive care unit on 03/19/2024.  The patient remained in NSR.  She was evaluated by PT who recommended home health which will be arranged prior to discharge.  She was started on Lasix  for a small left sided pleural effusion.   The patients HR and blood pressure increased.  She was started on Lopressor  at low dose and tolerated without difficulty.  She is ambulating without difficulty.  Surgical incisions are healing without evidence of infection.  She is stable for discharge home today.  Consults: pulmonary/intensive care  Significant Diagnostic Studies: angiography:   Cardiac Catheterization 03/14/24: Hemodynamic data: LVEDP 19 mmHg, moderately elevated.  No pressure gradient across the aortic valve.   Angiographic data: LM: Moderately calcified coronary arteries.  Distal left main is at least 70% narrowed and extends into CX and proximal LAD. LAD: Moderate coronary calcification of proximal and mid LAD, ostial and proximal LAD 20 to 30% stenosed.  Large D1 with ostial 99% stenosis. Cx: Large-caliber vessel, large OM 3, ostial CX 50% stenosed and calcific. RCA: Dominant.  Mild disease in the proximal segment, PL branch has a proximal ostial 40% stenosis  Impression and recommendations: Significant left main disease, patient needs consultation for CABG.   Treatments: surgery:   03/17/2024 Patient:  Linda Cardenas Pre-Op Dx: Unstable angina                          3V CAD                         HTN                         Obesity Post-op Dx:  same Procedure: CABG X 3.  LIMA LAD, RSVG OM, Diagonal   Endoscopic greater saphenous vein harvest on the left     Surgeon and Role:      * Lightfoot, Marinell Siad, MD - Primary    * E. Kierstyn Baranowski , PA-C - assisting An experienced assistant was required given the complexity of this surgery and the standard of surgical care. The assistant was needed for exposure, dissection, suctioning, retraction of delicate tissues and sutures, instrument exchange and for overall help during this procedure.     Discharge Exam: Blood pressure 110/68, pulse 67, temperature 97.6 F (36.4 C), temperature source Oral, resp. rate 15, height 4\' 10"  (1.473 m), weight 73.2 kg, SpO2 96%.  General appearance: alert, cooperative, and no distress Heart: regular rate and rhythm Lungs: clear to auscultation bilaterally Abdomen: soft, non-tender; bowel sounds normal; no masses,  no organomegaly Extremities: edema trace Wound: clean  and dry   Discharge Medications:  The patient has been discharged on:   1.Beta Blocker:  Yes [  X ]                              No   [   ]                              If No, reason:  2.Ace Inhibitor/ARB: Yes [   ]                                     No  [ X   ]                                     If No, reason: labile BP, titration of BB  3.Statin:   Yes [ X  ]                  No  [   ]                  If No, reason:  4.Ecasa:  Yes  [  X ]                  No   [   ]                  If No, reason:  Patient had ACS upon admission:Yes  Plavix /P2Y12 inhibitor: Yes [ X  ]                                      No  [   ]   Discharge Instructions     AMB Referral to Cardiac Rehabilitation - Phase II   Complete by: As directed    Diagnosis: CABG   CABG X ___: 3   After initial evaluation and assessments completed: Virtual Based Care may be provided alone or in conjunction with Phase 2  Cardiac Rehab based on patient barriers.: Yes   Intensive Cardiac Rehabilitation (ICR) [facility] location only OR Traditional Cardiac Rehabilitation (TCR) *If criteria for ICR are not met will enroll in TCR [facility] only): Yes   Amb Referral to Cardiac Rehabilitation   Complete by: As directed    Diagnosis: CABG   CABG X ___: 3   After initial evaluation and assessments completed: Virtual Based Care may be provided alone or in conjunction with Phase 2 Cardiac Rehab based on patient barriers.: Yes   Intensive Cardiac Rehabilitation (ICR) [facility] location only OR Traditional Cardiac Rehabilitation (TCR) *If criteria for ICR are not met will enroll in TCR [facility] only): Yes      Allergies as of 03/22/2024       Reactions   Beef-derived Drug Products    Religion   Pork-derived Products    religion        Medication List     STOP taking these medications    ibuprofen 200 MG tablet Commonly known as: ADVIL       TAKE these medications    acetaminophen  500 MG tablet Commonly known as: TYLENOL  Take  1-2 tablets (500-1,000 mg total) by mouth every 6 (six) hours as needed.   aspirin  81 MG tablet Take 81 mg by mouth daily.   calcium  carbonate 600 MG Tabs tablet Commonly known as: OS-CAL Take 600 mg by mouth daily.   clopidogrel  75 MG tablet Commonly known as: PLAVIX  Take 1 tablet (75 mg total) by mouth daily.   ferrous sulfate  325 (65 FE) MG tablet Take 1 tablet (325 mg total) by mouth daily with breakfast.   fluticasone  furoate-vilanterol 100-25 MCG/ACT Aepb Commonly known as: Breo Ellipta  Inhale 1 puff into the lungs daily. What changed:  when to take this reasons to take this   furosemide  40 MG tablet Commonly known as: LASIX  Take 1 tablet (40 mg total) by mouth daily. Start taking on: Mar 23, 2024   levothyroxine  100 MCG tablet Commonly known as: SYNTHROID  Take 100 mcg by mouth daily before breakfast.   methocarbamol  500 MG tablet Commonly known as: ROBAXIN  Take 0.5 tablets  (250 mg total) by mouth every 8 (eight) hours as needed for muscle spasms.   metoprolol  tartrate 25 MG tablet Commonly known as: LOPRESSOR  Take 0.5 tablets (12.5 mg total) by mouth 2 (two) times daily. What changed:  how much to take when to take this   multivitamin with minerals tablet Take 1 tablet by mouth daily.   ondansetron  4 MG tablet Commonly known as: Zofran  Take 1 tablet (4 mg total) by mouth every 8 (eight) hours as needed for nausea or vomiting.   potassium chloride  SA 20 MEQ tablet Commonly known as: KLOR-CON  M Take 1 tablet (20 mEq total) by mouth daily.   rosuvastatin  20 MG tablet Commonly known as: CRESTOR  TAKE 1 TABLET BY MOUTH EVERY DAY What changed: how much to take   traMADol  50 MG tablet Commonly known as: ULTRAM  Take 1 tablet (50 mg total) by mouth every 4 (four) hours as needed for moderate pain (pain score 4-6).   Vitamin D 50 MCG (2000 UT) tablet Take 2,000 Units by mouth daily.               Durable Medical Equipment  (From admission, onward)           Start     Ordered   03/20/24 0732  For home use only DME Bedside commode  Once       Question:  Patient needs a bedside commode to treat with the following condition  Answer:  Physical deconditioning   03/20/24 0731   03/20/24 0732  For home use only DME 3 n 1  Once        03/20/24 0731            Follow-up Information     [facility] Follow up.   Why: Home Health agency will call to arrange appts Contact information: [phone]        [facility] [place] Follow up on 04/08/2024.   Specialty: Cardiology Why: Appointment is at 10:55, Contact information: [address] [place]  [address] [phone]        [facility] [place] Follow up on 04/04/2024.   Specialty: Cardiothoracic Surgery Why: Appointment is virtual... you do not need to come to our  office, Dr. Deloise Ferries will call you at 2:00 Contact information: [address], [address] [place]  [address] [phone]  Signed:  Gates Kasal, PA-C  03/22/2024, 8:59 AM

## 2024-03-20 ENCOUNTER — Inpatient Hospital Stay (HOSPITAL_COMMUNITY)

## 2024-03-20 LAB — BPAM RBC
Blood Product Unit Number: 202505242359
ISSUE DATE / TIME: 202504281358
ISSUE DATE / TIME: 202504301528
ISSUE DATE / TIME: 202505222359
PRODUCT CODE: 202504281646
PRODUCT CODE: 202505212359
PRODUCT CODE: 202505242359
PRODUCT CODE: 202505242359
PRODUCT CODE: 202505242359
PRODUCT CODE: 202505242359
Unit Type and Rh: 202504281646
Unit Type and Rh: 202505212359
Unit Type and Rh: 202505212359
Unit Type and Rh: 202505222359
Unit Type and Rh: 5100
Unit Type and Rh: 5100
Unit Type and Rh: 5100
Unit Type and Rh: 5100
Unit Type and Rh: 5100
Unit Type and Rh: 5100

## 2024-03-20 LAB — TYPE AND SCREEN
ABO/RH(D): O POS
Antibody Screen: NEGATIVE
Unit division: 0
Unit division: 0
Unit division: 0
Unit division: 0
Unit division: 0

## 2024-03-20 LAB — BASIC METABOLIC PANEL WITH GFR
Anion gap: 8 (ref 5–15)
BUN: 8 mg/dL (ref 8–23)
CO2: 27 mmol/L (ref 22–32)
Calcium: 8.9 mg/dL (ref 8.9–10.3)
Chloride: 103 mmol/L (ref 98–111)
Creatinine, Ser: 0.48 mg/dL (ref 0.44–1.00)
GFR, Estimated: >60 mL/min (ref >60)
Glucose, Bld: 95 mg/dL (ref 70–99)
Potassium: 3.8 mmol/L (ref 3.5–5.1)
Sodium: 138 mmol/L (ref 135–145)

## 2024-03-20 LAB — CBC
HCT: 22.2 % — ABNORMAL LOW (ref 36.0–46.0)
Hemoglobin: 7.3 g/dL — ABNORMAL LOW (ref 12.0–15.0)
MCH: 29.2 pg (ref 26.0–34.0)
MCHC: 32.9 g/dL (ref 30.0–36.0)
MCV: 88.8 fL (ref 80.0–100.0)
Platelets: 111 10*3/uL — ABNORMAL LOW (ref 150–400)
RBC: 2.5 MIL/uL — ABNORMAL LOW (ref 3.87–5.11)
RDW: 14.7 % (ref 11.5–15.5)
WBC: 7 10*3/uL (ref 4.0–10.5)
nRBC: 0 % (ref 0.0–0.2)

## 2024-03-20 MED ORDER — FUROSEMIDE 10 MG/ML IJ SOLN
40.0000 mg | Freq: Once | INTRAMUSCULAR | Status: AC
  Administered 2024-03-20: 40 mg via INTRAVENOUS
  Filled 2024-03-20: qty 4

## 2024-03-20 MED ORDER — POTASSIUM CHLORIDE CRYS ER 20 MEQ PO TBCR
20.0000 meq | EXTENDED_RELEASE_TABLET | Freq: Every day | ORAL | Status: DC
  Administered 2024-03-20 – 2024-03-22 (×3): 20 meq via ORAL
  Filled 2024-03-20 (×3): qty 1

## 2024-03-20 MED ORDER — MAGNESIUM OXIDE -MG SUPPLEMENT 400 (240 MG) MG PO TABS
400.0000 mg | ORAL_TABLET | Freq: Two times a day (BID) | ORAL | Status: DC
  Administered 2024-03-20 – 2024-03-22 (×5): 400 mg via ORAL
  Filled 2024-03-20 (×5): qty 1

## 2024-03-20 NOTE — Progress Notes (Signed)
 Mobility Specialist Progress Note:    03/20/24 1030  Mobility  Activity Ambulated with assistance in hallway  Level of Assistance Contact guard assist, steadying assist  Assistive Device Front wheel walker  Distance Ambulated (ft) 200 ft  RUE Weight Bearing Per Provider Order NWB  LUE Weight Bearing Per Provider Order NWB  Activity Response Tolerated well  Mobility Referral Yes  Mobility visit 1 Mobility  Mobility Specialist Start Time (ACUTE ONLY) 1002  Mobility Specialist Stop Time (ACUTE ONLY) 1018  Mobility Specialist Time Calculation (min) (ACUTE ONLY) 16 min   Pt received in chair agreeable to mobility. No physical assistance needed. Requested to use BR before going into halls. Pt was able to practice sternal precautions w/o fault. No c/o throughout. Returned to room w/o fault. Left in chair w/ call bell and personal belongings in reach. All needs met. Family in room.  Inetta Manes Mobility Specialist  Please contact vis Secure Chat or  Rehab Office [phone]

## 2024-03-20 NOTE — Evaluation (Signed)
 Occupational Therapy Evaluation Patient Details Name: Linda Cardenas MRN: [id number] DOB: 11-Feb-1956 Today's Date: 03/20/2024   History of Present Illness   Pt is a 67yo female s/p CABGx3 on 4/28. PMH: HLD, hypothyroidism and goiter, chrnoic back pain, HTN, CAD     Clinical Impressions Pt reported at PLOF they use walker in the morning due to stiffness and uses a chair lift to go to the second floor of the home were a full bath and bedroom is at. However, they do have a bedroom on the main level and half bath. Pt and husband reported they will probably stay on the main level of the home during the day and take the chair lift up to second floor during the evenings. Pt at this time required CGA for sit to stand and for ambulation with RW. Pt requires min assist for LE ADLS and  supervision to min assist for UE ADLS. At this time recommendation for [facility] with family support. Pt and husband reviewed DME needs with pt and educated on modifications.      If plan is discharge home, recommend the following:   A little help with walking and/or transfers;A little help with bathing/dressing/bathroom;Assistance with cooking/housework;Assist for transportation;Help with stairs or ramp for entrance     Functional Status Assessment   Patient has had a recent decline in their functional status and demonstrates the ability to make significant improvements in function in a reasonable and predictable amount of time.     Equipment Recommendations   BSC/3in1;Tub/shower bench     Recommendations for Other Services         Precautions/Restrictions   Precautions Precautions: Sternal Recall of Precautions/Restrictions: Impaired Precaution/Restrictions Comments: pt's husband able to report all precautions and wife able to report but when functionally completion of tasks need some cues Restrictions Weight Bearing Restrictions Per Provider Order: Yes RUE Weight Bearing Per Provider Order: Non weight  bearing LUE Weight Bearing Per Provider Order: Non weight bearing Other Position/Activity Restrictions: limited pushing/pulling with UEs due to sternal precautions     Mobility Bed Mobility Overal bed mobility: Needs Assistance Bed Mobility: Sit to Supine       Sit to supine: Mod assist        Transfers Overall transfer level: Needs assistance Equipment used: Rolling walker (2 wheels) Transfers: Sit to/from Stand Sit to Stand: Contact guard assist           General transfer comment: pt cued about hand placement to follow precautions      Balance Overall balance assessment: Needs assistance Sitting-balance support: Feet supported, No upper extremity supported Sitting balance-Leahy Scale: Fair     Standing balance support: Bilateral upper extremity supported, No upper extremity supported, Single extremity supported Standing balance-Leahy Scale: Fair Standing balance comment: benefits from RW for abulation at this time due to past hx back an knee sx                           ADL either performed or assessed with clinical judgement   ADL Overall ADL's : Needs assistance/impaired Eating/Feeding: Independent   Grooming: Wash/dry hands;Wash/dry face;Set up;Sitting   Upper Body Bathing: Min ;Sitting   Lower Body Bathing: Minimal assistance;Cueing for safety   Upper Body Dressing : Min;Sitting   Lower Body Dressing: Minimal assistance;Sit to/from stand;Sitting/lateral leans   Toilet Transfer: Contact guard assist   Toileting- Clothing Manipulation and Hygiene: Contact guard assist;Minimal assistance   Tub/ Shower Transfer: Minimal assistance;Cueing for  safety   Functional mobility during ADLs: Contact guard assist;Rolling walker (2 wheels)       Vision Baseline Vision/History: 1 Wears glasses Ability to See in Adequate Light: 0 Adequate Patient Visual Report: No change from baseline Vision Assessment?: No apparent visual deficits      Perception Perception: Within Functional Limits       Praxis Praxis: WFL       Pertinent Vitals/Pain Pain Assessment Pain Assessment: No/denies pain Facial Expression: Relaxed, neutral Body Movements: Absence of movements Muscle Tension: Relaxed Compliance with ventilator (intubated pts.): N/A Vocalization (extubated pts.): N/A CPOT Total: 0 Pain Intervention(s): Limited activity within patient's tolerance     Extremity/Trunk Assessment Upper Extremity Assessment Upper Extremity Assessment:  (limited due to sternal precautions.)   Lower Extremity Assessment Lower Extremity Assessment: Defer to PT evaluation   Cervical / Trunk Assessment Cervical / Trunk Assessment: Other exceptions Cervical / Trunk Exceptions: sternal incision   Communication Communication Communication: Impaired Factors Affecting Communication: Reduced clarity of speech   Cognition Arousal: Alert Behavior During Therapy: WFL for tasks assessed/performed Cognition: No apparent impairments                               Following commands: Intact       Cueing  General Comments   Cueing Techniques: Verbal cues;Tactile cues  max hr 118   Exercises     Shoulder Instructions      Home Living Family/patient expects to be discharged to:: Private residence Living Arrangements: Spouse/significant other Available Help at Discharge: Family;Available 24 hours/day Type of Home: House Home Access: Stairs to enter [employer] of Steps: 1 Entrance Stairs-Rails: None Home Layout: Two level;1/2 bath on main level Alternate Level Stairs-Number of Steps: flgiht Alternate Level Stairs-Rails:  (has a chair lift to get to the second floor) Bathroom Shower/Tub: Tub/shower unit;Curtain   Firefighter: Standard     Home Equipment: Agricultural consultant (2 wheels)          Prior Functioning/Environment Prior Level of Function : Independent/Modified Independent              Mobility Comments: rolling walker at times, chair lift to navigate stairs as needed ADLs Comments: indep    OT Problem List: Decreased strength;Decreased activity tolerance;Impaired balance (sitting and/or standing);Decreased safety awareness;Cardiopulmonary status limiting activity   OT Treatment/Interventions: Self-care/ADL training;Therapeutic exercise;Therapeutic activities;Patient/family education;Balance training      OT Goals(Current goals can be found in the care plan section)   Acute Rehab OT Goals Patient Stated Goal: to go home OT Goal Formulation: With patient Time For Goal Achievement: 04/03/24 Potential to Achieve Goals: Good   OT Frequency:  Min 1X/week    Co-evaluation              AM-PAC OT "6 Clicks" Daily Activity     Outcome Measure Help from another person eating meals?: A Little Help from another person taking care of personal grooming?: A Little Help from another person toileting, which includes using toliet, bedpan, or urinal?: A Little Help from another person bathing (including washing, rinsing, drying)?: A Little Help from another person to put on and taking off regular upper body clothing?: A Little Help from another person to put on and taking off regular lower body clothing?: A Little 6 Click Score: 18   End of Session Equipment Utilized During Treatment: Gait belt;Rolling walker (2 wheels) Nurse Communication: Mobility status  Activity Tolerance: Patient tolerated treatment  well Patient left: with call bell/phone within reach;in bed  OT Visit Diagnosis: Unsteadiness on feet (R26.81);Other abnormalities of gait and mobility (R26.89);Muscle weakness (generalized) (M62.81)                Time: 1610-9604 OT Time Calculation (min): 38 min Charges:  OT General Charges $OT Visit: 1 Visit OT Evaluation $OT Eval Low Complexity: 1 Low OT Treatments $Self Care/Home Management : 8-22 mins  Erving Heather OTR/L  Acute Rehab Services  [phone]  office number   Stevphen Elders 03/20/2024, 9:41 AM

## 2024-03-20 NOTE — Progress Notes (Addendum)
 3 Days Post-Op Procedure(s) (LRB): CORONARY ARTERY BYPASS GRAFTING (CABG) TIMES THREE USING LEFT INTERNAL MAMMARY ARTERY AND ENDOSCOPICALLY HARVESTED LEFT GREATER SAPHENOUS VEIN (N/A) ECHOCARDIOGRAM, TRANSESOPHAGEAL, INTRAOPERATIVE (N/A) Subjective: Mrs Mcadow is awake and alert sitting on a recliner eating breakfast. She overall doing well  with no complains of pains. She was ambulated yesterday and states she tolerated fine. She has a BM yesterday and has a good appetite. She denies SOB or chest pain    Objective: Vital signs in last 24 hours: Temp:  [97.6 F (36.4 C)-99 F (37.2 C)] 97.6 F (36.4 C) (05/01 0253) Pulse Rate:  [71-90] 90 (05/01 0253) Cardiac Rhythm: Normal sinus rhythm;Sinus tachycardia (04/30 2300) Resp:  [10-28] 20 (05/01 0550) BP: (106-156)/(48-72) 107/59 (05/01 0253) SpO2:  [90 %-100 %] 93 % (05/01 0253) Weight:  [75.9 kg] 75.9 kg (05/01 0550)  Hemodynamic parameters for last 24 hours:    Intake/Output from previous day: 04/30 0701 - 05/01 0700 In: 240 [P.O.:240] Out: -  Intake/Output this shift: No intake/output data recorded.  General appearance: alert, cooperative, and no distress Neurologic: intact Heart: regular rate and rhythm Lungs: clear to auscultation bilaterally Abdomen: soft, non-tender; bowel sounds normal; no masses,  no organomegaly Extremities: LL bilateral edema  Wound: dry, no signs of infection   Lab Results: Recent Labs    03/19/24 0408 03/20/24 0356  WBC 7.6 7.0  HGB 7.4* 7.3*  HCT 22.2* 22.2*  PLT 93* 111*   BMET:  Recent Labs    03/19/24 0408 03/20/24 0356  NA 137 138  K 3.7 3.8  CL 102 103  CO2 29 27  GLUCOSE 98 95  BUN 8 8  CREATININE 0.44 0.48  CALCIUM  8.7* 8.9    PT/INR:  Recent Labs    03/17/24 1604  LABPROT 22.7*  INR 2.0*   ABG    Component Value Date/Time   PHART 7.356 03/17/2024 2217   HCO3 25.2 03/17/2024 2217   TCO2 26 03/17/2024 2217   ACIDBASEDEF 1.0 03/17/2024 2105   O2SAT 99  03/17/2024 2217   CBG (last 3)  Recent Labs    03/18/24 2336 03/19/24 0331 03/19/24 0806  GLUCAP 124* 101* 132*    Assessment/Plan: S/P Procedure(s) (LRB): CORONARY ARTERY BYPASS GRAFTING (CABG) TIMES THREE USING LEFT INTERNAL MAMMARY ARTERY AND ENDOSCOPICALLY HARVESTED LEFT GREATER SAPHENOUS VEIN (N/A) ECHOCARDIOGRAM, TRANSESOPHAGEAL, INTRAOPERATIVE (N/A)   Cardio:  NSR, BP on the softer side, HR normal holding lopressor   Pulm: On RA, continue to use IS and mobilize GU: +BM, good appetite, continue to use laxatives as needed  Heme: H&H low but stable. Patient hemodynamically stable,  continue to use iron supplements. Monitor trends   Endocrine: hx of hypothyroidism on levothyroxine . Renal: Cr and electrolytes WNL. patient has bilateral LL edema, will diurese with 40mg  IV lasix . Will monitor BP and Cr levels Wound: dry, healing well  Dispo: Diuresing for edema, continue to need PT for recovery. Likely to be d/c on the weekend    LOS: 6 days    Samir Halalou 03/20/2024  Agree Continue PT/OT Diuresing Dispo planning  Brinlee Gambrell O Layia Walla

## 2024-03-20 NOTE — Progress Notes (Signed)
 Pt ambulated in hall 350 feet with front wheel walker, assisted by daughter

## 2024-03-21 MED ORDER — FUROSEMIDE 40 MG PO TABS
40.0000 mg | ORAL_TABLET | Freq: Every day | ORAL | Status: DC
  Administered 2024-03-21 – 2024-03-22 (×2): 40 mg via ORAL
  Filled 2024-03-21 (×2): qty 1

## 2024-03-21 MED ORDER — METOPROLOL TARTRATE 12.5 MG HALF TABLET
12.5000 mg | ORAL_TABLET | Freq: Two times a day (BID) | ORAL | Status: DC
  Administered 2024-03-21 – 2024-03-22 (×3): 12.5 mg via ORAL
  Filled 2024-03-21 (×3): qty 1

## 2024-03-21 NOTE — Progress Notes (Signed)
 Pt ambulated x 470 feet around unit wit one small rest break

## 2024-03-21 NOTE — Progress Notes (Signed)
 Pt ambulated x 470 feet around unit with front wheel walker pt tolerated well but HR increases to 120'130's with ambulation

## 2024-03-21 NOTE — Progress Notes (Signed)
 Occupational Therapy Treatment Patient Details Name: Linda Cardenas MRN: [id number] DOB: [birth year] Today's Date: 03/21/2024   History of present illness Pt is a 68yo female s/p CABGx3 on 4/28. PMH: HLD, hypothyroidism and goiter, chrnoic back pain, HTN, CAD   OT comments  Pt received OOB in chair upon arrival and pleasant and agreeable to therapy. Pt coming to stand with supervision and good adherence to precautions. Ambulating in room with no AD to sink to complete self care/grooming. Pt then walking short distance in hall with RW for longer distances under supervision. HR elevating during ambulation but returning to normal values once seated. BP and HR recorded throughout and listed below. Acute OT to continue to follow to address established goals to facilitate DC to next venue of care.        If plan is discharge home, recommend the following:  A little help with walking and/or transfers;A little help with bathing/dressing/bathroom;Assistance with cooking/housework;Assist for transportation;Help with stairs or ramp for entrance   Equipment Recommendations  BSC/3in1;Tub/shower bench    Recommendations for Other Services      Precautions / Restrictions Precautions Precautions: Sternal Precaution Booklet Issued: No Recall of Precautions/Restrictions: Intact Restrictions Weight Bearing Restrictions Per Provider Order: Yes RUE Weight Bearing Per Provider Order: Non weight bearing LUE Weight Bearing Per Provider Order: Non weight bearing       Mobility Bed Mobility               General bed mobility comments: OOB in chair upon arival and returned to chair    Transfers Overall transfer level: Needs assistance   Transfers: Sit to/from Stand Sit to Stand: Contact guard assist, Supervision           General transfer comment: no cues needed, pt required no physical assist to stand or sit     Balance Overall balance assessment: Needs assistance Sitting-balance support:  Feet supported, No upper extremity supported Sitting balance-Leahy Scale: Fair     Standing balance support: Bilateral upper extremity supported, No upper extremity supported Standing balance-Leahy Scale: Fair Standing balance comment: uses RW for ambulating distances but does not use in room, she is steady on her feet but requires increased time                           ADL either performed or assessed with clinical judgement   ADL Overall ADL's : Needs assistance/impaired     Grooming: Wash/dry face;Oral care;Supervision/safety;Standing Grooming Details (indicate cue type and reason): supervision for safety         Upper Body Dressing : Set up;Standing Upper Body Dressing Details (indicate cue type and reason): to don gown in back                 Functional mobility during ADLs: Supervision/safety;Rolling walker (2 wheels) General ADL Comments: pt supervision for all tasks to ensure safety, while completing tasks and transfers, pt did well adhering to precautions and required no cues for sternal precautions    Extremity/Trunk Assessment              Vision       Perception     Praxis     Communication Communication Communication: Impaired Factors Affecting Communication: Reduced clarity of speech   Cognition Arousal: Alert Behavior During Therapy: WFL for tasks assessed/performed Cognition: No apparent impairments  Following commands: Intact        Cueing   Cueing Techniques: Verbal cues  Exercises      Shoulder Instructions       General Comments HR to 126 with ambulation, came down to 98 once seated. BP 97/86 start of session, after ambulating and returning to recliner BP 128/75(100)    Pertinent Vitals/ Pain       Pain Assessment Pain Assessment: No/denies pain Pain Intervention(s): Monitored during session  Home Living                                           Prior Functioning/Environment              Frequency  Min 1X/week        Progress Toward Goals  OT Goals(current goals can now be found in the care plan section)  Progress towards OT goals: Progressing toward goals  Acute Rehab OT Goals Patient Stated Goal: none staed OT Goal Formulation: With patient Time For Goal Achievement: 04/03/24 Potential to Achieve Goals: Good ADL Goals Pt Will Perform Upper Body Bathing: with modified independence Pt Will Perform Lower Body Bathing: with modified independence;with adaptive equipment Pt Will Perform Upper Body Dressing: with modified independence;with adaptive equipment Pt Will Perform Lower Body Dressing: with modified independence Pt Will Transfer to Toilet: with supervision;ambulating  Plan      Co-evaluation                 AM-PAC OT "6 Clicks" Daily Activity     Outcome Measure   Help from another person eating meals?: A Little Help from another person taking care of personal grooming?: A Little Help from another person toileting, which includes using toliet, bedpan, or urinal?: A Little Help from another person bathing (including washing, rinsing, drying)?: A Little Help from another person to put on and taking off regular upper body clothing?: A Little Help from another person to put on and taking off regular lower body clothing?: A Little 6 Click Score: 18    End of Session Equipment Utilized During Treatment: Gait belt  OT Visit Diagnosis: Unsteadiness on feet (R26.81);Other abnormalities of gait and mobility (R26.89);Muscle weakness (generalized) (M62.81)   Activity Tolerance Patient tolerated treatment well   Patient Left in chair;with call bell/phone within reach   Nurse Communication Mobility status        Time: 1610-9604 OT Time Calculation (min): 27 min  Charges: OT General Charges $OT Visit: 1 Visit OT Treatments $Self Care/Home Management : 8-22 mins $Therapeutic Activity: 8-22  mins  Linda Cardenas, BS, OTA/S   Linda Cardenas 03/21/2024, 10:07 AM

## 2024-03-21 NOTE — Progress Notes (Addendum)
 [address]       [place] [address]             [phone]      4 Days Post-Op Procedure(s) (LRB): CORONARY ARTERY BYPASS GRAFTING (CABG) TIMES THREE USING LEFT INTERNAL MAMMARY ARTERY AND ENDOSCOPICALLY HARVESTED LEFT GREATER SAPHENOUS VEIN (N/A) ECHOCARDIOGRAM, TRANSESOPHAGEAL, INTRAOPERATIVE (N/A)  Subjective: Linda Cardenas is awake, alert and oriented sitting in a recliner. She states she is overall feeling fine. She had 3 episodes of BM yesterday and has a good appetite.  She reports no SOB or chest pain.   Objective: Vital signs in last 24 hours: Temp:  [97.7 F (36.5 C)-98.8 F (37.1 C)] 97.7 F (36.5 C) (05/02 0739) Pulse Rate:  [78-106] 78 (05/02 0739) Cardiac Rhythm: Normal sinus rhythm (05/01 2100) Resp:  [19-24] 24 (05/02 0739) BP: (97-138)/(62-86) 97/86 (05/02 0739) SpO2:  [92 %-98 %] 98 % (05/02 0739) Weight:  [74.1 kg] 74.1 kg (05/02 0259)   Intake/Output from previous day: 05/01 0701 - 05/02 0700 In: 120 [P.O.:120] Out: -   General appearance: alert, cooperative, and no distress Neurologic: intact Heart: regular rate and rhythm Lungs: clear to auscultation bilaterally Abdomen: soft, non-tender; bowel sounds normal; no masses,  no organomegaly Extremities: Bilateral trace edema  Wound: Dry, no signs of infections  Lab Results: Recent Labs    03/19/24 0408 03/20/24 0356  WBC 7.6 7.0  HGB 7.4* 7.3*  HCT 22.2* 22.2*  PLT 93* 111*   BMET:  Recent Labs    03/19/24 0408 03/20/24 0356  NA 137 138  K 3.7 3.8  CL 102 103  CO2 29 27  GLUCOSE 98 95  BUN 8 8  CREATININE 0.44 0.48  CALCIUM  8.7* 8.9    PT/INR: No results for input(s): "LABPROT", "INR" in the last 72 hours. ABG    Component Value Date/Time   PHART 7.356 03/17/2024 2217   HCO3 25.2 03/17/2024 2217   TCO2 26 03/17/2024 2217   ACIDBASEDEF 1.0 03/17/2024 2105   O2SAT 99 03/17/2024 2217   CBG (last 3)  Recent Labs    03/18/24 2336 03/19/24 0331  03/19/24 0806  GLUCAP 124* 101* 132*    Assessment/Plan: S/P Procedure(s) (LRB): CORONARY ARTERY BYPASS GRAFTING (CABG) TIMES THREE USING LEFT INTERNAL MAMMARY ARTERY AND ENDOSCOPICALLY HARVESTED LEFT GREATER SAPHENOUS VEIN (N/A) ECHOCARDIOGRAM, TRANSESOPHAGEAL, INTRAOPERATIVE (N/A)  Cardio: NSR, she had some elevated HR in the 120s when being walked in the unit. BP soft in the am but elevated at night. Will start her on lopressor  12.5mg  at night with parameters in place. Pulm: she is on RA, continue to use IS and mobilize GI: +BM, tolerating diet well. Continue laxatives as needed  Heme: H&H low but stable, continue to use iron supplements and trend  Renal: Cr normal  and other electrolytes normal. May benefit from another round of 40mg  of IV lasix  today. Will monitor BP levels  Wound: dry and healing well Dispo: she is recovering well. Likely to d/c tomorrow      LOS: 7 days    [facility] 03/21/2024  Agree with above documentation.  Overall patient looks good today.  She responded well to IV Lasix .  Will start oral tablet  Her heart rate and blood pressure are now elevated.  We will resume Lopressor  at 12.5 mg BID and titrate as she tolerates.  Home health orders have been placed... Anticipate discharge in next 24-48 hours  Gates Kasal, PA-C 8:49 AM 03/21/24  Agree Dispo planning  Hilarie Lovely

## 2024-03-21 NOTE — Progress Notes (Signed)
 Physical Therapy Treatment Patient Details Name: Linda Cardenas MRN: [id number] DOB: [birth year] Today's Date: 03/21/2024   History of Present Illness Pt is a 67yo female s/p CABGx3 on 4/28. PMH: HLD, hypothyroidism and goiter, chrnoic back pain, HTN, CAD    PT Comments  Pt sitting in chair on arrival, agreeable to therapy session despite feeling nauseous. Pt is continuing to make progress towards acute goals. Pt with increased gait distance this session with RW support and CGA for safety, with HR stable throughout. Pt without increased c/o of nausea throughout session. Pt with good implementation of sternal precautions, with pt placing hands on knees for sit<>stand transfers with pt also able to verbalize them. Educated pt on pacing, continued RW and spirometer use after discharge with pt verbalizing understanding. Pt continues to benefit from skilled PT services to progress toward functional mobility goals.      If plan is discharge home, recommend the following: A little help with walking and/or transfers;A little help with bathing/dressing/bathroom;Assist for transportation;Help with stairs or ramp for entrance   Can travel by private vehicle        Equipment Recommendations  BSC/3in1    Recommendations for Other Services       Precautions / Restrictions Precautions Precautions: Sternal Precaution Booklet Issued: No Recall of Precautions/Restrictions: Intact     Mobility  Bed Mobility Overal bed mobility: Needs Assistance             General bed mobility comments: OOB in chair upon arival and returned to chair    Transfers Overall transfer level: Needs assistance Equipment used: Rolling walker (2 wheels), None Transfers: Sit to/from Stand Sit to Stand: Contact guard assist, Supervision           General transfer comment: CGA for saftey, pt with good hand placement on knees with rise and pt able to return to sitting in chair with close CGA and no AD     Ambulation/Gait Ambulation/Gait assistance: Contact guard assist Gait Distance (Feet): 235 Feet Assistive device: Rolling walker (2 wheels), None Gait Pattern/deviations: Step-through pattern, Decreased stride length Gait velocity: dec     General Gait Details: CGA for saftey during ambulation with RW, pt able to come around foot of bed at end of ambulation without AD with close CGA with no LOB or unsteadiness   Stairs             Wheelchair Mobility     Tilt Bed    Modified Rankin (Stroke Patients Only)       Balance Overall balance assessment: Needs assistance Sitting-balance support: Feet supported, No upper extremity supported Sitting balance-Leahy Scale: Fair     Standing balance support: Bilateral upper extremity supported, No upper extremity supported Standing balance-Leahy Scale: Fair Standing balance comment: uses RW for ambulating distances but is able to walk in room without AD, she is steady on her feet but requires increased time                            Communication Communication Communication: Impaired Factors Affecting Communication: Reduced clarity of speech  Cognition Arousal: Alert Behavior During Therapy: WFL for tasks assessed/performed   PT - Cognitive impairments: No apparent impairments                         Following commands: Intact      Cueing Cueing Techniques: Verbal cues  Exercises      General  Comments General comments (skin integrity, edema, etc.): VSS on RA      Pertinent Vitals/Pain Pain Assessment Pain Assessment: No/denies pain Pain Intervention(s): Monitored during session    Home Living                          Prior Function            PT Goals (current goals can now be found in the care plan section) Acute Rehab PT Goals PT Goal Formulation: With patient/family Time For Goal Achievement: 04/02/24 Progress towards PT goals: Progressing toward goals     Frequency    Min 2X/week      PT Plan      Co-evaluation              AM-PAC PT "6 Clicks" Mobility   Outcome Measure  Help needed turning from your back to your side while in a flat bed without using bedrails?: A Little Help needed moving from lying on your back to sitting on the side of a flat bed without using bedrails?: A Little Help needed moving to and from a bed to a chair (including a wheelchair)?: A Little Help needed standing up from a chair using your arms (e.g., wheelchair or bedside chair)?: A Little Help needed to walk in hospital room?: A Little Help needed climbing 3-5 steps with a railing? : A Lot 6 Click Score: 17    End of Session Equipment Utilized During Treatment: Gait belt Activity Tolerance: Patient tolerated treatment well Patient left: in chair;with call bell/phone within reach;with family/visitor present Nurse Communication: Mobility status;Other (comment) (Nausea) PT Visit Diagnosis: Unsteadiness on feet (R26.81)     Time: 1610-9604 PT Time Calculation (min) (ACUTE ONLY): 17 min  Charges:    $Gait Training: 8-22 mins PT General Charges $$ ACUTE PT VISIT: 1 Visit                     Forrest Iha 03/21/2024, 1:49 PM

## 2024-03-21 NOTE — Progress Notes (Signed)
 03/21/24 1400  Spiritual Encounters  Type of Visit Follow up  Care provided to: Pt and family  Reason for visit Advance directives    Chaplain made follow up visit. Chaplain responded to a consult request for Advance Directive education. Patient's sister was at the bedside.   Chaplain provided the Advance Directive packet as well as education on Advance Directives-documents an individual completes to communicate their health care directions in advance of a time when they may need them. Chaplain informed pt the documents which may be completed here in the hospital are the Living Will and Health Care Power of [place].  Chaplain informed that the Health Care Power of [place] is a legal document in which an individual names another person, their Health Care Agent, to make health care decisions when the individual is not able to make them for themselves. The Health Care Agent's function can be temporary or permanent depending on the pt's ability to make and communicate those decisions independently. Chaplain informed pt in the absence of a Health Care Power of Attorney, the state of [place]  directs health care providers to look to the following individuals in the order listed: legal guardian; an attorney?in?fact under a general power of attorney (POA) if that POA includes the right to make health care decisions; a husband or wife; a majority of parents and adult children; a majority of adult brothers and sisters; or an individual who has an established relationship with you, who is acting in good faith and who can convey your wishes.  If none of these person are available or willing to make medical decisions on a patient's behalf, the law allows the patient's doctor to make decisions for them as long as another doctor agrees with those decisions.  Chaplain also informed the patient that the Health Care agent has no decision-making authority over any affairs other than those related to his or her  medical care.  The chaplain further educated the pt that a Living Will is a legal document that allows an individual to state his or her desire not to receive life-prolonging measures in the event that they have a condition that is incurable and will result in their death in a short period of time; they are unconscious, and doctors are confident that they will not regain consciousness; and/or they have advanced dementia or other substantial and irreversible loss of mental function. The chaplain informed pt that life-prolonging measures are medical treatments that would only serve to postpone death, including breathing machines, kidney dialysis, antibiotics, artificial nutrition and hydration (tube feeding), and similar forms of treatment and that if an individual is able to express their wishes, they may also make them known without the use of a Living Will, but in the event that an individual is not able to express their wishes themselves, a Living Will allows medical providers and the pt's family and friends ensure that they are not making decisions on the pt's behalf, but rather serving as the pt's voice to convey decisions the pt has already made.  The patient is aware that the decision to create an advance directive is theirs alone and they may chose not to complete the documents or may chose to complete one portion or both.  The patient was informed that they can revoke the documents at any time by striking through them and writing void or by completing new documents, but that it is also advisable that the individual verbally notify interested parties that their wishes have changed.  They are also aware that the document must be signed in the presence of a notary public and two witnesses and that this can be done while the patient is still admitted to the hospital or after discharge in the community. If they decide to complete Advance Directives after being discharged from the hospital, they have been  advised to notify all interested parties and to provide those documents to their physicians and loved ones in addition to bringing them to the hospital in the event of another hospitalization.  The chaplain informed the pt that if they desire to proceed with completing Advance Directive Documentation while they are still admitted, notary services are typically available at [facility] between the hours of 1:00 and 3:30 Monday-Thursday.    When the patient is ready to have these documents completed, the patient should request that their nurse place a spiritual care consult and indicate that the patient is ready to have their advance directives notarized so that arrangements for witnesses and notary public can be made.  Please page spiritual care if the patient desires further education or has questions.     M.Kubra Welby Hale Resident [phone]

## 2024-03-21 NOTE — Progress Notes (Addendum)
 Pt sitting up in chair, feeling well today. Ambulated twice this morning in hallway with RN, tolerated well. Post CABG education including sternal precautions, restrictions, exercise guidelines, heart healthy diet, wound care, and CRP2 reviewed. All questions and concerns addressed. Will refer to [facility] for CRP2. Will continue to follow.   [id number] Cindra Cree, RRT 03/21/24 [phone]

## 2024-03-22 MED ORDER — METOPROLOL TARTRATE 25 MG PO TABS: 12.5000 mg | ORAL_TABLET | Freq: Two times a day (BID) | ORAL | 3 refills | Status: DC

## 2024-03-22 MED ORDER — TRAMADOL HCL 50 MG PO TABS: 50.0000 mg | ORAL_TABLET | ORAL | 0 refills | Status: AC | PRN

## 2024-03-22 MED ORDER — METHOCARBAMOL 500 MG PO TABS: 250.0000 mg | ORAL_TABLET | Freq: Three times a day (TID) | ORAL | 0 refills | Status: AC | PRN

## 2024-03-22 MED ORDER — FUROSEMIDE 40 MG PO TABS: 40.0000 mg | ORAL_TABLET | Freq: Every day | ORAL | 0 refills | Status: DC

## 2024-03-22 MED ORDER — ACETAMINOPHEN 500 MG PO TABS
500.0000 mg | ORAL_TABLET | Freq: Four times a day (QID) | ORAL | Status: AC | PRN
Start: 2024-03-22 — End: ?

## 2024-03-22 MED ORDER — CLOPIDOGREL BISULFATE 75 MG PO TABS: 75.0000 mg | ORAL_TABLET | Freq: Every day | ORAL | 3 refills | Status: DC

## 2024-03-22 MED ORDER — POTASSIUM CHLORIDE CRYS ER 20 MEQ PO TBCR: 20.0000 meq | EXTENDED_RELEASE_TABLET | Freq: Every day | ORAL | 0 refills | Status: DC

## 2024-03-22 MED ORDER — ONDANSETRON HCL 4 MG PO TABS: 4.0000 mg | ORAL_TABLET | Freq: Three times a day (TID) | ORAL | 0 refills | Status: DC | PRN

## 2024-03-22 MED ORDER — FERROUS SULFATE 325 (65 FE) MG PO TABS: 325.0000 mg | ORAL_TABLET | Freq: Every day | ORAL | 0 refills | Status: AC

## 2024-03-22 NOTE — Progress Notes (Addendum)
      [address]       [place] [address]             [phone]       5 Days Post-Op Procedure(s) (LRB): CORONARY ARTERY BYPASS GRAFTING (CABG) TIMES THREE USING LEFT INTERNAL MAMMARY ARTERY AND ENDOSCOPICALLY HARVESTED LEFT GREATER SAPHENOUS VEIN (N/A) ECHOCARDIOGRAM, TRANSESOPHAGEAL, INTRAOPERATIVE (N/A)  Subjective:  Patient looks great.  Sitting up in chair, states she is feeling so much better.  She ambulated 4x yesterday about 500 ft each time.Aaron Aas + BM  Objective: Vital signs in last 24 hours: Temp:  [97.6 F (36.4 C)-98.7 F (37.1 C)] 97.6 F (36.4 C) (05/03 0802) Pulse Rate:  [65-86] 67 (05/03 0802) Cardiac Rhythm: Normal sinus rhythm (05/02 2100) Resp:  [15-21] 15 (05/03 0802) BP: (110-149)/(57-86) 110/68 (05/03 0802) SpO2:  [92 %-99 %] 96 % (05/03 0802) Weight:  [73.2 kg] 73.2 kg (05/03 0625)  Intake/Output from previous day: 05/02 0701 - 05/03 0700 In: 120 [P.O.:120] Out: -   General appearance: alert, cooperative, and no distress Heart: regular rate and rhythm Lungs: clear to auscultation bilaterally Abdomen: soft, non-tender; bowel sounds normal; no masses,  no organomegaly Extremities: edema trace Wound: clean and dry  Lab Results: Recent Labs    03/20/24 0356  WBC 7.0  HGB 7.3*  HCT 22.2*  PLT 111*   BMET:  Recent Labs    03/20/24 0356  NA 138  K 3.8  CL 103  CO2 27  GLUCOSE 95  BUN 8  CREATININE 0.48  CALCIUM  8.9    PT/INR: No results for input(s): "LABPROT", "INR" in the last 72 hours. ABG    Component Value Date/Time   PHART 7.356 03/17/2024 2217   HCO3 25.2 03/17/2024 2217   TCO2 26 03/17/2024 2217   ACIDBASEDEF 1.0 03/17/2024 2105   O2SAT 99 03/17/2024 2217   CBG (last 3)  No results for input(s): "GLUCAP" in the last 72 hours.  Assessment/Plan: S/P Procedure(s) (LRB): CORONARY ARTERY BYPASS GRAFTING (CABG) TIMES THREE USING LEFT INTERNAL MAMMARY ARTERY AND ENDOSCOPICALLY HARVESTED LEFT GREATER  SAPHENOUS VEIN (N/A) ECHOCARDIOGRAM, TRANSESOPHAGEAL, INTRAOPERATIVE (N/A)  CV- NSR, BP improved- continue Lopressor  at 12.5 mg BID Pulm- no acute issues, off oxygen, continue IS Renal- creatinine has been stable, weight is trending down, continue lasix , potassium x 7 days Anemia- post operative blood loss, required 1 unit during stay, currently on iron Hypothyroidism- continue synthroid  Dispo- patient stable, will d/c home today  LOS: 8 days   Gates Kasal, PA-C 03/22/2024  Agree with above. Doing well and plan home today.

## 2024-03-22 NOTE — Progress Notes (Addendum)
 Chest tube sutures removed, site unremarkable, steri strips applied.Patient verbalized understanding of dc instructions. All paperwork and belongings given to patient.

## 2024-03-22 NOTE — Progress Notes (Signed)
 CARDIAC REHAB PHASE I   Postop [facility] education completed. Referral for CRP2 sent to [facility]. Plan for discharge home today.   Ronny Colas, RN BSN 03/22/2024 9:00 AM

## 2024-03-24 DIAGNOSIS — Z48812 Encounter for surgical aftercare following surgery on the circulatory system: Secondary | ICD-10-CM | POA: Diagnosis not present

## 2024-04-02 ENCOUNTER — Encounter (HOSPITAL_COMMUNITY): Payer: Self-pay

## 2024-04-04 ENCOUNTER — Telehealth (HOSPITAL_COMMUNITY): Payer: Self-pay

## 2024-04-04 ENCOUNTER — Ambulatory Visit
Payer: Self-pay | Attending: Thoracic Surgery (Cardiothoracic Vascular Surgery) | Admitting: Thoracic Surgery (Cardiothoracic Vascular Surgery)

## 2024-04-04 DIAGNOSIS — Z951 Presence of aortocoronary bypass graft: Secondary | ICD-10-CM

## 2024-04-04 NOTE — Telephone Encounter (Signed)
 Pt's daughter called and stated that she would like to receive a call when its time for pt to schedule for cardiac rehab because she is the one who has to bring her. Daughters phone number is [phone].

## 2024-04-04 NOTE — Progress Notes (Signed)
     [address]       [name] [address]             [phone]       Patient: Home Provider: Office Consent for Telemedicine visit obtained.  Today's visit was completed via a real-time telehealth (see specific modality noted below). The patient/authorized person provided oral consent at the time of the visit to engage in a telemedicine encounter with the present provider at [facility]. The patient/authorized person was informed of the potential benefits, limitations, and risks of telemedicine. The patient/authorized person expressed understanding that the laws that protect confidentiality also apply to telemedicine. The patient/authorized person acknowledged understanding that telemedicine does not provide emergency services and that he or she would need to call 911 or proceed to the nearest hospital for help if such a need arose.   Total time spent in the clinical discussion 10 minutes.  Telehealth Modality: Phone visit (audio only)  I had a telephone visit with  [name] who is s/p CABG.  Overall doing well.  She had 1 episode of palpitation, and bradycardia.  She is only taking metop 1 a day.  Pain is minimal.  Ambulating well. Vitals have been stable.  [name] will see us  back in 1 month with a chest x-ray for cardiac rehab clearance.  Mikia Delaluz [place]

## 2024-04-08 ENCOUNTER — Ambulatory Visit: Attending: Nurse Practitioner | Admitting: Nurse Practitioner

## 2024-04-08 VITALS — BP 116/76 | HR 70 | Ht <= 58 in | Wt 163.8 lb

## 2024-04-08 DIAGNOSIS — R0609 Other forms of dyspnea: Secondary | ICD-10-CM

## 2024-04-08 DIAGNOSIS — E039 Hypothyroidism, unspecified: Secondary | ICD-10-CM

## 2024-04-08 DIAGNOSIS — I2510 Atherosclerotic heart disease of native coronary artery without angina pectoris: Secondary | ICD-10-CM

## 2024-04-08 DIAGNOSIS — Z951 Presence of aortocoronary bypass graft: Secondary | ICD-10-CM | POA: Diagnosis not present

## 2024-04-08 DIAGNOSIS — E785 Hyperlipidemia, unspecified: Secondary | ICD-10-CM

## 2024-04-08 DIAGNOSIS — R7303 Prediabetes: Secondary | ICD-10-CM

## 2024-04-08 DIAGNOSIS — I10 Essential (primary) hypertension: Secondary | ICD-10-CM | POA: Diagnosis not present

## 2024-04-08 MED ORDER — METOPROLOL SUCCINATE ER 25 MG PO TB24: 12.5000 mg | ORAL_TABLET | Freq: Every day | ORAL | 3 refills | Status: DC

## 2024-04-08 MED ORDER — FUROSEMIDE 40 MG PO TABS: 40.0000 mg | ORAL_TABLET | Freq: Every day | ORAL | 3 refills | Status: DC | PRN

## 2024-04-08 NOTE — Progress Notes (Signed)
 Office Visit    Patient Name: Linda Cardenas Date of Encounter: 04/08/2024  Primary Care Provider:  Mordechai April, DO Primary Cardiologist:  Knox Perl, MD  Chief Complaint    68 year old female with a history of CAD s/p CABG x 3 (LIMA-LAD, SVG-OM, SVG-diagonal) in 02/2024, hypertension, hyperlipidemia, hypothyroidism, prediabetes, arthritis, and GERD who presents for hospital follow-up related to CAD s/p CABG x 3.  Past Medical History    Past Medical History:  Diagnosis Date   Arthritis    Back pain     CHRONIC LOW BACK PAIN - GETS INJECTIONS IN BACK   Constipation    Eczema    Elevated liver enzymes    Gallstones    GERD (gastroesophageal reflux disease)    Hyperlipidemia    Hypothyroidism    Lactose intolerance    Shortness of breath    Thyroid  disease    Past Surgical History:  Procedure Laterality Date   BREAST SURGERY     "PIMPLE" REMOVED FROM BREAST IN OFFICE   CESAREAN SECTION     CHOLECYSTECTOMY  12/26/2012   Procedure: LAPAROSCOPIC CHOLECYSTECTOMY WITH INTRAOPERATIVE CHOLANGIOGRAM;  Surgeon: Keitha Pata, MD;  Location: [facility];  Service: General;  Laterality: N/A;   CORONARY ARTERY BYPASS GRAFT N/A 03/17/2024   Procedure: CORONARY ARTERY BYPASS GRAFTING (CABG) TIMES THREE USING LEFT INTERNAL MAMMARY ARTERY AND ENDOSCOPICALLY HARVESTED LEFT GREATER SAPHENOUS VEIN;  Surgeon: Hilarie Lovely, MD;  Location: [facility];  Service: Open Heart Surgery;  Laterality: N/A;   INTRAOPERATIVE TRANSESOPHAGEAL ECHOCARDIOGRAM N/A 03/17/2024   Procedure: ECHOCARDIOGRAM, TRANSESOPHAGEAL, INTRAOPERATIVE;  Surgeon: Hilarie Lovely, MD;  Location: [facility] OR;  Service: Open Heart Surgery;  Laterality: N/A;   LEFT HEART CATH AND CORONARY ANGIOGRAPHY N/A 03/14/2024   Procedure: LEFT HEART CATH AND CORONARY ANGIOGRAPHY;  Surgeon: Knox Perl, MD;  Location: [facility];  Service: Cardiovascular;  Laterality: N/A;   LUMBAR FUSION N/A L4/5 04/2013    Allergies  Allergies  Allergen  Reactions   Beef-Derived Drug Products     Religion    Pork-Derived Products     religion     Labs/Other Studies Reviewed    The following studies were reviewed today:  Cardiac Studies & Procedures   ______________________________________________________________________________________________ CARDIAC CATHETERIZATION  CARDIAC CATHETERIZATION 03/14/2024  Conclusion Images from the original result were not included. Cardiac Catheterization 03/14/24: Hemodynamic data: LVEDP 19 mmHg, moderately elevated.  No pressure gradient across the aortic valve.  Angiographic data: LM: Moderately calcified coronary arteries.  Distal left main is at least 70% narrowed and extends into CX and proximal LAD. LAD: Moderate coronary calcification of proximal and mid LAD, ostial and proximal LAD 20 to 30% stenosed.  Large D1 with ostial 99% stenosis. Cx: Large-caliber vessel, large OM 3, ostial CX 50% stenosed and calcific. RCA: Dominant.  Mild disease in the proximal segment, PL branch has a proximal ostial 40% stenosis.    Impression and recommendations: Significant left main disease, patient needs consultation for CABG.  Findings Coronary Findings Diagnostic  Dominance: Right  Left Main Mid LM to Dist LM lesion is 70% stenosed. Dist LM to Prox LAD lesion is 30% stenosed.  Left Anterior Descending  First Diagonal Branch 1st Diag lesion is 99% stenosed.  Left Circumflex Ost Cx lesion is 50% stenosed.  Right Coronary Artery Prox RCA lesion is 20% stenosed.  Right Posterior Atrioventricular Artery RPAV lesion is 40% stenosed.  Intervention  No interventions have been documented.   STRESS TESTS  PCV MYOCARDIAL PERFUSION  WITH LEXISCAN  01/20/2019  Narrative Lexiscan  Sestamibi stress test 01/17/2019: 1. Lexiscan  stress test  with low level exercise was performed. Exercise capacity was not assessed. Patient reached HR of 135 bpm which was 85% of the maximum predicted heart rate.  No stress symptoms reported. Normal hemodynamic response seen. The stress electrocardiogram with low level exercise showed sinus tachycardia, normal stress conduction, no stress arrhythmias, <1 mm upsloping ST depression in inferolateral leads. These changes are not suggestive of ischemia. 2. The overall quality of the study is excellent. There is no evidence of abnormal lung activity. Stress and rest SPECT images demonstrate homogeneous tracer distribution throughout the myocardium. Gated SPECT imaging reveals normal myocardial thickening and wall motion. The left ventricular ejection fraction was normal (67%). 3. Low risk study.   ECHOCARDIOGRAM  ECHOCARDIOGRAM COMPLETE 03/14/2024  Narrative ECHOCARDIOGRAM REPORT    Patient Name:   [name] Date of Exam: 03/14/2024 Medical Rec #:  [id number]    Height:       58.0 in Accession #:    [id number]   Weight:       164.0 lb Date of Birth:  02/25/1956    BSA:          1.674 m Patient Age:    67 years     BP:           158/68 mmHg Patient Gender: F            HR:           47 bpm. Exam Location:  Inpatient  Procedure: 2D Echo, Cardiac Doppler, Color Doppler and Intracardiac Opacification Agent (Both Spectral and Color Flow Doppler were utilized during procedure).  Indications:    NSTEMI I21.4  History:        Patient has no prior history of Echocardiogram examinations. Signs/Symptoms:Shortness of Breath.  Sonographer:    Astrid Blamer Referring Phys: [id number] ROBIN FERNANDES  IMPRESSIONS   1. Left ventricular ejection fraction, by estimation, is 60 to 65%. The left ventricle has normal function. The left ventricle has no regional wall motion abnormalities. Left ventricular diastolic parameters are indeterminate. 2. Right ventricular systolic function is normal. The right ventricular size is normal. 3. Left atrial size was moderately dilated. 4. The mitral valve is normal in structure. Trivial mitral valve regurgitation. No evidence of  mitral stenosis. 5. The aortic valve is tricuspid. There is mild calcification of the aortic valve. Aortic valve regurgitation is not visualized. No aortic stenosis is present. 6. The inferior vena cava is normal in size with greater than 50% respiratory variability, suggesting right atrial pressure of 3 mmHg.  FINDINGS Left Ventricle: Left ventricular ejection fraction, by estimation, is 60 to 65%. The left ventricle has normal function. The left ventricle has no regional wall motion abnormalities. Definity  contrast agent was given IV to delineate the left ventricular endocardial borders. The left ventricular internal cavity size was normal in size. There is no left ventricular hypertrophy. Left ventricular diastolic parameters are indeterminate.  Right Ventricle: The right ventricular size is normal. No increase in right ventricular wall thickness. Right ventricular systolic function is normal.  Left Atrium: Left atrial size was moderately dilated.  Right Atrium: Right atrial size was normal in size.  Pericardium: There is no evidence of pericardial effusion.  Mitral Valve: The mitral valve is normal in structure. Mild mitral annular calcification. Trivial mitral valve regurgitation. No evidence of mitral valve stenosis.  Tricuspid Valve: The tricuspid valve is normal in structure. Tricuspid valve regurgitation is trivial.  No evidence of tricuspid stenosis.  Aortic Valve: The aortic valve is tricuspid. There is mild calcification of the aortic valve. Aortic valve regurgitation is not visualized. No aortic stenosis is present. Aortic valve mean gradient measures 3.0 mmHg. Aortic valve peak gradient measures 6.5 mmHg. Aortic valve area, by VTI measures 2.29 cm.  Pulmonic Valve: The pulmonic valve was normal in structure. Pulmonic valve regurgitation is trivial. No evidence of pulmonic stenosis.  Aorta: The aortic root is normal in size and structure.  Venous: The inferior vena cava is  normal in size with greater than 50% respiratory variability, suggesting right atrial pressure of 3 mmHg.  IAS/Shunts: No atrial level shunt detected by color flow Doppler.   LEFT VENTRICLE PLAX 2D LVIDd:         4.10 cm   Diastology LVIDs:         2.80 cm   LV e' medial:    7.18 cm/s LV PW:         1.00 cm   LV E/e' medial:  13.9 LV IVS:        0.90 cm   LV e' lateral:   8.49 cm/s LVOT diam:     1.80 cm   LV E/e' lateral: 11.8 LV SV:         78 LV SV Index:   47 LVOT Area:     2.54 cm   RIGHT VENTRICLE RV S prime:     10.10 cm/s TAPSE (M-mode): 1.8 cm  LEFT ATRIUM             Index        RIGHT ATRIUM          Index LA Vol (A2C):   80.0 ml 47.79 ml/m  RA Area:     9.64 cm LA Vol (A4C):   38.0 ml 22.70 ml/m  RA Volume:   18.20 ml 10.87 ml/m LA Biplane Vol: 59.4 ml 35.49 ml/m AORTIC VALVE AV Area (Vmax):    2.08 cm AV Area (Vmean):   2.34 cm AV Area (VTI):     2.29 cm AV Vmax:           127.00 cm/s AV Vmean:          86.400 cm/s AV VTI:            0.340 m AV Peak Grad:      6.5 mmHg AV Mean Grad:      3.0 mmHg LVOT Vmax:         104.00 cm/s LVOT Vmean:        79.300 cm/s LVOT VTI:          0.306 m LVOT/AV VTI ratio: 0.90  AORTA Ao Root diam: 2.90 cm  MITRAL VALVE               TRICUSPID VALVE MV Area (PHT): 3.66 cm    TR Peak grad:   11.7 mmHg MV Decel Time: 207 msec    TR Vmax:        171.00 cm/s MV E velocity: 99.90 cm/s MV A velocity: 92.20 cm/s  SHUNTS MV E/A ratio:  1.08        Systemic VTI:  0.31 m Systemic Diam: 1.80 cm  Jules Oar MD Electronically signed by Jules Oar MD Signature Date/Time: 03/14/2024/1:20:25 PM    Final   TEE  ECHO INTRAOPERATIVE TEE 03/17/2024  Narrative *INTRAOPERATIVE TRANSESOPHAGEAL REPORT *    Patient Name:   ZABDI MIS   Date of  Exam: 03/17/2024 Medical Rec #:  [id number]      Height:       58.0 in Accession #:    [id number]     Weight:       164.0 lb Date of Birth:  [birth year]      BSA:           1.67 m Patient Age:    67 years       BP:           142/71 mmHg Patient Gender: F              HR:           51 bpm. Exam Location:  Anesthesiology  Transesophogeal exam was perform intraoperatively during surgical procedure. Patient was closely monitored under general anesthesia during the entirety of examination.  Indications:     CAD Performing Phys: Raymondo Calin, MD  Complications: No known complications during this procedure. POST-OP IMPRESSIONS Overall, there were no significant changes from pre-bypass. _ Left Ventricle: The left ventricle is unchanged from pre-bypass. _ Right Ventricle: The right ventricle appears unchanged from pre-bypass. _ Aorta: The aorta appears unchanged from pre-bypass. _ Left Atrium: The left atrium appears unchanged from pre-bypass. _ Left Atrial Appendage: The left atrial appendage appears unchanged from pre-bypass. _ Aortic Valve: The aortic valve appears unchanged from pre-bypass. _ Mitral Valve: The mitral valve appears unchanged from pre-bypass. _ Tricuspid Valve: The tricuspid valve appears unchanged from pre-bypass. _ Pulmonic Valve: The pulmonic valve appears unchanged from pre-bypass. _ Interatrial Septum: The interatrial septum appears unchanged from pre-bypass. _ Interventricular Septum: The interventricular septum appears unchanged from pre-bypass. _ Pericardium: The pericardium appears unchanged from pre-bypass.  PRE-OP FINDINGS Left Ventricle: The left ventricle has normal systolic function, with an ejection fraction of 60-65%. The cavity size was normal. There is moderately increased left ventricular wall thickness. There is moderate concentric left ventricular hypertrophy.  Right Ventricle: The right ventricle has normal systolic function. The cavity was normal. There is no increase in right ventricular wall thickness.  Left Atrium: Left atrial size was normal in size. No left atrial/left atrial appendage thrombus was  detected.  Right Atrium: Right atrial size was normal in size.  Interatrial Septum: No atrial level shunt detected by color flow Doppler.  Pericardium: There is no evidence of pericardial effusion.  Mitral Valve: The mitral valve is normal in structure. Mitral valve regurgitation is trivial by color flow Doppler.  Tricuspid Valve: The tricuspid valve was normal in structure. Tricuspid valve regurgitation was not visualized by color flow Doppler.  Aortic Valve: The aortic valve is normal in structure. Aortic valve regurgitation was not visualized by color flow Doppler.  Pulmonic Valve: The pulmonic valve was normal in structure. Pulmonic valve regurgitation is not visualized by color flow Doppler.   Aorta: The ascending aorta, aortic root and aortic arch are normal in size and structure.   Raymondo Calin MD Electronically signed by Raymondo Calin MD Signature Date/Time: 03/17/2024/3:39:25 PM    Final        ______________________________________________________________________________________________     Recent Labs: 03/14/2024: ALT 24; TSH 0.953 03/18/2024: Magnesium  2.0 04/08/2024: BNP 59.4; BUN 7; Creatinine, Ser 0.60; Hemoglobin 11.0; Platelets 373; Potassium 5.0; Sodium 140  Recent Lipid Panel    Component Value Date/Time   CHOL 145 03/14/2024 0443   TRIG 48 03/14/2024 0443   HDL 68 03/14/2024 0443   CHOLHDL 2.1 03/14/2024 0443   VLDL 10 03/14/2024 0443   LDLCALC 67 03/14/2024  1610    History of Present Illness    68 year old female with the above past medical history including CAD s/p CABG x 3 (LIMA-LAD, SVG-OM, SVG-diagonal) in 02/2024, hypertension, hyperlipidemia, hypothyroidism, prediabetes, arthritis, and GERD.  She was previously evaluated by Dr. Berry Bristol, last seen in 2020.  Exercise Myoview in 2020 was low risk, no evidence of ischemia.  Echocardiogram in 2020 showed normal LV function, mild concentric LVH, G1 DD, mild mitral valve regurgitation.  He was  hospitalized in April 2025 in the setting of NSTEMI.  Cardiology was consulted.  Cardiac catheterization revealed moderately calcified coronary arteries with significant (at least 70%) left main disease extending into the circumflex and proximal LAD, otherwise mild CAD.  CT surgery was consulted and she underwent CABG x 3 on 03/17/2024.  Postop course was unremarkable. Echocardiogram showed EF 60 to 65%, normal LV function, no RWMA, normal RV systolic function, no significant valvular abnormalities. She was discharged home in stable condition on 04/18/2024.  She presents today for follow-up accompanied by her daughter. Since her hospitalization she has been stable from a cardiac standpoint.  She has noted some ongoing dyspnea on exertion, mild orthopnea.  She has had 1 episode of palpitations since her hospital discharge, lasting 1 to 2 minutes, no associated symptoms. She reports some mild incisional discomfort, she denies chest pain, dizziness, edema, PND, or weight gain.    Home Medications    Current Outpatient Medications  Medication Sig Dispense Refill   acetaminophen  (TYLENOL ) 500 MG tablet Take 1-2 tablets (500-1,000 mg total) by mouth every 6 (six) hours as needed.     aspirin  81 MG tablet Take 81 mg by mouth daily.     calcium  carbonate (OS-CAL) 600 MG TABS Take 600 mg by mouth daily.     Cholecalciferol (VITAMIN D) 2000 UNITS tablet Take 2,000 Units by mouth daily.     clopidogrel  (PLAVIX ) 75 MG tablet Take 1 tablet (75 mg total) by mouth daily. 30 tablet 3   ferrous sulfate  325 (65 FE) MG tablet Take 1 tablet (325 mg total) by mouth daily with breakfast. 30 tablet 0   fluticasone  furoate-vilanterol (BREO ELLIPTA ) 100-25 MCG/ACT AEPB Inhale 1 puff into the lungs daily. (Patient taking differently: Inhale 1 puff into the lungs daily as needed (for wheezing and shortness of breath).) 1 each 5   levothyroxine  (SYNTHROID ) 100 MCG tablet Take 100 mcg by mouth daily before breakfast.      methocarbamol  (ROBAXIN ) 500 MG tablet Take 0.5 tablets (250 mg total) by mouth every 8 (eight) hours as needed for muscle spasms. 15 tablet 0   metoprolol  succinate (TOPROL  XL) 25 MG 24 hr tablet Take 0.5 tablets (12.5 mg total) by mouth daily. 45 tablet 3   Multiple Vitamins-Minerals (MULTIVITAMIN WITH MINERALS) tablet Take 1 tablet by mouth daily.     ondansetron  (ZOFRAN ) 4 MG tablet Take 1 tablet (4 mg total) by mouth every 8 (eight) hours as needed for nausea or vomiting. 20 tablet 0   potassium chloride  SA (KLOR-CON  M) 20 MEQ tablet Take 1 tablet (20 mEq total) by mouth daily. 7 tablet 0   rosuvastatin  (CRESTOR ) 40 MG tablet Take 40 mg by mouth daily.     traMADol  (ULTRAM ) 50 MG tablet Take 1 tablet (50 mg total) by mouth every 4 (four) hours as needed for moderate pain (pain score 4-6). 30 tablet 0   furosemide  (LASIX ) 40 MG tablet Take 1 tablet (40 mg total) by mouth daily as needed for fluid or edema.  Take Lasix  40 mg daily x 3 days followed by Lasix  40 mg daily as needed for swelling, weight gain, or shortness of breath. 30 tablet 3   No current facility-administered medications for this visit.     Review of Systems    She denies chest pain, palpitations, pnd, n, v, dizziness, syncope, edema, weight gain, or early satiety. All other systems reviewed and are otherwise negative except as noted above.     Cardiac Rehabilitation Eligibility Assessment  The patient is ready to start cardiac rehabilitation pending clearance from the cardiac surgeon.    Physical Exam    VS:  BP 116/76 (BP Location: Left Arm, Patient Position: Sitting, Cuff Size: Normal)   Pulse 70   Ht 4\' 10"  (1.473 m)   Wt 163 lb 12.8 oz (74.3 kg)   SpO2 96%   BMI 34.23 kg/m   GEN: Well nourished, well developed, in no acute distress. HEENT: normal. Neck: Supple, no JVD, carotid bruits, or masses. Cardiac: RRR, no murmurs, rubs, or gallops. No clubbing, cyanosis, edema.  Radials/DP/PT 2+ and equal bilaterally.   Midsternal incision clean, dry, intact and well-approximated. Respiratory:  Respirations regular and unlabored, clear to auscultation bilaterally. GI: Soft, nontender, nondistended, BS + x 4. MS: no deformity or atrophy. Skin: warm and dry, no rash. Neuro:  Strength and sensation are intact. Psych: Normal affect.  Accessory Clinical Findings    ECG personally reviewed by me today - EKG Interpretation Date/Time:  Tuesday Apr 08 2024 11:20:45 EDT Ventricular Rate:  70 PR Interval:  154 QRS Duration:  84 QT Interval:  402 QTC Calculation: 434 R Axis:   18  Text Interpretation: Normal sinus rhythm Septal infarct , age undetermined When compared with ECG of 18-Mar-2024 07:17, Septal infarct is now Present ST no longer elevated in Lateral leads Nonspecific T wave abnormality now evident in Lateral leads Confirmed by Marlana Silvan ([id number]) on 04/08/2024 11:23:52 AM  - no acute changes.   Lab Results  Component Value Date   WBC 7.1 04/08/2024   HGB 11.0 (L) 04/08/2024   HCT 35.1 04/08/2024   MCV 93 04/08/2024   PLT 373 04/08/2024   Lab Results  Component Value Date   CREATININE 0.60 04/08/2024   BUN 7 (L) 04/08/2024   NA 140 04/08/2024   K 5.0 04/08/2024   CL 102 04/08/2024   CO2 25 04/08/2024   Lab Results  Component Value Date   ALT 24 03/14/2024   AST 28 03/14/2024   ALKPHOS 42 03/14/2024   BILITOT 0.7 03/14/2024   Lab Results  Component Value Date   CHOL 145 03/14/2024   HDL 68 03/14/2024   LDLCALC 67 03/14/2024   TRIG 48 03/14/2024   CHOLHDL 2.1 03/14/2024    Lab Results  Component Value Date   HGBA1C 6.0 (H) 03/14/2024    Assessment & Plan   1. CAD/dyspnea on exertion:  S/p CABG x 3 (LIMA-LAD, SVG-OM, SVG-diagonal) in 02/2024. Echocardiogram showed EF 60 to 65%, normal LV function, no RWMA, normal RV systolic function, no significant valvular abnormalities.  He notes ongoing mild dyspnea on exertion, mild orthopnea.  She has had 1 episode of palpitations since  hospital discharge, lasting 1 to 2 minutes, no associated symptoms.  She has some mild incisional discomfort, she denies chest pain, denies further palpitations, dizziness, edema, PND, weight gain.  Euvolemic and well compensated on exam.  Will check CBC, BMET, BNP today.  Will have her take Lasix  40 mg daily x 3 days  followed by Lasix  40 mg daily as needed for swelling, weight gain, shortness of breath. She also reports a history of reactive airway disease, she plans for follow-up with pulmonology. She has noticed some mild bradycardia, fatigue. She is taking her metoprolol  tartrate once daily.  Will change to metoprolol  succinate 12.5 mg daily to see if she tolerates this better.  Continue to monitor symptoms.  Continue aspirin , Plavix , Crestor .  2. Hypertension: BP well controlled. Continue current antihypertensive regimen.   3. Hyperlipidemia: LDL was 67 in 02/2024.  Crestor  was recently increased to 40 mg daily.  She will be due for repeat fasting lipids, LFTs at follow-up. Continue Crestor .   4. Hypothyroidism: TSH was 0.953 in 02/2024.  Managed per PCP.  On levothyroxine .  5. Prediabetes: A1c was 6.0 in 02/2024.  Monitored and managed per PCP.  6. Disposition:  Follow-up with CT surgery as scheduled.  Follow-up in 3 months with Dr. Berry Bristol.   Jude Norton, NP 04/10/2024, 8:45 PM

## 2024-04-08 NOTE — Patient Instructions (Signed)
 Medication Instructions:  Stop Metoprolol  Tartrate 12.5 mg  Start Metoprolol  Succinate 12.5 mg daily Furosemide  40 mg daily for 3 days, then resume 40 mg daily as needed only for weight gain or swelling.  *If you need a refill on your cardiac medications before your next appointment, please call your pharmacy*  Lab Work: CBC, BMET, BNP today  Testing/Procedures: NONE ordered at this time of appointment   Follow-Up: At [facility], you and your health needs are our priority.  As part of our continuing mission to provide you with exceptional heart care, our providers are all part of one team.  This team includes your primary Cardiologist (physician) and Advanced Practice Providers or APPs (Physician Assistants and Nurse Practitioners) who all work together to provide you with the care you need, when you need it.  Your next appointment:   3 month(s)  Provider:   Knox Perl, MD or Marlana Silvan, NP          We recommend signing up for the patient portal called "MyChart".  Sign up information is provided on this After Visit Summary.  MyChart is used to connect with patients for Virtual Visits (Telemedicine).  Patients are able to view lab/test results, encounter notes, upcoming appointments, etc.  Non-urgent messages can be sent to your provider as well.   To learn more about what you can do with MyChart, go to [email or web address].

## 2024-04-09 ENCOUNTER — Ambulatory Visit: Payer: Self-pay | Admitting: Nurse Practitioner

## 2024-04-09 LAB — CBC
Hematocrit: 35.1 % (ref 34.0–46.6)
Hemoglobin: 11 g/dL — ABNORMAL LOW (ref 11.1–15.9)
MCH: 29.2 pg (ref 26.6–33.0)
MCHC: 31.3 g/dL — ABNORMAL LOW (ref 31.5–35.7)
MCV: 93 fL (ref 79–97)
Platelets: 373 10*3/uL (ref 150–450)
RBC: 3.77 x10E6/uL (ref 3.77–5.28)
RDW: 14.4 % (ref 11.7–15.4)
WBC: 7.1 10*3/uL (ref 3.4–10.8)

## 2024-04-09 LAB — BASIC METABOLIC PANEL WITH GFR
BUN/Creatinine Ratio: 12 (ref 12–28)
BUN: 7 mg/dL — ABNORMAL LOW (ref 8–27)
CO2: 25 mmol/L (ref 20–29)
Calcium: 10 mg/dL (ref 8.7–10.3)
Chloride: 102 mmol/L (ref 96–106)
Creatinine, Ser: 0.6 mg/dL (ref 0.57–1.00)
Glucose: 91 mg/dL (ref 70–99)
Potassium: 5 mmol/L (ref 3.5–5.2)
Sodium: 140 mmol/L (ref 134–144)
eGFR: 98 mL/min/{1.73_m2} (ref >59)

## 2024-04-09 LAB — BRAIN NATRIURETIC PEPTIDE: BNP: 59.4 pg/mL (ref 0.0–100.0)

## 2024-04-10 ENCOUNTER — Encounter: Payer: Self-pay | Admitting: Nurse Practitioner

## 2024-04-13 ENCOUNTER — Other Ambulatory Visit: Payer: Self-pay | Admitting: Physician Assistant

## 2024-04-30 NOTE — Patient Instructions (Addendum)
  You are encouraged to enroll and participate in the outpatient cardiac rehab program beginning as soon as practical.  Continue to avoid any heavy lifting or strenuous use of your arms or shoulders for at least a total of three months from the time of surgery.  After three months you may gradually increase how much you lift or otherwise use your arms or chest as tolerated, with limits based upon whether or not activities lead to the return of significant discomfort.  You may return to driving an automobile as long as you are no longer requiring oral narcotic pain relievers during the daytime.  It would be wise to start driving only short distances during the daylight and gradually increase from there as you feel comfortable.  Take Lasix  40mg  daily for 7 days and take potassium supplement for 7 days then take Lasix  and potassium daily as needed for swelling, shortness of breath or weight gain greater than 3lbs overnight or 5lbs in 1 week

## 2024-04-30 NOTE — Progress Notes (Signed)
 [address]       Linda Cardenas [place] [id number]             [phone]       HPI: Linda Cardenas is a 68 year old with a past medical history of hyperlipidemia, hypertension, bradycardia, hypothyroidism, GERD, and grade 1 diastolic heart dysfunction. The patient returns for routine postoperative follow-up having undergone CABG x 3 by Dr. Shyrl on 03/17/24. The patient's early postoperative recovery while in the hospital was notable for expected postoperative acute blood loss anemia requiring transfusion of 1U PRBCs and she was started on iron supplement. This has improved since discharge. She saw cardiology on 05/20 and reported some dyspnea with exertion and 1 episode of palpitations without associated symptoms. She was transitioned to Toprol  XL 25mg  daily and started on Lasix  40mg  daily and then daily as needed after that.  Since hospital discharge the patient reports she no longer has chest pain and is not taking narcotic pain medication, Gabapentin  or Tylenol  anymore. She does admit to continued shortness of breath and wheezing at times that has remained the same. The wheezing was present prior to surgery and she has a pulmonology visit scheduled for August. She does sleep in a recliner for comfort. Her daughter admits that she is not always compliant with medications. She reports she is ambulating well around the house and is doing more work around the house but her arms still feel weak and shaky after certain things such as hand cleaning clothes.   Current Outpatient Medications  Medication Sig Dispense Refill   acetaminophen  (TYLENOL ) 500 MG tablet Take 1-2 tablets (500-1,000 mg total) by mouth every 6 (six) hours as needed.     aspirin  81 MG tablet Take 81 mg by mouth daily.     calcium  carbonate (OS-CAL) 600 MG TABS Take 600 mg by mouth daily.     Cholecalciferol (VITAMIN D) 2000 UNITS tablet Take 2,000 Units by mouth daily.     clopidogrel  (PLAVIX ) 75 MG tablet Take 1  tablet (75 mg total) by mouth daily. 30 tablet 3   ferrous sulfate  325 (65 FE) MG tablet Take 1 tablet (325 mg total) by mouth daily with breakfast. 30 tablet 0   fluticasone  furoate-vilanterol (BREO ELLIPTA ) 100-25 MCG/ACT AEPB Inhale 1 puff into the lungs daily. (Patient taking differently: Inhale 1 puff into the lungs daily as needed (for wheezing and shortness of breath).) 1 each 5   furosemide  (LASIX ) 40 MG tablet Take 1 tablet (40 mg total) by mouth daily as needed for fluid or edema. Take Lasix  40 mg daily x 3 days followed by Lasix  40 mg daily as needed for swelling, weight gain, or shortness of breath. 30 tablet 3   levothyroxine  (SYNTHROID ) 100 MCG tablet Take 100 mcg by mouth daily before breakfast.     methocarbamol  (ROBAXIN ) 500 MG tablet Take 0.5 tablets (250 mg total) by mouth every 8 (eight) hours as needed for muscle spasms. 15 tablet 0   metoprolol  succinate (TOPROL  XL) 25 MG 24 hr tablet Take 0.5 tablets (12.5 mg total) by mouth daily. 45 tablet 3   Multiple Vitamins-Minerals (MULTIVITAMIN WITH MINERALS) tablet Take 1 tablet by mouth daily.     ondansetron  (ZOFRAN ) 4 MG tablet Take 1 tablet (4 mg total) by mouth every 8 (eight) hours as needed for nausea or vomiting. 20 tablet 0   potassium chloride  SA (KLOR-CON  M) 20 MEQ tablet Take 1 tablet (20 mEq total) by mouth daily.  7 tablet 0   rosuvastatin  (CRESTOR ) 40 MG tablet Take 40 mg by mouth daily.     traMADol  (ULTRAM ) 50 MG tablet Take 1 tablet (50 mg total) by mouth every 4 (four) hours as needed for moderate pain (pain score 4-6). 30 tablet 0   No current facility-administered medications for this visit.   Vitals: Today's Vitals   05/13/24 1124  BP: 127/77  Pulse: 60  Resp: 18  SpO2: 96%  Weight: 164 lb (74.4 kg)  Height: 4' 10 (1.473 m)   Body mass index is 34.28 kg/m.  Physical Exam: General: Alert and oriented, no acute distress Neuro: Grossly intact CV: Regular rate and rhythm, no murmur Pulm: Slightly  diminished left basilar breath sounds, no wheezing GI: +BS, nontender, no distension Extremities: 1+ edema bilateral lower extremities Wound: Wounds are well healed without sign of infection  Diagnostic Tests: CXR with improved aeration of the right base but continued trace-small left pleural effusion.   Impression/Plan: S/P CABG: The patient is progressing well from surgery. She is ambulating well around her house and is able to do more work around her house. She does admit to continued shortness of breath and wheezing at times which was present prior to surgery. She was started on an inhaler and has a follow up scheduled with pulmonology. She does still have a small left pleural effusion and 1+ edema of bilateral lower extremities. She took 3 days of Lasix  40mg  as recommended by cardiology but has not taken any since then. I recommend she take 7 days of Lasix  40mg  daily with potassium supplement and then take as needed for swelling or weight gain. Since this is a small pleural effusion and she is ambulating well I think it is reasonable that she does not need to follow up in our office again for repeat chest xray unless shortness of breath worsens. I discussed this with the patient and she was agreeable. Her appetite has improved and we reviewed proper diet and exercise. Her H/H has improved and I told her she could discontinue her iron supplement if she would like but that it might be beneficial since she does not eat meat. Her incisions have healed well without sign of infection. We reviewed the importance of medication compliance and she was understanding. We reviewed continued sternal precautions. I cleared her for driving and cardiac rehab today. Patient will continue to follow up with cardiology and pulmonology and she will follow up in our clinic as needed.   Con JAYSON Helm, PA-C [facility] [phone]

## 2024-05-09 ENCOUNTER — Other Ambulatory Visit: Payer: Self-pay | Admitting: Thoracic Surgery (Cardiothoracic Vascular Surgery)

## 2024-05-09 DIAGNOSIS — Z951 Presence of aortocoronary bypass graft: Secondary | ICD-10-CM

## 2024-05-13 ENCOUNTER — Ambulatory Visit (HOSPITAL_COMMUNITY)
Admission: RE | Admit: 2024-05-13 | Discharge: 2024-05-13 | Disposition: A | Discharge status: Home / Self Care | Source: Ambulatory Visit | Attending: Internal Medicine | Admitting: Internal Medicine

## 2024-05-13 ENCOUNTER — Other Ambulatory Visit: Payer: Self-pay | Admitting: Physician Assistant

## 2024-05-13 ENCOUNTER — Ambulatory Visit: Payer: Self-pay | Attending: Thoracic Surgery (Cardiothoracic Vascular Surgery) | Admitting: Physician Assistant

## 2024-05-13 ENCOUNTER — Encounter: Payer: Self-pay | Admitting: Physician Assistant

## 2024-05-13 VITALS — BP 127/77 | HR 60 | Resp 18 | Ht <= 58 in | Wt 164.0 lb

## 2024-05-13 DIAGNOSIS — Z951 Presence of aortocoronary bypass graft: Secondary | ICD-10-CM | POA: Insufficient documentation

## 2024-05-13 MED ORDER — POTASSIUM CHLORIDE CRYS ER 20 MEQ PO TBCR: 20.0000 meq | EXTENDED_RELEASE_TABLET | Freq: Every day | ORAL | 0 refills | Status: AC

## 2024-05-16 ENCOUNTER — Other Ambulatory Visit (HOSPITAL_COMMUNITY)

## 2024-05-16 ENCOUNTER — Ambulatory Visit: Admitting: Thoracic Surgery (Cardiothoracic Vascular Surgery)

## 2024-06-02 ENCOUNTER — Encounter (HOSPITAL_COMMUNITY): Payer: Self-pay

## 2024-06-04 ENCOUNTER — Other Ambulatory Visit: Payer: Self-pay | Admitting: Physician Assistant

## 2024-06-16 ENCOUNTER — Encounter (HOSPITAL_COMMUNITY): Payer: Self-pay

## 2024-06-24 ENCOUNTER — Encounter (HOSPITAL_COMMUNITY): Payer: Self-pay

## 2024-06-24 ENCOUNTER — Encounter (HOSPITAL_COMMUNITY)
Admission: RE | Admit: 2024-06-24 | Discharge: 2024-06-24 | Disposition: A | Discharge status: Home / Self Care | Source: Ambulatory Visit | Attending: Cardiology | Admitting: Cardiology

## 2024-06-24 VITALS — BP 98/70 | HR 53 | Ht <= 58 in | Wt 169.1 lb

## 2024-06-24 DIAGNOSIS — Z951 Presence of aortocoronary bypass graft: Secondary | ICD-10-CM | POA: Diagnosis present

## 2024-06-24 DIAGNOSIS — Z48812 Encounter for surgical aftercare following surgery on the circulatory system: Secondary | ICD-10-CM | POA: Insufficient documentation

## 2024-06-24 HISTORY — DX: Atherosclerotic heart disease of native coronary artery without angina pectoris: I25.10

## 2024-06-24 HISTORY — DX: Essential (primary) hypertension: I10

## 2024-06-24 NOTE — Progress Notes (Signed)
 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  Patient's Home Medications on Admission:  Current Outpatient Medications:    acetaminophen  (TYLENOL ) 500 MG tablet, Take 1-2 tablets (500-1,000 mg total) by mouth every 6 (six) hours as needed., Disp: , Rfl:    aspirin  81 MG tablet, Take 81 mg by mouth daily., Disp: , Rfl:    calcium  carbonate (OS-CAL) 600 MG TABS, Take 600 mg by mouth daily., Disp: , Rfl:    Cholecalciferol (VITAMIN D) 2000 UNITS tablet, Take 2,000 Units by mouth daily., Disp: , Rfl:    clopidogrel  (PLAVIX ) 75 MG tablet, Take 1 tablet (75 mg total) by mouth daily., Disp: 30 tablet, Rfl: 3   ferrous sulfate  325 (65 FE) MG tablet, Take 1 tablet (325 mg total) by mouth daily with breakfast., Disp: 30 tablet, Rfl: 0   furosemide  (LASIX ) 40 MG tablet, Take 1 tablet (40 mg total) by mouth daily as needed for fluid or edema. Take Lasix  40 mg daily x 3 days followed by Lasix  40 mg daily as needed for swelling, weight gain, or shortness of breath., Disp: 30 tablet, Rfl: 3   gabapentin  (NEURONTIN ) 300 MG/6ML solution, 2 mL Orally twice a day for 30 days, Disp: , Rfl:    levothyroxine  (SYNTHROID ) 100 MCG tablet, Take 100 mcg by mouth daily before breakfast., Disp: , Rfl:    metoprolol  succinate (TOPROL  XL) 25 MG 24 hr tablet, Take 0.5 tablets (12.5 mg total) by mouth daily., Disp: 45 tablet, Rfl: 3   Multiple Vitamins-Minerals (MULTIVITAMIN WITH MINERALS) tablet, Take 1 tablet by mouth daily., Disp: , Rfl:    potassium  chloride SA (KLOR-CON  M) 20 MEQ tablet, Take 1 tablet (20 mEq total) by mouth daily. Take 1 tablet daily for 7 days then take 1 tablet every time you take Lasix , Disp: 30 tablet, Rfl: 0   rosuvastatin  (CRESTOR ) 40 MG tablet, Take 40 mg by mouth daily., Disp: , Rfl:    fluticasone  furoate-vilanterol (BREO ELLIPTA ) 100-25 MCG/ACT AEPB, Inhale 1 puff into the lungs daily. (Patient taking differently: Inhale 1 puff into the lungs daily as needed (for wheezing and shortness of breath).), Disp: 1 each, Rfl: 5   methocarbamol  (ROBAXIN ) 500 MG tablet, Take 0.5 tablets (250 mg total) by mouth every 8 (eight) hours as needed for muscle spasms. (Patient not taking: Reported on 06/24/2024), Disp: 15 tablet, Rfl: 0   PULMICORT  FLEXHALER 90 MCG/ACT inhaler, Inhale 1 puff into the lungs 2 (two) times daily., Disp: , Rfl:    sertraline (ZOLOFT) 25 MG tablet, 1 tablet Orally Once a day for 30 day(s) (Patient not taking: Reported on 06/24/2024), Disp: , Rfl:    traMADol  (ULTRAM ) 50 MG tablet, Take 1 tablet (50 mg total) by mouth every 4 (four) hours as needed for moderate pain (pain score 4-6). (Patient not taking: Reported on 06/24/2024), Disp: 30 tablet, Rfl: 0  Past Medical History: Past Medical History:  Diagnosis Date   Arthritis    Back pain     CHRONIC LOW BACK PAIN - GETS INJECTIONS  IN BACK   Constipation    Coronary artery disease    Eczema    Elevated liver enzymes    Gallstones    GERD (gastroesophageal reflux disease)    Hyperlipidemia    Hypertension    Hypothyroidism    Lactose intolerance    Shortness of breath    Thyroid  disease     Tobacco Use: Social History   Tobacco Use  Smoking Status Never  Smokeless Tobacco Never    Labs: Review Flowsheet       Latest Ref Rng & Units 03/14/2024 03/15/2024 03/17/2024  Labs for ITP Cardiac and Pulmonary Rehab  Cholestrol 0 - 200 mg/dL 854  - -  LDL (calc) 0 - 99 mg/dL 67  - -  HDL-C >59 mg/dL 68  - -  Trlycerides <849 mg/dL 48  - -  Hemoglobin  J8r 4.8 - 5.6 % 6.0  - -  PH, Arterial 7.35 - 7.45 - 7.47  7.356  7.390  7.328  7.463  7.461  7.449  7.459   PCO2 arterial 32 - 48 mmHg - 39  45.2  40.0  42.2  32.3  36.0  34.1  35.2   Bicarbonate 20.0 - 28.0 mmol/L - 28.4  25.2  24.2  22.4  23.1  25.7  24.6  23.6  24.9   TCO2 22 - 32 mmol/L - - 26  25  24  24  24  27  28  28  26  25  26  26  26    Acid-base deficit 0.0 - 2.0 mmol/L - - 1.0  3.0  1.0   O2 Saturation % - 98.2  99  99  99  100  100  78  100  100     Details       Multiple values from one day are sorted in reverse-chronological order         Capillary Blood Glucose: Lab Results  Component Value Date   GLUCAP 132 (H) 03/19/2024   GLUCAP 101 (H) 03/19/2024   GLUCAP 124 (H) 03/18/2024   GLUCAP 151 (H) 03/18/2024   GLUCAP 136 (H) 03/18/2024     Exercise Target Goals: Exercise Program Goal: Individual exercise prescription set using results from initial 6 min walk test and THRR while considering  patient's activity barriers and safety.   Exercise Prescription Goal: Initial exercise prescription builds to 30-45 minutes a day of aerobic activity, 2-3 days per week.  Home exercise guidelines will be given to patient during program as part of exercise prescription that the participant will acknowledge.  Activity Barriers & Risk Stratification:  Activity Barriers & Cardiac Risk Stratification - 06/24/24 1215       Activity Barriers & Cardiac Risk Stratification   Activity Barriers Arthritis;Joint Problems;Deconditioning;Balance Concerns    Cardiac Risk Stratification High          6 Minute Walk:  6 Minute Walk     Row Name 06/24/24 1213         6 Minute Walk   Phase Initial     Distance 828 feet     Walk Time 6 minutes     # of Rest Breaks 0     MPH 1.7     METS 1.81     RPE 9     Perceived Dyspnea  0     VO2 Peak 6.34     Symptoms No     Resting HR 53 bpm     Resting BP  98/70     Resting Oxygen Saturation  98 %     Exercise Oxygen Saturation   during 6 min walk 98 %     Max Ex. HR 107 bpm     Max Ex. BP 150/72     2 Minute Post BP 120/70        Oxygen Initial Assessment:   Oxygen Re-Evaluation:   Oxygen Discharge (Final Oxygen Re-Evaluation):   Initial Exercise Prescription:  Initial Exercise Prescription - 06/24/24 1200       Date of Initial Exercise RX and Referring Provider   Date 06/24/24    Referring Provider Gordy Bergamo, MD    Expected Discharge Date 09/17/24      NuStep   Level 1    SPM 70    Minutes 25    METs 1.8      Prescription Details   Frequency (times per week) 3    Duration Progress to 30 minutes of continuous aerobic without signs/symptoms of physical distress      Intensity   THRR 40-80% of Max Heartrate 61-122    Ratings of Perceived Exertion 11-13    Perceived Dyspnea 0-4      Progression   Progression Continue progressive overload as per policy without signs/symptoms or physical distress.      Resistance Training   Training Prescription Yes    Weight 2 lbs    Reps 10-15          Perform Capillary Blood Glucose checks as needed.  Exercise Prescription Changes:   Exercise Comments:   Exercise Goals and Review:   Exercise Goals     Row Name 06/24/24 1215             Exercise Goals   Increase Physical Activity Yes       Intervention Provide advice, education, support and counseling about physical activity/exercise needs.;Develop an individualized exercise prescription for aerobic and resistive training based on initial evaluation findings, risk stratification, comorbidities and participant's personal goals.       Expected Outcomes Short Term: Attend rehab on a regular basis to increase amount of physical activity.;Long Term: Add in home exercise to make exercise part of routine and to increase amount of physical activity.;Long Term: Exercising regularly at least 3-5 days a week.       Increase Strength and Stamina Yes       Intervention Provide advice, education,  support and counseling about physical activity/exercise needs.;Develop an individualized exercise prescription for aerobic and resistive training based on initial evaluation findings, risk stratification, comorbidities and participant's personal goals.       Expected Outcomes Short Term: Increase workloads from initial exercise prescription for resistance, speed, and METs.;Short Term: Perform resistance training exercises routinely during rehab and add in resistance training at home;Long Term: Improve cardiorespiratory fitness, muscular endurance and strength as measured by increased METs and functional capacity ( )       Able to understand and use rate of perceived exertion (RPE) scale Yes       Intervention Provide education and explanation on how to use RPE scale       Expected Outcomes Short Term: Able to use RPE daily in rehab to express subjective intensity level;Long Term:  Able to use RPE to guide intensity level when exercising independently       Knowledge and understanding of Target Heart Rate Range (THRR) Yes       Intervention Provide education and explanation of THRR including how the numbers were  predicted and where they are located for reference       Expected Outcomes Short Term: Able to state/look up THRR;Long Term: Able to use THRR to govern intensity when exercising independently;Short Term: Able to use daily as guideline for intensity in rehab       Understanding of Exercise Prescription Yes       Intervention Provide education, explanation, and written materials on patient's individual exercise prescription       Expected Outcomes Short Term: Able to explain program exercise prescription;Long Term: Able to explain home exercise prescription to exercise independently          Exercise Goals Re-Evaluation :   Discharge Exercise Prescription (Final Exercise Prescription Changes):   Nutrition:  Target Goals: Understanding of nutrition guidelines, daily intake of sodium  1500mg , cholesterol 200mg , calories 30% from fat and 7% or less from saturated fats, daily to have 5 or more servings of fruits and vegetables.  Biometrics:  Pre Biometrics - 06/24/24 0945       Pre Biometrics   Waist Circumference 43.75 inches    Hip Circumference 47.5 inches    Waist to Hip Ratio 0.92 %    Triceps Skinfold 32 mm    % Body Fat 49.4 %    Grip Strength 13 kg    Flexibility --   No performed due to h/o low back surgery   Single Leg Stand 2.68 seconds           Nutrition Therapy Plan and Nutrition Goals:   Nutrition Assessments:  MEDIFICTS Score Key: >=70 Need to make dietary changes  40-70 Heart Healthy Diet <= 40 Therapeutic Level Cholesterol Diet    Picture Your Plate Scores: <59 Unhealthy dietary pattern with much room for improvement. 41-50 Dietary pattern unlikely to meet recommendations for good health and room for improvement. 51-60 More healthful dietary pattern, with some room for improvement.  >60 Healthy dietary pattern, although there may be some specific behaviors that could be improved.    Nutrition Goals Re-Evaluation:   Nutrition Goals Re-Evaluation:   Nutrition Goals Discharge (Final Nutrition Goals Re-Evaluation):   Psychosocial: Target Goals: Acknowledge presence or absence of significant depression and/or stress, maximize coping skills, provide positive support system. Participant is able to verbalize types and ability to use techniques and skills needed for reducing stress and depression.  Initial Review & Psychosocial Screening:  Initial Psych Review & Screening - 06/24/24 1312       Initial Review   Current issues with None Identified      Family Dynamics   Good Support System? Yes   Leianne has her husband, daughter and friends who live in the area fo support. Feige's son lives out of state. Kariana's sister lives in [place].     Barriers   Psychosocial barriers to participate in program There are no identifiable  barriers or psychosocial needs.      Screening Interventions   Interventions Encouraged to exercise;Provide feedback about the scores to participant    Expected Outcomes Long Term Goal: Stressors or current issues are controlled or eliminated.          Quality of Life Scores:  Quality of Life - 06/24/24 1217       Quality of Life   Select Quality of Life      Quality of Life Scores   Health/Function Pre 26.14 %    Socioeconomic Pre 30 %    Psych/Spiritual Pre 30 %    Family Pre 30 %  GLOBAL Pre 28.31 %         Scores of 19 and below usually indicate a poorer quality of life in these areas.  A difference of  2-3 points is a clinically meaningful difference.  A difference of 2-3 points in the total score of the Quality of Life Index has been associated with significant improvement in overall quality of life, self-image, physical symptoms, and general health in studies assessing change in quality of life.  PHQ-9: Review Flowsheet       06/24/2024  Depression screen PHQ 2/9  Decreased Interest 0  Down, Depressed, Hopeless 0  PHQ - 2 Score 0  Altered sleeping 0  Tired, decreased energy 1  Change in appetite 0  Feeling bad or failure about yourself  0  Trouble concentrating 0  Moving slowly or fidgety/restless 1  Suicidal thoughts 0  PHQ-9 Score 2  Difficult doing work/chores Somewhat difficult   Interpretation of Total Score  Total Score Depression Severity:  1-4 = Minimal depression, 5-9 = Mild depression, 10-14 = Moderate depression, 15-19 = Moderately severe depression, 20-27 = Severe depression   Psychosocial Evaluation and Intervention:   Psychosocial Re-Evaluation:   Psychosocial Discharge (Final Psychosocial Re-Evaluation):   Vocational Rehabilitation: Provide vocational rehab assistance to qualifying candidates.   Vocational Rehab Evaluation & Intervention:  Vocational Rehab - 06/24/24 1220       Initial Vocational Rehab Evaluation & Intervention    Assessment shows need for Vocational Rehabilitation No      Vocational Rehab Re-Evaulation   Comments Pt is retired          Education: Education Goals: Education classes will be provided on a weekly basis, covering required topics. Participant will state understanding/return demonstration of topics presented.     Core Videos: Exercise    Move It!  Clinical staff conducted group or individual video education with verbal and written material and guidebook.  Patient learns the recommended Pritikin exercise program. Exercise with the goal of living a long, healthy life. Some of the health benefits of exercise include controlled diabetes, healthier blood pressure levels, improved cholesterol levels, improved heart and lung capacity, improved sleep, and better body composition. Everyone should speak with their doctor before starting or changing an exercise routine.  Biomechanical Limitations Clinical staff conducted group or individual video education with verbal and written material and guidebook.  Patient learns how biomechanical limitations can impact exercise and how we can mitigate and possibly overcome limitations to have an impactful and balanced exercise routine.  Body Composition Clinical staff conducted group or individual video education with verbal and written material and guidebook.  Patient learns that body composition (ratio of muscle mass to fat mass) is a key component to assessing overall fitness, rather than body weight alone. Increased fat mass, especially visceral belly fat, can put us  at increased risk for metabolic syndrome, type 2 diabetes, heart disease, and even death. It is recommended to combine diet and exercise (cardiovascular and resistance training) to improve your body composition. Seek guidance from your physician and exercise physiologist before implementing an exercise routine.  Exercise Action Plan Clinical staff conducted group or individual video  education with verbal and written material and guidebook.  Patient learns the recommended strategies to achieve and enjoy long-term exercise adherence, including variety, self-motivation, self-efficacy, and positive decision making. Benefits of exercise include fitness, good health, weight management, more energy, better sleep, less stress, and overall well-being.  Medical   Heart Disease Risk Reduction Clinical staff conducted group  or individual video education with verbal and written material and guidebook.  Patient learns our heart is our most vital organ as it circulates oxygen, nutrients, white blood cells, and hormones throughout the entire body, and carries waste away. Data supports a plant-based eating plan like the Pritikin Program for its effectiveness in slowing progression of and reversing heart disease. The video provides a number of recommendations to address heart disease.   Metabolic Syndrome and Belly Fat  Clinical staff conducted group or individual video education with verbal and written material and guidebook.  Patient learns what metabolic syndrome is, how it leads to heart disease, and how one can reverse it and keep it from coming back. You have metabolic syndrome if you have 3 of the following 5 criteria: abdominal obesity, high blood pressure, high triglycerides, low HDL cholesterol, and high blood sugar.  Hypertension and Heart Disease Clinical staff conducted group or individual video education with verbal and written material and guidebook.  Patient learns that high blood pressure, or hypertension, is very common in the [place] . Hypertension is largely due to excessive salt intake, but other important risk factors include being overweight, physical inactivity, drinking too much alcohol, smoking, and not eating enough potassium from fruits and vegetables. High blood pressure is a leading risk factor for heart attack, stroke, congestive heart failure, dementia, kidney  failure, and premature death. Long-term effects of excessive salt intake include stiffening of the arteries and thickening of heart muscle and organ damage. Recommendations include ways to reduce hypertension and the risk of heart disease.  Diseases of Our Time - Focusing on Diabetes Clinical staff conducted group or individual video education with verbal and written material and guidebook.  Patient learns why the best way to stop diseases of our time is prevention, through food and other lifestyle changes. Medicine (such as prescription pills and surgeries) is often only a Band-Aid on the problem, not a long-term solution. Most common diseases of our time include obesity, type 2 diabetes, hypertension, heart disease, and cancer. The Pritikin Program is recommended and has been proven to help reduce, reverse, and/or prevent the damaging effects of metabolic syndrome.  Nutrition   Overview of the Pritikin Eating Plan  Clinical staff conducted group or individual video education with verbal and written material and guidebook.  Patient learns about the Pritikin Eating Plan for disease risk reduction. The Pritikin Eating Plan emphasizes a wide variety of unrefined, minimally-processed carbohydrates, like fruits, vegetables, whole grains, and legumes. Go, Caution, and Stop food choices are explained. Plant-based and lean animal proteins are emphasized. Rationale provided for low sodium intake for blood pressure control, low added sugars for blood sugar stabilization, and low added fats and oils for coronary artery disease risk reduction and weight management.  Calorie Density  Clinical staff conducted group or individual video education with verbal and written material and guidebook.  Patient learns about calorie density and how it impacts the Pritikin Eating Plan. Knowing the characteristics of the food you choose will help you decide whether those foods will lead to weight gain or weight loss, and whether  you want to consume more or less of them. Weight loss is usually a side effect of the Pritikin Eating Plan because of its focus on low calorie-dense foods.  Label Reading  Clinical staff conducted group or individual video education with verbal and written material and guidebook.  Patient learns about the Pritikin recommended label reading guidelines and corresponding recommendations regarding calorie density, added sugars, sodium content, and  whole grains.  Dining Out - Part 1  Clinical staff conducted group or individual video education with verbal and written material and guidebook.  Patient learns that restaurant meals can be sabotaging because they can be so high in calories, fat, sodium, and/or sugar. Patient learns recommended strategies on how to positively address this and avoid unhealthy pitfalls.  Facts on Fats  Clinical staff conducted group or individual video education with verbal and written material and guidebook.  Patient learns that lifestyle modifications can be just as effective, if not more so, as many medications for lowering your risk of heart disease. A Pritikin lifestyle can help to reduce your risk of inflammation and atherosclerosis (cholesterol build-up, or plaque, in the artery walls). Lifestyle interventions such as dietary choices and physical activity address the cause of atherosclerosis. A review of the types of fats and their impact on blood cholesterol levels, along with dietary recommendations to reduce fat intake is also included.  Nutrition Action Plan  Clinical staff conducted group or individual video education with verbal and written material and guidebook.  Patient learns how to incorporate Pritikin recommendations into their lifestyle. Recommendations include planning and keeping personal health goals in mind as an important part of their success.  Healthy Mind-Set    Healthy Minds, Bodies, Hearts  Clinical staff conducted group or individual video  education with verbal and written material and guidebook.  Patient learns how to identify when they are stressed. Video will discuss the impact of that stress, as well as the many benefits of stress management. Patient will also be introduced to stress management techniques. The way we think, act, and feel has an impact on our hearts.  How Our Thoughts Can Heal Our Hearts  Clinical staff conducted group or individual video education with verbal and written material and guidebook.  Patient learns that negative thoughts can cause depression and anxiety. This can result in negative lifestyle behavior and serious health problems. Cognitive behavioral therapy is an effective method to help control our thoughts in order to change and improve our emotional outlook.  Additional Videos:  Exercise    Improving Performance  Clinical staff conducted group or individual video education with verbal and written material and guidebook.  Patient learns to use a non-linear approach by alternating intensity levels and lengths of time spent exercising to help burn more calories and lose more body fat. Cardiovascular exercise helps improve heart health, metabolism, hormonal balance, blood sugar control, and recovery from fatigue. Resistance training improves strength, endurance, balance, coordination, reaction time, metabolism, and muscle mass. Flexibility exercise improves circulation, posture, and balance. Seek guidance from your physician and exercise physiologist before implementing an exercise routine and learn your capabilities and proper form for all exercise.  Introduction to Yoga  Clinical staff conducted group or individual video education with verbal and written material and guidebook.  Patient learns about yoga, a discipline of the coming together of mind, breath, and body. The benefits of yoga include improved flexibility, improved range of motion, better posture and core strength, increased lung function,  weight loss, and positive self-image. Yoga's heart health benefits include lowered blood pressure, healthier heart rate, decreased cholesterol and triglyceride levels, improved immune function, and reduced stress. Seek guidance from your physician and exercise physiologist before implementing an exercise routine and learn your capabilities and proper form for all exercise.  Medical   Aging: Enhancing Your Quality of Life  Clinical staff conducted group or individual video education with verbal and written material and guidebook.  Patient learns key strategies and recommendations to stay in good physical health and enhance quality of life, such as prevention strategies, having an advocate, securing a Health Care Proxy and Power of Attorney, and keeping a list of medications and system for tracking them. It also discusses how to avoid risk for bone loss.  Biology of Weight Control  Clinical staff conducted group or individual video education with verbal and written material and guidebook.  Patient learns that weight gain occurs because we consume more calories than we burn (eating more, moving less). Even if your body weight is normal, you may have higher ratios of fat compared to muscle mass. Too much body fat puts you at increased risk for cardiovascular disease, heart attack, stroke, type 2 diabetes, and obesity-related cancers. In addition to exercise, following the Pritikin Eating Plan can help reduce your risk.  Decoding Lab Results  Clinical staff conducted group or individual video education with verbal and written material and guidebook.  Patient learns that lab test reflects one measurement whose values change over time and are influenced by many factors, including medication, stress, sleep, exercise, food, hydration, pre-existing medical conditions, and more. It is recommended to use the knowledge from this video to become more involved with your lab results and evaluate your numbers to speak  with your doctor.   Diseases of Our Time - Overview  Clinical staff conducted group or individual video education with verbal and written material and guidebook.  Patient learns that according to the CDC, 50% to 70% of chronic diseases (such as obesity, type 2 diabetes, elevated lipids, hypertension, and heart disease) are avoidable through lifestyle improvements including healthier food choices, listening to satiety cues, and increased physical activity.  Sleep Disorders Clinical staff conducted group or individual video education with verbal and written material and guidebook.  Patient learns how good quality and duration of sleep are important to overall health and well-being. Patient also learns about sleep disorders and how they impact health along with recommendations to address them, including discussing with a physician.  Nutrition  Dining Out - Part 2 Clinical staff conducted group or individual video education with verbal and written material and guidebook.  Patient learns how to plan ahead and communicate in order to maximize their dining experience in a healthy and nutritious manner. Included are recommended food choices based on the type of restaurant the patient is visiting.   Fueling a Banker conducted group or individual video education with verbal and written material and guidebook.  There is a strong connection between our food choices and our health. Diseases like obesity and type 2 diabetes are very prevalent and are in large-part due to lifestyle choices. The Pritikin Eating Plan provides plenty of food and hunger-curbing satisfaction. It is easy to follow, affordable, and helps reduce health risks.  Menu Workshop  Clinical staff conducted group or individual video education with verbal and written material and guidebook.  Patient learns that restaurant meals can sabotage health goals because they are often packed with calories, fat, sodium, and sugar.  Recommendations include strategies to plan ahead and to communicate with the manager, chef, or server to help order a healthier meal.  Planning Your Eating Strategy  Clinical staff conducted group or individual video education with verbal and written material and guidebook.  Patient learns about the Pritikin Eating Plan and its benefit of reducing the risk of disease. The Pritikin Eating Plan does not focus on calories. Instead, it emphasizes high-quality, nutrient-rich  foods. By knowing the characteristics of the foods, we choose, we can determine their calorie density and make informed decisions.  Targeting Your Nutrition Priorities  Clinical staff conducted group or individual video education with verbal and written material and guidebook.  Patient learns that lifestyle habits have a tremendous impact on disease risk and progression. This video provides eating and physical activity recommendations based on your personal health goals, such as reducing LDL cholesterol, losing weight, preventing or controlling type 2 diabetes, and reducing high blood pressure.  Vitamins and Minerals  Clinical staff conducted group or individual video education with verbal and written material and guidebook.  Patient learns different ways to obtain key vitamins and minerals, including through a recommended healthy diet. It is important to discuss all supplements you take with your doctor.   Healthy Mind-Set    Smoking Cessation  Clinical staff conducted group or individual video education with verbal and written material and guidebook.  Patient learns that cigarette smoking and tobacco addiction pose a serious health risk which affects millions of people. Stopping smoking will significantly reduce the risk of heart disease, lung disease, and many forms of cancer. Recommended strategies for quitting are covered, including working with your doctor to develop a successful plan.  Culinary   Becoming a Corporate investment banker conducted group or individual video education with verbal and written material and guidebook.  Patient learns that cooking at home can be healthy, cost-effective, quick, and puts them in control. Keys to cooking healthy recipes will include looking at your recipe, assessing your equipment needs, planning ahead, making it simple, choosing cost-effective seasonal ingredients, and limiting the use of added fats, salts, and sugars.  Cooking - Breakfast and Snacks  Clinical staff conducted group or individual video education with verbal and written material and guidebook.  Patient learns how important breakfast is to satiety and nutrition through the entire day. Recommendations include key foods to eat during breakfast to help stabilize blood sugar levels and to prevent overeating at meals later in the day. Planning ahead is also a key component.  Cooking - Educational psychologist conducted group or individual video education with verbal and written material and guidebook.  Patient learns eating strategies to improve overall health, including an approach to cook more at home. Recommendations include thinking of animal protein as a side on your plate rather than center stage and focusing instead on lower calorie dense options like vegetables, fruits, whole grains, and plant-based proteins, such as beans. Making sauces in large quantities to freeze for later and leaving the skin on your vegetables are also recommended to maximize your experience.  Cooking - Healthy Salads and Dressing Clinical staff conducted group or individual video education with verbal and written material and guidebook.  Patient learns that vegetables, fruits, whole grains, and legumes are the foundations of the Pritikin Eating Plan. Recommendations include how to incorporate each of these in flavorful and healthy salads, and how to create homemade salad dressings. Proper handling of ingredients is also covered.  Cooking - Soups and [employer] - Soups and Desserts Clinical staff conducted group or individual video education with verbal and written material and guidebook.  Patient learns that Pritikin soups and desserts make for easy, nutritious, and delicious snacks and meal components that are low in sodium, fat, sugar, and calorie density, while high in vitamins, minerals, and filling fiber. Recommendations include simple and healthy ideas for soups and desserts.   Overview  The Pritikin Solution Program Overview Clinical staff conducted group or individual video education with verbal and written material and guidebook.  Patient learns that the results of the Pritikin Program have been documented in more than 100 articles published in peer-reviewed journals, and the benefits include reducing risk factors for (and, in some cases, even reversing) high cholesterol, high blood pressure, type 2 diabetes, obesity, and more! An overview of the three key pillars of the Pritikin Program will be covered: eating well, doing regular exercise, and having a healthy mind-set.  WORKSHOPS  Exercise: Exercise Basics: Building Your Action Plan Clinical staff led group instruction and group discussion with PowerPoint presentation and patient guidebook. To enhance the learning environment the use of posters, models and videos may be added. At the conclusion of this workshop, patients will comprehend the difference between physical activity and exercise, as well as the benefits of incorporating both, into their routine. Patients will understand the FITT (Frequency, Intensity, Time, and Type) principle and how to use it to build an exercise action plan. In addition, safety concerns and other considerations for exercise and cardiac rehab will be addressed by the presenter. The purpose of this lesson is to promote a comprehensive and effective weekly exercise routine in order to improve patients' overall level of  fitness.   Managing Heart Disease: Your Path to a Healthier Heart Clinical staff led group instruction and group discussion with PowerPoint presentation and patient guidebook. To enhance the learning environment the use of posters, models and videos may be added.At the conclusion of this workshop, patients will understand the anatomy and physiology of the heart. Additionally, they will understand how Pritikin's three pillars impact the risk factors, the progression, and the management of heart disease.  The purpose of this lesson is to provide a high-level overview of the heart, heart disease, and how the Pritikin lifestyle positively impacts risk factors.  Exercise Biomechanics Clinical staff led group instruction and group discussion with PowerPoint presentation and patient guidebook. To enhance the learning environment the use of posters, models and videos may be added. Patients will learn how the structural parts of their bodies function and how these functions impact their daily activities, movement, and exercise. Patients will learn how to promote a neutral spine, learn how to manage pain, and identify ways to improve their physical movement in order to promote healthy living. The purpose of this lesson is to expose patients to common physical limitations that impact physical activity. Participants will learn practical ways to adapt and manage aches and pains, and to minimize their effect on regular exercise. Patients will learn how to maintain good posture while sitting, walking, and lifting.  Balance Training and Fall Prevention  Clinical staff led group instruction and group discussion with PowerPoint presentation and patient guidebook. To enhance the learning environment the use of posters, models and videos may be added. At the conclusion of this workshop, patients will understand the importance of their sensorimotor skills (vision, proprioception, and the vestibular system)  in maintaining their ability to balance as they age. Patients will apply a variety of balancing exercises that are appropriate for their current level of function. Patients will understand the common causes for poor balance, possible solutions to these problems, and ways to modify their physical environment in order to minimize their fall risk. The purpose of this lesson is to teach patients about the importance of maintaining balance as they age and ways to minimize their risk of falling.  WORKSHOPS   Nutrition:  Fueling  a Healthy Body Clinical staff led group instruction and group discussion with PowerPoint presentation and patient guidebook. To enhance the learning environment the use of posters, models and videos may be added. Patients will review the foundational principles of the Pritikin Eating Plan and understand what constitutes a serving size in each of the food groups. Patients will also learn Pritikin-friendly foods that are better choices when away from home and review make-ahead meal and snack options. Calorie density will be reviewed and applied to three nutrition priorities: weight maintenance, weight loss, and weight gain. The purpose of this lesson is to reinforce (in a group setting) the key concepts around what patients are recommended to eat and how to apply these guidelines when away from home by planning and selecting Pritikin-friendly options. Patients will understand how calorie density may be adjusted for different weight management goals.  Mindful Eating  Clinical staff led group instruction and group discussion with PowerPoint presentation and patient guidebook. To enhance the learning environment the use of posters, models and videos may be added. Patients will briefly review the concepts of the Pritikin Eating Plan and the importance of low-calorie dense foods. The concept of mindful eating will be introduced as well as the importance of paying attention to internal hunger  signals. Triggers for non-hunger eating and techniques for dealing with triggers will be explored. The purpose of this lesson is to provide patients with the opportunity to review the basic principles of the Pritikin Eating Plan, discuss the value of eating mindfully and how to measure internal cues of hunger and fullness using the Hunger Scale. Patients will also discuss reasons for non-hunger eating and learn strategies to use for controlling emotional eating.  Targeting Your Nutrition Priorities Clinical staff led group instruction and group discussion with PowerPoint presentation and patient guidebook. To enhance the learning environment the use of posters, models and videos may be added. Patients will learn how to determine their genetic susceptibility to disease by reviewing their family history. Patients will gain insight into the importance of diet as part of an overall healthy lifestyle in mitigating the impact of genetics and other environmental insults. The purpose of this lesson is to provide patients with the opportunity to assess their personal nutrition priorities by looking at their family history, their own health history and current risk factors. Patients will also be able to discuss ways of prioritizing and modifying the Pritikin Eating Plan for their highest risk areas  Menu  Clinical staff led group instruction and group discussion with PowerPoint presentation and patient guidebook. To enhance the learning environment the use of posters, models and videos may be added. Using menus brought in from [employer], or printed from [employer], patients will apply the Pritikin dining out guidelines that were presented in the [employer] video. Patients will also be able to practice these guidelines in a variety of provided scenarios. The purpose of this lesson is to provide patients with the opportunity to practice hands-on learning of the Pritikin Dining Out guidelines  with actual menus and practice scenarios.  Label Reading Clinical staff led group instruction and group discussion with PowerPoint presentation and patient guidebook. To enhance the learning environment the use of posters, models and videos may be added. Patients will review and discuss the Pritikin label reading guidelines presented in Pritikin's Label Reading Educational series video. Using fool labels brought in from local grocery stores and markets, patients will apply the label reading guidelines and determine if the packaged food  meet the Pritikin guidelines. The purpose of this lesson is to provide patients with the opportunity to review, discuss, and practice hands-on learning of the Pritikin Label Reading guidelines with actual packaged food labels. Cooking School  Pritikin's [employer] are designed to teach patients ways to prepare quick, simple, and affordable recipes at home. The importance of nutrition's role in chronic disease risk reduction is reflected in its emphasis in the overall Pritikin program. By learning how to prepare essential core Pritikin Eating Plan recipes, patients will increase control over what they eat; be able to customize the flavor of foods without the use of added salt, sugar, or fat; and improve the quality of the food they consume. By learning a set of core recipes which are easily assembled, quickly prepared, and affordable, patients are more likely to prepare more healthy foods at home. These workshops focus on convenient breakfasts, simple entres, side dishes, and desserts which can be prepared with minimal effort and are consistent with nutrition recommendations for cardiovascular risk reduction. Cooking [employer] are taught by a Armed forces logistics/support/administrative officer (RD) who has been trained by the [employer]. The chef or RD has a clear understanding of the importance of minimizing - if not completely eliminating - added fat, sugar, and  sodium in recipes. Throughout the series of Cooking School Workshop sessions, patients will learn about healthy ingredients and efficient methods of cooking to build confidence in their capability to prepare    Cooking School weekly topics:  Adding Flavor- Sodium-Free  Fast and Healthy Breakfasts  Powerhouse Plant-Based Proteins  Satisfying Salads and Dressings  Simple Sides and Sauces  International Cuisine-Spotlight on the [employer] Zones  Delicious Desserts  Savory Soups  [employer] - Meals in a Astronomer Appetizers and Snacks  Comforting Weekend Breakfasts  One-Pot Wonders   Fast Evening Meals  Landscape architect Your Pritikin Plate  WORKSHOPS   Healthy Mindset (Psychosocial):  Focused Goals, Sustainable Changes Clinical staff led group instruction and group discussion with PowerPoint presentation and patient guidebook. To enhance the learning environment the use of posters, models and videos may be added. Patients will be able to apply effective goal setting strategies to establish at least one personal goal, and then take consistent, meaningful action toward that goal. They will learn to identify common barriers to achieving personal goals and develop strategies to overcome them. Patients will also gain an understanding of how our mind-set can impact our ability to achieve goals and the importance of cultivating a positive and growth-oriented mind-set. The purpose of this lesson is to provide patients with a deeper understanding of how to set and achieve personal goals, as well as the tools and strategies needed to overcome common obstacles which may arise along the way.  From Head to Heart: The Power of a Healthy Outlook  Clinical staff led group instruction and group discussion with PowerPoint presentation and patient guidebook. To enhance the learning environment the use of posters, models and videos may be added. Patients will be able to recognize and  describe the impact of emotions and mood on physical health. They will discover the importance of self-care and explore self-care practices which may work for them. Patients will also learn how to utilize the 4 C's to cultivate a healthier outlook and better manage stress and challenges. The purpose of this lesson is to demonstrate to patients how a healthy outlook is an essential part of maintaining good health, especially as they continue  their cardiac rehab journey.  Healthy Sleep for a Healthy Heart Clinical staff led group instruction and group discussion with PowerPoint presentation and patient guidebook. To enhance the learning environment the use of posters, models and videos may be added. At the conclusion of this workshop, patients will be able to demonstrate knowledge of the importance of sleep to overall health, well-being, and quality of life. They will understand the symptoms of, and treatments for, common sleep disorders. Patients will also be able to identify daytime and nighttime behaviors which impact sleep, and they will be able to apply these tools to help manage sleep-related challenges. The purpose of this lesson is to provide patients with a general overview of sleep and outline the importance of quality sleep. Patients will learn about a few of the most common sleep disorders. Patients will also be introduced to the concept of "sleep hygiene," and discover ways to self-manage certain sleeping problems through simple daily behavior changes. Finally, the workshop will motivate patients by clarifying the links between quality sleep and their goals of heart-healthy living.   Recognizing and Reducing Stress Clinical staff led group instruction and group discussion with PowerPoint presentation and patient guidebook. To enhance the learning environment the use of posters, models and videos may be added. At the conclusion of this workshop, patients will be able to understand the types of stress  reactions, differentiate between acute and chronic stress, and recognize the impact that chronic stress has on their health. They will also be able to apply different coping mechanisms, such as reframing negative self-talk. Patients will have the opportunity to practice a variety of stress management techniques, such as deep abdominal breathing, progressive muscle relaxation, and/or guided imagery.  The purpose of this lesson is to educate patients on the role of stress in their lives and to provide healthy techniques for coping with it.  Learning Barriers/Preferences:  Learning Barriers/Preferences - 06/24/24 1221       Learning Barriers/Preferences   Learning Barriers Sight    Learning Preferences Written Material;Computer/Internet          Education Topics:  Knowledge Questionnaire Score:  Knowledge Questionnaire Score - 06/24/24 1220       Knowledge Questionnaire Score   Pre Score 20/24          Core Components/Risk Factors/Patient Goals at Admission:  Personal Goals and Risk Factors at Admission - 06/24/24 1220       Core Components/Risk Factors/Patient Goals on Admission    Weight Management Weight Loss    Hypertension Yes    Intervention Provide education on lifestyle modifcations including regular physical activity/exercise, weight management, moderate sodium restriction and increased consumption of fresh fruit, vegetables, and low fat dairy, alcohol moderation, and smoking cessation.;Monitor prescription use compliance.    Expected Outcomes Short Term: Continued assessment and intervention until BP is < 140/67mm HG in hypertensive participants. < 130/40mm HG in hypertensive participants with diabetes, heart failure or chronic kidney disease.;Long Term: Maintenance of blood pressure at goal levels.    Lipids Yes    Intervention Provide education and support for participant on nutrition & aerobic/resistive exercise along with prescribed medications to achieve LDL 70mg ,  HDL >40mg .    Expected Outcomes Short Term: Participant states understanding of desired cholesterol values and is compliant with medications prescribed. Participant is following exercise prescription and nutrition guidelines.;Long Term: Cholesterol controlled with medications as prescribed, with individualized exercise RX and with personalized nutrition plan. Value goals: LDL < 70mg , HDL > 40 mg.  Core Components/Risk Factors/Patient Goals Review:    Core Components/Risk Factors/Patient Goals at Discharge (Final Review):    ITP Comments:  ITP Comments     Row Name 06/24/24 0929           ITP Comments Wilbert Bihari, MD: Medical Director.  Introduction to the Pritikin education program/Intensive cardiac rehab.  Initial orientation packet reviewed with the patient.          Comments: Participant attended orientation for the cardiac rehabilitation program on  06/24/2024  to perform initial intake and exercise walk test. Patient introduced to the Pritikin Program education and orientation packet was reviewed. Completed 6-minute walk test, measurements, initial ITP, and exercise prescription. Vital signs stable. Telemetry-normal sinus rhythm,Sinus N[place] asymptomatic. Resting heart rate noted in the lower 50's will send today's ECG tracings to Dr Ladona for review and continue to monitor the patient. Hadassah Elpidio Quan RN BSN   Service time was from [phone] to [phone].

## 2024-06-24 NOTE — Progress Notes (Signed)
 Cardiac Rehab Medication Review by a Nurse  Does the patient  feel that his/her medications are working for him/her?  yes  Has the patient been experiencing any side effects to the medications prescribed?  no  Does the patient measure his/her own blood pressure or blood glucose at home?  no   Does the patient have any problems obtaining medications due to transportation or finances?   no  Understanding of regimen: good Understanding of indications: good Potential of compliance: excellent    Nurse comments: Linda Cardenas is taking her medications as prescribed and has a good understanding of what her medications are for. Linda Cardenas will bring her inhaler to her first exercise session so that we can update her medication list. Linda Cardenas has a wrist BP cuff/ monitor. Linda Cardenas does not check her blood pressures at home.    Linda Gaw Charmain Diosdado RN 06/24/2024 9:45 AM

## 2024-06-25 ENCOUNTER — Telehealth (HOSPITAL_COMMUNITY): Payer: Self-pay | Admitting: *Deleted

## 2024-06-25 NOTE — Telephone Encounter (Signed)
-----   Message from Gordy Bergamo sent at 06/25/2024 11:58 AM EDT ----- Regarding: RE: No concerns for now. Exercise HR will tell us  the story.  Thank you ----- Message ----- From: Cyrus Hadassah ORN, RN Sent: 06/24/2024   2:24 PM EDT To: Gordy Bergamo, MD  Good afternoon Dr Bergamo Ruffing attended cardiac rehab orientation this morning and was noted to have  resting Sinus Nola she was asymptomatic. heart rate noted in the lower 50's. Please review attached media.  I want to bring this to your attention as the last 12 lead ECG showed her heart rate in the 70's. Joelyn will begin exercise on 06/30/24 and will see you in the office on 07/11/24. Please let me know if you have any concerns or further recommendations. I appreciate your input! Sincerely, [facility]

## 2024-06-29 ENCOUNTER — Other Ambulatory Visit: Payer: Self-pay | Admitting: Nurse Practitioner

## 2024-06-29 DIAGNOSIS — Z951 Presence of aortocoronary bypass graft: Secondary | ICD-10-CM

## 2024-06-29 DIAGNOSIS — I2510 Atherosclerotic heart disease of native coronary artery without angina pectoris: Secondary | ICD-10-CM

## 2024-06-29 DIAGNOSIS — R0609 Other forms of dyspnea: Secondary | ICD-10-CM

## 2024-06-30 ENCOUNTER — Encounter (HOSPITAL_COMMUNITY)
Admission: RE | Admit: 2024-06-30 | Discharge: 2024-06-30 | Disposition: A | Discharge status: Home / Self Care | Source: Ambulatory Visit | Attending: Cardiology | Admitting: Cardiology

## 2024-06-30 DIAGNOSIS — Z48812 Encounter for surgical aftercare following surgery on the circulatory system: Secondary | ICD-10-CM | POA: Diagnosis not present

## 2024-06-30 DIAGNOSIS — Z951 Presence of aortocoronary bypass graft: Secondary | ICD-10-CM

## 2024-06-30 NOTE — Progress Notes (Signed)
 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  Patient's Home Medications on Admission:  Current Outpatient Medications:    acetaminophen  (TYLENOL ) 500 MG tablet, Take 1-2 tablets (500-1,000 mg total) by mouth every 6 (six) hours as needed., Disp: , Rfl:    aspirin  81 MG tablet, Take 81 mg by mouth daily., Disp: , Rfl:    calcium  carbonate (OS-CAL) 600 MG TABS, Take 600 mg by mouth daily., Disp: , Rfl:    Cholecalciferol (VITAMIN Linda Cardenas) 2000 UNITS tablet, Take 2,000 Units by mouth daily., Disp: , Rfl:    clopidogrel  (PLAVIX ) 75 MG tablet, Take 1 tablet (75 mg total) by mouth daily., Disp: 30 tablet, Rfl: 3   ferrous sulfate  325 (65 FE) MG tablet, Take 1 tablet (325 mg total) by mouth daily with breakfast., Disp: 30 tablet, Rfl: 0   fluticasone  furoate-vilanterol (BREO ELLIPTA ) 100-25 MCG/ACT AEPB, Inhale 1 puff into the lungs daily. (Patient taking differently: Inhale 1 puff into the lungs daily as needed (for wheezing and shortness of breath).), Disp: 1 each, Rfl: 5   furosemide  (LASIX ) 40 MG tablet, Take 1 tablet (40 mg total) by mouth daily as needed for fluid or edema. Take Lasix  40 mg daily x 3 days followed by Lasix  40 mg daily as needed for swelling, weight gain, or shortness of breath., Disp: 30 tablet, Rfl: 3   gabapentin  (NEURONTIN ) 300 MG/6ML solution, 2 mL Orally twice a day for 30 days, Disp: , Rfl:    levothyroxine  (SYNTHROID ) 100 MCG tablet, Take 100 mcg by mouth daily before breakfast., Disp: , Rfl:     methocarbamol  (ROBAXIN ) 500 MG tablet, Take 0.5 tablets (250 mg total) by mouth every 8 (eight) hours as needed for muscle spasms. (Patient not taking: Reported on 06/24/2024), Disp: 15 tablet, Rfl: 0   metoprolol  succinate (TOPROL  XL) 25 MG 24 hr tablet, Take 0.5 tablets (12.5 mg total) by mouth daily., Disp: 45 tablet, Rfl: 3   Multiple Vitamins-Minerals (MULTIVITAMIN WITH MINERALS) tablet, Take 1 tablet by mouth daily., Disp: , Rfl:    potassium chloride  SA (KLOR-CON  M) 20 MEQ tablet, Take 1 tablet (20 mEq total) by mouth daily. Take 1 tablet daily for 7 days then take 1 tablet every time you take Lasix , Disp: 30 tablet, Rfl: 0   PULMICORT  FLEXHALER 90 MCG/ACT inhaler, Inhale 1 puff into the lungs 2 (two) times daily., Disp: , Rfl:    rosuvastatin  (CRESTOR ) 40 MG tablet, Take 40 mg by mouth daily., Disp: , Rfl:    sertraline (ZOLOFT) 25 MG tablet, 1 tablet Orally Once a day for 30 day(s) (Patient not taking: Reported on 06/24/2024), Disp: , Rfl:    traMADol  (ULTRAM ) 50 MG tablet, Take 1 tablet (50 mg total) by mouth every 4 (four) hours as needed for moderate pain (pain score 4-6). (Patient not taking: Reported on 06/24/2024), Disp: 30 tablet, Rfl: 0  Past Medical History: Past Medical History:  Diagnosis Date   Arthritis    Back pain     CHRONIC LOW BACK PAIN - GETS INJECTIONS  IN BACK   Constipation    Coronary artery disease    Eczema    Elevated liver enzymes    Gallstones    GERD (gastroesophageal reflux disease)    Hyperlipidemia    Hypertension    Hypothyroidism    Lactose intolerance    Shortness of breath    Thyroid  disease     Tobacco Use: Social History   Tobacco Use  Smoking Status Never  Smokeless Tobacco Never    Labs: Review Flowsheet       Latest Ref Rng & Units 03/14/2024 03/15/2024 03/17/2024  Labs for ITP Cardiac and Pulmonary Rehab  Cholestrol 0 - 200 mg/dL 854  - -  LDL (calc) 0 - 99 mg/dL 67  - -  HDL-C >59 mg/dL 68  - -  Trlycerides <849 mg/dL 48  - -   Hemoglobin J8r 4.8 - 5.6 % 6.0  - -  PH, Arterial 7.35 - 7.45 - 7.47  7.356  7.390  7.328  7.463  7.461  7.449  7.459   PCO2 arterial 32 - 48 mmHg - 39  45.2  40.0  42.2  32.3  36.0  34.1  35.2   Bicarbonate 20.0 - 28.0 mmol/L - 28.4  25.2  24.2  22.4  23.1  25.7  24.6  23.6  24.9   TCO2 22 - 32 mmol/L - - 26  25  24  24  24  27  28  28  26  25  26  26  26    Acid-base deficit 0.0 - 2.0 mmol/L - - 1.0  3.0  1.0   O2 Saturation % - 98.2  99  99  99  100  100  78  100  100     Details       Multiple values from one day are sorted in reverse-chronological order         Capillary Blood Glucose: Lab Results  Component Value Date   GLUCAP 132 (H) 03/19/2024   GLUCAP 101 (H) 03/19/2024   GLUCAP 124 (H) 03/18/2024   GLUCAP 151 (H) 03/18/2024   GLUCAP 136 (H) 03/18/2024     Exercise Target Goals: Exercise Program Goal: Individual exercise prescription set using results from initial 6 min walk test and THRR while considering  patient's activity barriers and safety.   Exercise Prescription Goal: Initial exercise prescription builds to 30-45 minutes a day of aerobic activity, 2-3 days per week.  Home exercise guidelines will be given to patient during program as part of exercise prescription that the participant will acknowledge.  Activity Barriers & Risk Stratification:  Activity Barriers & Cardiac Risk Stratification - 06/24/24 1215       Activity Barriers & Cardiac Risk Stratification   Activity Barriers Arthritis;Joint Problems;Deconditioning;Balance Concerns    Cardiac Risk Stratification High          6 Minute Walk:  6 Minute Walk     Row Name 06/24/24 1213         6 Minute Walk   Phase Initial     Distance 828 feet     Walk Time 6 minutes     # of Rest Breaks 0     MPH 1.7     METS 1.81     RPE 9     Perceived Dyspnea  0     VO2 Peak 6.34     Symptoms No     Resting HR 53 bpm     Resting BP  98/70     Resting Oxygen Saturation  98 %     Exercise Oxygen  Saturation  during 6 min walk 98 %     Max Ex. HR 107 bpm     Max Ex. BP 150/72     2 Minute Post BP 120/70        Oxygen Initial Assessment:   Oxygen Re-Evaluation:   Oxygen Discharge (Final Oxygen Re-Evaluation):   Initial Exercise Prescription:  Initial Exercise Prescription - 06/24/24 1200       Date of Initial Exercise RX and Referring Provider   Date 06/24/24    Referring Provider Linda Bergamo, Linda Cardenas    Expected Discharge Date 09/17/24      NuStep   Level 1    SPM 70    Minutes 25    METs 1.8      Prescription Details   Frequency (times per week) 3    Duration Progress to 30 minutes of continuous aerobic without signs/symptoms of physical distress      Intensity   THRR 40-80% of Max Heartrate 61-122    Ratings of Perceived Exertion 11-13    Perceived Dyspnea 0-4      Progression   Progression Continue progressive overload as per policy without signs/symptoms or physical distress.      Resistance Training   Training Prescription Yes    Weight 2 lbs    Reps 10-15          Perform Capillary Blood Glucose checks as needed.  Exercise Prescription Changes:   Exercise Prescription Changes     Row Name 06/30/24 1400             Response to Exercise   Blood Pressure (Admit) 122/92       Blood Pressure (Exercise) 124/84       Blood Pressure (Exit) 114/66       Heart Rate (Admit) 67 bpm       Heart Rate (Exercise) 80 bpm       Heart Rate (Exit) 51 bpm       Rating of Perceived Exertion (Exercise) 11       Symptoms None       Comments Pt's firsy day in the CRP2 program       Duration Progress to 30 minutes of  aerobic without signs/symptoms of physical distress       Intensity THRR unchanged         Progression   Progression Continue to progress workloads to maintain intensity without signs/symptoms of physical distress.       Average METs 2.2         Resistance Training   Training Prescription Yes       Weight 2 lbs       Reps 10-15       Time  5 Minutes         Interval Training   Interval Training No         NuStep   Level 1       SPM 77       Minutes 20       METs 2.2          Exercise Comments:   Exercise Comments     Row Name 06/30/24 1426           Exercise Comments Pt's first day in the CRP2 program. Pt exercised without complaints.          Exercise Goals and Review:  Exercise Goals     Row Name 06/24/24 1215             Exercise Goals   Increase Physical Activity Yes       Intervention Provide advice, education, support and counseling about physical activity/exercise needs.;Develop an individualized exercise prescription for aerobic and resistive training based on initial evaluation findings, risk stratification, comorbidities and participant's personal goals.       Expected Outcomes Short Term: Attend rehab on a regular basis to increase amount of physical activity.;Long Term: Add in home exercise to make exercise part of routine and to increase amount of physical activity.;Long Term: Exercising regularly at least 3-5 days a week.       Increase Strength and Stamina Yes       Intervention Provide advice, education, support and counseling about physical activity/exercise needs.;Develop an individualized exercise prescription for aerobic and resistive training based on initial evaluation findings, risk stratification, comorbidities and participant's personal goals.       Expected Outcomes Short Term: Increase workloads from initial exercise prescription for resistance, speed, and METs.;Short Term: Perform resistance training exercises routinely during rehab and add in resistance training at home;Long Term: Improve cardiorespiratory fitness, muscular endurance and strength as measured by increased METs and functional capacity ( )       Able to understand and use rate of perceived exertion (RPE) scale Yes       Intervention Provide education and explanation on how to use RPE scale       Expected  Outcomes Short Term: Able to use RPE daily in rehab to express subjective intensity level;Long Term:  Able to use RPE to guide intensity level when exercising independently       Knowledge and understanding of Target Heart Rate Range (THRR) Yes       Intervention Provide education and explanation of THRR including how the numbers were predicted and where they are located for reference       Expected Outcomes Short Term: Able to state/look up THRR;Long Term: Able to use THRR to govern intensity when exercising independently;Short Term: Able to use daily as guideline for intensity in rehab       Understanding of Exercise Prescription Yes       Intervention Provide education, explanation, and written materials on patient's individual exercise prescription       Expected Outcomes Short Term: Able to explain program exercise prescription;Long Term: Able to explain home exercise prescription to exercise independently          Exercise Goals Re-Evaluation :  Exercise Goals Re-Evaluation     Row Name 06/30/24 1424             Exercise Goal Re-Evaluation   Exercise Goals Review Increase Physical Activity;Understanding of Exercise Prescription;Increase Strength and Stamina;Knowledge and understanding of Target Heart Rate Range (THRR);Able to understand and use rate of perceived exertion (RPE) scale       Comments Pt's first day in the CRP2 program. Pt understnads the RPE scale, THRR, and exercise Rx.       Expected Outcomes Will continue to monitor patient and progress exercise workloads as tolerated.          Discharge Exercise Prescription (Final Exercise Prescription Changes):  Exercise Prescription Changes - 06/30/24 1400       Response to Exercise   Blood Pressure (Admit) 122/92    Blood Pressure (Exercise) 124/84    Blood Pressure (Exit) 114/66    Heart Rate (Admit) 67 bpm  Heart Rate (Exercise) 80 bpm    Heart Rate (Exit) 51 bpm    Rating of Perceived Exertion (Exercise) 11     Symptoms None    Comments Pt's firsy day in the CRP2 program    Duration Progress to 30 minutes of  aerobic without signs/symptoms of physical distress    Intensity THRR unchanged      Progression   Progression Continue to progress workloads to maintain intensity without signs/symptoms of physical distress.    Average METs 2.2      Resistance Training   Training Prescription Yes    Weight 2 lbs    Reps 10-15    Time 5 Minutes      Interval Training   Interval Training No      NuStep   Level 1    SPM 77    Minutes 20    METs 2.2          Nutrition:  Target Goals: Understanding of nutrition guidelines, daily intake of sodium 1500mg , cholesterol 200mg , calories 30% from fat and 7% or less from saturated fats, daily to have 5 or more servings of fruits and vegetables.  Biometrics:  Pre Biometrics - 06/24/24 0945       Pre Biometrics   Waist Circumference 43.75 inches    Hip Circumference 47.5 inches    Waist to Hip Ratio 0.92 %    Triceps Skinfold 32 mm    % Body Fat 49.4 %    Grip Strength 13 kg    Flexibility --   No performed due to h/o low back surgery   Single Leg Stand 2.68 seconds           Nutrition Therapy Plan and Nutrition Goals:  Nutrition Therapy & Goals - 06/30/24 1339       Nutrition Therapy   Diet Heart Healthy Diet    Drug/Food Interactions Statins/Certain Fruits      Personal Nutrition Goals   Nutrition Goal Patient to identify strategies for reducing cardiovascular risk by attending the Pritikin education and nutrition series weekly.    Personal Goal #2 Patient to improve diet quality by using the plate method as a guide for meal planning to include lean protein/plant protein, fruits, vegetables, whole grains, nonfat dairy as part of a well-balanced diet.    Comments Willer has medical history of AD s/p CABG x 3, hypertension, hyperlipidemia, hypothyroidism, prediabetes, arthritis, and GERD . LDL is at goal. A1c is in a prediabetic range.  Patient will benefit from participation in intensive cardiac rehab for nutrition education, exercise, and lifestyle modification.      Intervention Plan   Intervention Prescribe, educate and counsel regarding individualized specific dietary modifications aiming towards targeted core components such as weight, hypertension, lipid management, diabetes, heart failure and other comorbidities.;Nutrition handout(s) given to patient.    Expected Outcomes Short Term Goal: Understand basic principles of dietary content, such as calories, fat, sodium, cholesterol and nutrients.;Long Term Goal: Adherence to prescribed nutrition plan.          Nutrition Assessments:  Nutrition Assessments - 06/30/24 1350       Rate Your Plate Scores   Pre Score 70         MEDIFICTS Score Key: >=70 Need to make dietary changes  40-70 Heart Healthy Diet <= 40 Therapeutic Level Cholesterol Diet   Flowsheet Row INTENSIVE CARDIAC REHAB from 06/30/2024 in [facility] for Heart, Vascular, & Lung Health  Picture Your Plate Total Score  on Admission 70   Picture Your Plate Scores: <59 Unhealthy dietary pattern with much room for improvement. 41-50 Dietary pattern unlikely to meet recommendations for good health and room for improvement. 51-60 More healthful dietary pattern, with some room for improvement.  >60 Healthy dietary pattern, although there may be some specific behaviors that could be improved.    Nutrition Goals Re-Evaluation:  Nutrition Goals Re-Evaluation     Row Name 06/30/24 1339             Goals   Current Weight 169 lb 1.5 oz (76.7 kg)       Comment LPa 201.6, A1c 6.0, LDL 67       Expected Outcome Linda Cardenas has medical history of AD s/p CABG x 3, hypertension, hyperlipidemia, hypothyroidism, prediabetes, arthritis, and GERD . LDL is at goal. A1c is in a prediabetic range. Patient will benefit from participation in intensive cardiac rehab for nutrition education, exercise,  and lifestyle modification.          Nutrition Goals Re-Evaluation:  Nutrition Goals Re-Evaluation     Row Name 06/30/24 1339             Goals   Current Weight 169 lb 1.5 oz (76.7 kg)       Comment LPa 201.6, A1c 6.0, LDL 67       Expected Outcome Linda Cardenas has medical history of AD s/p CABG x 3, hypertension, hyperlipidemia, hypothyroidism, prediabetes, arthritis, and GERD . LDL is at goal. A1c is in a prediabetic range. Patient will benefit from participation in intensive cardiac rehab for nutrition education, exercise, and lifestyle modification.          Nutrition Goals Discharge (Final Nutrition Goals Re-Evaluation):  Nutrition Goals Re-Evaluation - 06/30/24 1339       Goals   Current Weight 169 lb 1.5 oz (76.7 kg)    Comment LPa 201.6, A1c 6.0, LDL 67    Expected Outcome Linda Cardenas has medical history of AD s/p CABG x 3, hypertension, hyperlipidemia, hypothyroidism, prediabetes, arthritis, and GERD . LDL is at goal. A1c is in a prediabetic range. Patient will benefit from participation in intensive cardiac rehab for nutrition education, exercise, and lifestyle modification.          Psychosocial: Target Goals: Acknowledge presence or absence of significant depression and/or stress, maximize coping skills, provide positive support system. Participant is able to verbalize types and ability to use techniques and skills needed for reducing stress and depression.  Initial Review & Psychosocial Screening:  Initial Psych Review & Screening - 06/24/24 1312       Initial Review   Current issues with None Identified      Family Dynamics   Good Support System? Yes   Linda Cardenas has her husband, daughter and friends who live in the area fo support. Linda Cardenas's son lives out of state. Linda Cardenas's sister lives in [place].     Barriers   Psychosocial barriers to participate in program There are no identifiable barriers or psychosocial needs.      Screening Interventions   Interventions  Encouraged to exercise;Provide feedback about the scores to participant    Expected Outcomes Long Term Goal: Stressors or current issues are controlled or eliminated.          Quality of Life Scores:  Quality of Life - 06/24/24 1217       Quality of Life   Select Quality of Life      Quality of Life Scores   Health/Function Pre 26.14 %  Socioeconomic Pre 30 %    Psych/Spiritual Pre 30 %    Family Pre 30 %    GLOBAL Pre 28.31 %         Scores of 19 and below usually indicate a poorer quality of life in these areas.  A difference of  2-3 points is a clinically meaningful difference.  A difference of 2-3 points in the total score of the Quality of Life Index has been associated with significant improvement in overall quality of life, self-image, physical symptoms, and general health in studies assessing change in quality of life.  PHQ-9: Review Flowsheet       06/24/2024  Depression screen PHQ 2/9  Decreased Interest 0  Down, Depressed, Hopeless 0  PHQ - 2 Score 0  Altered sleeping 0  Tired, decreased energy 1  Change in appetite 0  Feeling bad or failure about yourself  0  Trouble concentrating 0  Moving slowly or fidgety/restless 1  Suicidal thoughts 0  PHQ-9 Score 2  Difficult doing work/chores Somewhat difficult   Interpretation of Total Score  Total Score Depression Severity:  1-4 = Minimal depression, 5-9 = Mild depression, 10-14 = Moderate depression, 15-19 = Moderately severe depression, 20-27 = Severe depression   Psychosocial Evaluation and Intervention:   Psychosocial Re-Evaluation:  Psychosocial Re-Evaluation     Row Name 06/30/24 1433             Psychosocial Re-Evaluation   Current issues with Current Stress Concerns       Comments Reviewed PHQ9. Linda Cardenas reports feeling tired and has had little energy post surgery. Linda Cardenas says she has been sleeping in her recliner but has recently started sleeping in the bed at least one day a week        Expected Outcomes Linda Cardenas will have controlled or decreased stressors upon completion of cardiac rehab.       Interventions Stress management education;Relaxation education;Encouraged to attend Cardiac Rehabilitation for the exercise       Continue Psychosocial Services  Follow up required by staff         Initial Review   Source of Stress Concerns Chronic Illness       Comments Will continue to monitor and offer support as needed.          Psychosocial Discharge (Final Psychosocial Re-Evaluation):  Psychosocial Re-Evaluation - 06/30/24 1433       Psychosocial Re-Evaluation   Current issues with Current Stress Concerns    Comments Reviewed PHQ9. Linda Cardenas reports feeling tired and has had little energy post surgery. Linda Cardenas says she has been sleeping in her recliner but has recently started sleeping in the bed at least one day a week    Expected Outcomes Linda Cardenas will have controlled or decreased stressors upon completion of cardiac rehab.    Interventions Stress management education;Relaxation education;Encouraged to attend Cardiac Rehabilitation for the exercise    Continue Psychosocial Services  Follow up required by staff      Initial Review   Source of Stress Concerns Chronic Illness    Comments Will continue to monitor and offer support as needed.          Vocational Rehabilitation: Provide vocational rehab assistance to qualifying candidates.   Vocational Rehab Evaluation & Intervention:  Vocational Rehab - 06/24/24 1220       Initial Vocational Rehab Evaluation & Intervention   Assessment shows need for Vocational Rehabilitation No      Vocational Rehab Re-Evaulation   Comments Pt is retired  Education: Education Goals: Education classes will be provided on a weekly basis, covering required topics. Participant will state understanding/return demonstration of topics presented.    Education     Row Name 06/30/24 1200     Education   Cardiac Education  Topics Pritikin   Hospital doctor Education   General Education Metabolic Syndrome and Belly Fat   Instruction Review Code 1- Verbalizes Understanding   Class Start Time 1200      Core Videos: Exercise    Move It!  Clinical staff conducted group or individual video education with verbal and written material and guidebook.  Patient learns the recommended Pritikin exercise program. Exercise with the goal of living a long, healthy life. Some of the health benefits of exercise include controlled diabetes, healthier blood pressure levels, improved cholesterol levels, improved heart and lung capacity, improved sleep, and better body composition. Everyone should speak with their doctor before starting or changing an exercise routine.  Biomechanical Limitations Clinical staff conducted group or individual video education with verbal and written material and guidebook.  Patient learns how biomechanical limitations can impact exercise and how we can mitigate and possibly overcome limitations to have an impactful and balanced exercise routine.  Body Composition Clinical staff conducted group or individual video education with verbal and written material and guidebook.  Patient learns that body composition (ratio of muscle mass to fat mass) is a key component to assessing overall fitness, rather than body weight alone. Increased fat mass, especially visceral belly fat, can put us  at increased risk for metabolic syndrome, type 2 diabetes, heart disease, and even death. It is recommended to combine diet and exercise (cardiovascular and resistance training) to improve your body composition. Seek guidance from your physician and exercise physiologist before implementing an exercise routine.  Exercise Action Plan Clinical staff conducted group or individual video education with verbal and written material and guidebook.  Patient learns  the recommended strategies to achieve and enjoy long-term exercise adherence, including variety, self-motivation, self-efficacy, and positive decision making. Benefits of exercise include fitness, good health, weight management, more energy, better sleep, less stress, and overall well-being.  Medical   Heart Disease Risk Reduction Clinical staff conducted group or individual video education with verbal and written material and guidebook.  Patient learns our heart is our most vital organ as it circulates oxygen, nutrients, white blood cells, and hormones throughout the entire body, and carries waste away. Data supports a plant-based eating plan like the Pritikin Program for its effectiveness in slowing progression of and reversing heart disease. The video provides a number of recommendations to address heart disease.   Metabolic Syndrome and Belly Fat  Clinical staff conducted group or individual video education with verbal and written material and guidebook.  Patient learns what metabolic syndrome is, how it leads to heart disease, and how one can reverse it and keep it from coming back. You have metabolic syndrome if you have 3 of the following 5 criteria: abdominal obesity, high blood pressure, high triglycerides, low HDL cholesterol, and high blood sugar.  Hypertension and Heart Disease Clinical staff conducted group or individual video education with verbal and written material and guidebook.  Patient learns that high blood pressure, or hypertension, is very common in the [place] . Hypertension is largely due to excessive salt intake, but other important risk factors include being overweight, physical inactivity, drinking too much alcohol, smoking, and not eating  enough potassium from fruits and vegetables. High blood pressure is a leading risk factor for heart attack, stroke, congestive heart failure, dementia, kidney failure, and premature death. Long-term effects of excessive salt intake  include stiffening of the arteries and thickening of heart muscle and organ damage. Recommendations include ways to reduce hypertension and the risk of heart disease.  Diseases of Our Time - Focusing on Diabetes Clinical staff conducted group or individual video education with verbal and written material and guidebook.  Patient learns why the best way to stop diseases of our time is prevention, through food and other lifestyle changes. Medicine (such as prescription pills and surgeries) is often only a Band-Aid on the problem, not a long-term solution. Most common diseases of our time include obesity, type 2 diabetes, hypertension, heart disease, and cancer. The Pritikin Program is recommended and has been proven to help reduce, reverse, and/or prevent the damaging effects of metabolic syndrome.  Nutrition   Overview of the Pritikin Eating Plan  Clinical staff conducted group or individual video education with verbal and written material and guidebook.  Patient learns about the Pritikin Eating Plan for disease risk reduction. The Pritikin Eating Plan emphasizes a wide variety of unrefined, minimally-processed carbohydrates, like fruits, vegetables, whole grains, and legumes. Go, Caution, and Stop food choices are explained. Plant-based and lean animal proteins are emphasized. Rationale provided for low sodium intake for blood pressure control, low added sugars for blood sugar stabilization, and low added fats and oils for coronary artery disease risk reduction and weight management.  Calorie Density  Clinical staff conducted group or individual video education with verbal and written material and guidebook.  Patient learns about calorie density and how it impacts the Pritikin Eating Plan. Knowing the characteristics of the food you choose will help you decide whether those foods will lead to weight gain or weight loss, and whether you want to consume more or less of them. Weight loss is usually a side  effect of the Pritikin Eating Plan because of its focus on low calorie-dense foods.  Label Reading  Clinical staff conducted group or individual video education with verbal and written material and guidebook.  Patient learns about the Pritikin recommended label reading guidelines and corresponding recommendations regarding calorie density, added sugars, sodium content, and whole grains.  Dining Out - Part 1  Clinical staff conducted group or individual video education with verbal and written material and guidebook.  Patient learns that restaurant meals can be sabotaging because they can be so high in calories, fat, sodium, and/or sugar. Patient learns recommended strategies on how to positively address this and avoid unhealthy pitfalls.  Facts on Fats  Clinical staff conducted group or individual video education with verbal and written material and guidebook.  Patient learns that lifestyle modifications can be just as effective, if not more so, as many medications for lowering your risk of heart disease. A Pritikin lifestyle can help to reduce your risk of inflammation and atherosclerosis (cholesterol build-up, or plaque, in the artery walls). Lifestyle interventions such as dietary choices and physical activity address the cause of atherosclerosis. A review of the types of fats and their impact on blood cholesterol levels, along with dietary recommendations to reduce fat intake is also included.  Nutrition Action Plan  Clinical staff conducted group or individual video education with verbal and written material and guidebook.  Patient learns how to incorporate Pritikin recommendations into their lifestyle. Recommendations include planning and keeping personal health goals in mind as an important  part of their success.  Healthy Mind-Set    Healthy Minds, Bodies, Hearts  Clinical staff conducted group or individual video education with verbal and written material and guidebook.  Patient learns  how to identify when they are stressed. Video will discuss the impact of that stress, as well as the many benefits of stress management. Patient will also be introduced to stress management techniques. The way we think, act, and feel has an impact on our hearts.  How Our Thoughts Can Heal Our Hearts  Clinical staff conducted group or individual video education with verbal and written material and guidebook.  Patient learns that negative thoughts can cause depression and anxiety. This can result in negative lifestyle behavior and serious health problems. Cognitive behavioral therapy is an effective method to help control our thoughts in order to change and improve our emotional outlook.  Additional Videos:  Exercise    Improving Performance  Clinical staff conducted group or individual video education with verbal and written material and guidebook.  Patient learns to use a non-linear approach by alternating intensity levels and lengths of time spent exercising to help burn more calories and lose more body fat. Cardiovascular exercise helps improve heart health, metabolism, hormonal balance, blood sugar control, and recovery from fatigue. Resistance training improves strength, endurance, balance, coordination, reaction time, metabolism, and muscle mass. Flexibility exercise improves circulation, posture, and balance. Seek guidance from your physician and exercise physiologist before implementing an exercise routine and learn your capabilities and proper form for all exercise.  Introduction to Yoga  Clinical staff conducted group or individual video education with verbal and written material and guidebook.  Patient learns about yoga, a discipline of the coming together of mind, breath, and body. The benefits of yoga include improved flexibility, improved range of motion, better posture and core strength, increased lung function, weight loss, and positive self-image. Yoga's heart health benefits include  lowered blood pressure, healthier heart rate, decreased cholesterol and triglyceride levels, improved immune function, and reduced stress. Seek guidance from your physician and exercise physiologist before implementing an exercise routine and learn your capabilities and proper form for all exercise.  Medical   Aging: Enhancing Your Quality of Life  Clinical staff conducted group or individual video education with verbal and written material and guidebook.  Patient learns key strategies and recommendations to stay in good physical health and enhance quality of life, such as prevention strategies, having an advocate, securing a Health Care Proxy and Power of Attorney, and keeping a list of medications and system for tracking them. It also discusses how to avoid risk for bone loss.  Biology of Weight Control  Clinical staff conducted group or individual video education with verbal and written material and guidebook.  Patient learns that weight gain occurs because we consume more calories than we burn (eating more, moving less). Even if your body weight is normal, you may have higher ratios of fat compared to muscle mass. Too much body fat puts you at increased risk for cardiovascular disease, heart attack, stroke, type 2 diabetes, and obesity-related cancers. In addition to exercise, following the Pritikin Eating Plan can help reduce your risk.  Decoding Lab Results  Clinical staff conducted group or individual video education with verbal and written material and guidebook.  Patient learns that lab test reflects one measurement whose values change over time and are influenced by many factors, including medication, stress, sleep, exercise, food, hydration, pre-existing medical conditions, and more. It is recommended to use the knowledge from  this video to become more involved with your lab results and evaluate your numbers to speak with your doctor.   Diseases of Our Time - Overview  Clinical staff  conducted group or individual video education with verbal and written material and guidebook.  Patient learns that according to the CDC, 50% to 70% of chronic diseases (such as obesity, type 2 diabetes, elevated lipids, hypertension, and heart disease) are avoidable through lifestyle improvements including healthier food choices, listening to satiety cues, and increased physical activity.  Sleep Disorders Clinical staff conducted group or individual video education with verbal and written material and guidebook.  Patient learns how good quality and duration of sleep are important to overall health and well-being. Patient also learns about sleep disorders and how they impact health along with recommendations to address them, including discussing with a physician.  Nutrition  Dining Out - Part 2 Clinical staff conducted group or individual video education with verbal and written material and guidebook.  Patient learns how to plan ahead and communicate in order to maximize their dining experience in a healthy and nutritious manner. Included are recommended food choices based on the type of restaurant the patient is visiting.   Fueling a Banker conducted group or individual video education with verbal and written material and guidebook.  There is a strong connection between our food choices and our health. Diseases like obesity and type 2 diabetes are very prevalent and are in large-part due to lifestyle choices. The Pritikin Eating Plan provides plenty of food and hunger-curbing satisfaction. It is easy to follow, affordable, and helps reduce health risks.  Menu Workshop  Clinical staff conducted group or individual video education with verbal and written material and guidebook.  Patient learns that restaurant meals can sabotage health goals because they are often packed with calories, fat, sodium, and sugar. Recommendations include strategies to plan ahead and to communicate  with the manager, chef, or server to help order a healthier meal.  Planning Your Eating Strategy  Clinical staff conducted group or individual video education with verbal and written material and guidebook.  Patient learns about the Pritikin Eating Plan and its benefit of reducing the risk of disease. The Pritikin Eating Plan does not focus on calories. Instead, it emphasizes high-quality, nutrient-rich foods. By knowing the characteristics of the foods, we choose, we can determine their calorie density and make informed decisions.  Targeting Your Nutrition Priorities  Clinical staff conducted group or individual video education with verbal and written material and guidebook.  Patient learns that lifestyle habits have a tremendous impact on disease risk and progression. This video provides eating and physical activity recommendations based on your personal health goals, such as reducing LDL cholesterol, losing weight, preventing or controlling type 2 diabetes, and reducing high blood pressure.  Vitamins and Minerals  Clinical staff conducted group or individual video education with verbal and written material and guidebook.  Patient learns different ways to obtain key vitamins and minerals, including through a recommended healthy diet. It is important to discuss all supplements you take with your doctor.   Healthy Mind-Set    Smoking Cessation  Clinical staff conducted group or individual video education with verbal and written material and guidebook.  Patient learns that cigarette smoking and tobacco addiction pose a serious health risk which affects millions of people. Stopping smoking will significantly reduce the risk of heart disease, lung disease, and many forms of cancer. Recommended strategies for quitting are covered, including working with  your doctor to develop a successful plan.  Culinary   Becoming a Set designer conducted group or individual video education with  verbal and written material and guidebook.  Patient learns that cooking at home can be healthy, cost-effective, quick, and puts them in control. Keys to cooking healthy recipes will include looking at your recipe, assessing your equipment needs, planning ahead, making it simple, choosing cost-effective seasonal ingredients, and limiting the use of added fats, salts, and sugars.  Cooking - Breakfast and Snacks  Clinical staff conducted group or individual video education with verbal and written material and guidebook.  Patient learns how important breakfast is to satiety and nutrition through the entire day. Recommendations include key foods to eat during breakfast to help stabilize blood sugar levels and to prevent overeating at meals later in the day. Planning ahead is also a key component.  Cooking - Educational psychologist conducted group or individual video education with verbal and written material and guidebook.  Patient learns eating strategies to improve overall health, including an approach to cook more at home. Recommendations include thinking of animal protein as a side on your plate rather than center stage and focusing instead on lower calorie dense options like vegetables, fruits, whole grains, and plant-based proteins, such as beans. Making sauces in large quantities to freeze for later and leaving the skin on your vegetables are also recommended to maximize your experience.  Cooking - Healthy Salads and Dressing Clinical staff conducted group or individual video education with verbal and written material and guidebook.  Patient learns that vegetables, fruits, whole grains, and legumes are the foundations of the Pritikin Eating Plan. Recommendations include how to incorporate each of these in flavorful and healthy salads, and how to create homemade salad dressings. Proper handling of ingredients is also covered. Cooking - Soups and [employer] - Soups and  Desserts Clinical staff conducted group or individual video education with verbal and written material and guidebook.  Patient learns that Pritikin soups and desserts make for easy, nutritious, and delicious snacks and meal components that are low in sodium, fat, sugar, and calorie density, while high in vitamins, minerals, and filling fiber. Recommendations include simple and healthy ideas for soups and desserts.   Overview     The Pritikin Solution Program Overview Clinical staff conducted group or individual video education with verbal and written material and guidebook.  Patient learns that the results of the Pritikin Program have been documented in more than 100 articles published in peer-reviewed journals, and the benefits include reducing risk factors for (and, in some cases, even reversing) high cholesterol, high blood pressure, type 2 diabetes, obesity, and more! An overview of the three key pillars of the Pritikin Program will be covered: eating well, doing regular exercise, and having a healthy mind-set.  WORKSHOPS  Exercise: Exercise Basics: Building Your Action Plan Clinical staff led group instruction and group discussion with PowerPoint presentation and patient guidebook. To enhance the learning environment the use of posters, models and videos may be added. At the conclusion of this workshop, patients will comprehend the difference between physical activity and exercise, as well as the benefits of incorporating both, into their routine. Patients will understand the FITT (Frequency, Intensity, Time, and Type) principle and how to use it to build an exercise action plan. In addition, safety concerns and other considerations for exercise and cardiac rehab will be addressed by the presenter. The purpose of this lesson is to promote  a comprehensive and effective weekly exercise routine in order to improve patients' overall level of fitness.   Managing Heart Disease: Your Path to a  Healthier Heart Clinical staff led group instruction and group discussion with PowerPoint presentation and patient guidebook. To enhance the learning environment the use of posters, models and videos may be added.At the conclusion of this workshop, patients will understand the anatomy and physiology of the heart. Additionally, they will understand how Pritikin's three pillars impact the risk factors, the progression, and the management of heart disease.  The purpose of this lesson is to provide a high-level overview of the heart, heart disease, and how the Pritikin lifestyle positively impacts risk factors.  Exercise Biomechanics Clinical staff led group instruction and group discussion with PowerPoint presentation and patient guidebook. To enhance the learning environment the use of posters, models and videos may be added. Patients will learn how the structural parts of their bodies function and how these functions impact their daily activities, movement, and exercise. Patients will learn how to promote a neutral spine, learn how to manage pain, and identify ways to improve their physical movement in order to promote healthy living. The purpose of this lesson is to expose patients to common physical limitations that impact physical activity. Participants will learn practical ways to adapt and manage aches and pains, and to minimize their effect on regular exercise. Patients will learn how to maintain good posture while sitting, walking, and lifting.  Balance Training and Fall Prevention  Clinical staff led group instruction and group discussion with PowerPoint presentation and patient guidebook. To enhance the learning environment the use of posters, models and videos may be added. At the conclusion of this workshop, patients will understand the importance of their sensorimotor skills (vision, proprioception, and the vestibular system) in maintaining their ability to balance as they age.  Patients will apply a variety of balancing exercises that are appropriate for their current level of function. Patients will understand the common causes for poor balance, possible solutions to these problems, and ways to modify their physical environment in order to minimize their fall risk. The purpose of this lesson is to teach patients about the importance of maintaining balance as they age and ways to minimize their risk of falling.  WORKSHOPS   Nutrition:  Fueling a Ship broker led group instruction and group discussion with PowerPoint presentation and patient guidebook. To enhance the learning environment the use of posters, models and videos may be added. Patients will review the foundational principles of the Pritikin Eating Plan and understand what constitutes a serving size in each of the food groups. Patients will also learn Pritikin-friendly foods that are better choices when away from home and review make-ahead meal and snack options. Calorie density will be reviewed and applied to three nutrition priorities: weight maintenance, weight loss, and weight gain. The purpose of this lesson is to reinforce (in a group setting) the key concepts around what patients are recommended to eat and how to apply these guidelines when away from home by planning and selecting Pritikin-friendly options. Patients will understand how calorie density may be adjusted for different weight management goals.  Mindful Eating  Clinical staff led group instruction and group discussion with PowerPoint presentation and patient guidebook. To enhance the learning environment the use of posters, models and videos may be added. Patients will briefly review the concepts of the Pritikin Eating Plan and the importance of low-calorie dense foods. The concept of mindful eating will be  introduced as well as the importance of paying attention to internal hunger signals. Triggers for non-hunger eating and techniques  for dealing with triggers will be explored. The purpose of this lesson is to provide patients with the opportunity to review the basic principles of the Pritikin Eating Plan, discuss the value of eating mindfully and how to measure internal cues of hunger and fullness using the Hunger Scale. Patients will also discuss reasons for non-hunger eating and learn strategies to use for controlling emotional eating.  Targeting Your Nutrition Priorities Clinical staff led group instruction and group discussion with PowerPoint presentation and patient guidebook. To enhance the learning environment the use of posters, models and videos may be added. Patients will learn how to determine their genetic susceptibility to disease by reviewing their family history. Patients will gain insight into the importance of diet as part of an overall healthy lifestyle in mitigating the impact of genetics and other environmental insults. The purpose of this lesson is to provide patients with the opportunity to assess their personal nutrition priorities by looking at their family history, their own health history and current risk factors. Patients will also be able to discuss ways of prioritizing and modifying the Pritikin Eating Plan for their highest risk areas  Menu  Clinical staff led group instruction and group discussion with PowerPoint presentation and patient guidebook. To enhance the learning environment the use of posters, models and videos may be added. Using menus brought in from [employer], or printed from [employer], patients will apply the Pritikin dining out guidelines that were presented in the [employer] video. Patients will also be able to practice these guidelines in a variety of provided scenarios. The purpose of this lesson is to provide patients with the opportunity to practice hands-on learning of the Pritikin Dining Out guidelines with actual menus and practice scenarios.  Label  Reading Clinical staff led group instruction and group discussion with PowerPoint presentation and patient guidebook. To enhance the learning environment the use of posters, models and videos may be added. Patients will review and discuss the Pritikin label reading guidelines presented in Pritikin's Label Reading Educational series video. Using fool labels brought in from local grocery stores and markets, patients will apply the label reading guidelines and determine if the packaged food meet the Pritikin guidelines. The purpose of this lesson is to provide patients with the opportunity to review, discuss, and practice hands-on learning of the Pritikin Label Reading guidelines with actual packaged food labels. Cooking School  Pritikin's [employer] are designed to teach patients ways to prepare quick, simple, and affordable recipes at home. The importance of nutrition's role in chronic disease risk reduction is reflected in its emphasis in the overall Pritikin program. By learning how to prepare essential core Pritikin Eating Plan recipes, patients will increase control over what they eat; be able to customize the flavor of foods without the use of added salt, sugar, or fat; and improve the quality of the food they consume. By learning a set of core recipes which are easily assembled, quickly prepared, and affordable, patients are more likely to prepare more healthy foods at home. These workshops focus on convenient breakfasts, simple entres, side dishes, and desserts which can be prepared with minimal effort and are consistent with nutrition recommendations for cardiovascular risk reduction. Cooking [employer] are taught by a Armed forces logistics/support/administrative officer (RD) who has been trained by the [employer]. The chef or RD has a clear  understanding of the importance of minimizing - if not completely eliminating - added fat, sugar, and sodium in recipes. Throughout the series of Cooking  School Workshop sessions, patients will learn about healthy ingredients and efficient methods of cooking to build confidence in their capability to prepare    Cooking School weekly topics:  Adding Flavor- Sodium-Free  Fast and Healthy Breakfasts  Powerhouse Plant-Based Proteins  Satisfying Salads and Dressings  Simple Sides and Sauces  International Cuisine-Spotlight on the [employer] Zones  Delicious Desserts  Savory Soups  [employer] - Meals in a Astronomer Appetizers and Snacks  Comforting Weekend Breakfasts  One-Pot Wonders   Fast Evening Meals  Landscape architect Your Pritikin Plate  WORKSHOPS   Healthy Mindset (Psychosocial):  Focused Goals, Sustainable Changes Clinical staff led group instruction and group discussion with PowerPoint presentation and patient guidebook. To enhance the learning environment the use of posters, models and videos may be added. Patients will be able to apply effective goal setting strategies to establish at least one personal goal, and then take consistent, meaningful action toward that goal. They will learn to identify common barriers to achieving personal goals and develop strategies to overcome them. Patients will also gain an understanding of how our mind-set can impact our ability to achieve goals and the importance of cultivating a positive and growth-oriented mind-set. The purpose of this lesson is to provide patients with a deeper understanding of how to set and achieve personal goals, as well as the tools and strategies needed to overcome common obstacles which may arise along the way.  From Head to Heart: The Power of a Healthy Outlook  Clinical staff led group instruction and group discussion with PowerPoint presentation and patient guidebook. To enhance the learning environment the use of posters, models and videos may be added. Patients will be able to recognize and describe the impact of emotions and mood on physical  health. They will discover the importance of self-care and explore self-care practices which may work for them. Patients will also learn how to utilize the 4 C's to cultivate a healthier outlook and better manage stress and challenges. The purpose of this lesson is to demonstrate to patients how a healthy outlook is an essential part of maintaining good health, especially as they continue their cardiac rehab journey.  Healthy Sleep for a Healthy Heart Clinical staff led group instruction and group discussion with PowerPoint presentation and patient guidebook. To enhance the learning environment the use of posters, models and videos may be added. At the conclusion of this workshop, patients will be able to demonstrate knowledge of the importance of sleep to overall health, well-being, and quality of life. They will understand the symptoms of, and treatments for, common sleep disorders. Patients will also be able to identify daytime and nighttime behaviors which impact sleep, and they will be able to apply these tools to help manage sleep-related challenges. The purpose of this lesson is to provide patients with a general overview of sleep and outline the importance of quality sleep. Patients will learn about a few of the most common sleep disorders. Patients will also be introduced to the concept of "sleep hygiene," and discover ways to self-manage certain sleeping problems through simple daily behavior changes. Finally, the workshop will motivate patients by clarifying the links between quality sleep and their goals of heart-healthy living.   Recognizing and Reducing Stress Clinical staff led group instruction and group discussion with PowerPoint presentation and patient guidebook. To enhance  the learning environment the use of posters, models and videos may be added. At the conclusion of this workshop, patients will be able to understand the types of stress reactions, differentiate between acute and chronic  stress, and recognize the impact that chronic stress has on their health. They will also be able to apply different coping mechanisms, such as reframing negative self-talk. Patients will have the opportunity to practice a variety of stress management techniques, such as deep abdominal breathing, progressive muscle relaxation, and/or guided imagery.  The purpose of this lesson is to educate patients on the role of stress in their lives and to provide healthy techniques for coping with it.  Learning Barriers/Preferences:  Learning Barriers/Preferences - 06/24/24 1221       Learning Barriers/Preferences   Learning Barriers Sight    Learning Preferences Written Material;Computer/Internet          Education Topics:  Knowledge Questionnaire Score:  Knowledge Questionnaire Score - 06/24/24 1220       Knowledge Questionnaire Score   Pre Score 20/24          Core Components/Risk Factors/Patient Goals at Admission:  Personal Goals and Risk Factors at Admission - 06/24/24 1220       Core Components/Risk Factors/Patient Goals on Admission    Weight Management Weight Loss    Hypertension Yes    Intervention Provide education on lifestyle modifcations including regular physical activity/exercise, weight management, moderate sodium restriction and increased consumption of fresh fruit, vegetables, and low fat dairy, alcohol moderation, and smoking cessation.;Monitor prescription use compliance.    Expected Outcomes Short Term: Continued assessment and intervention until BP is < 140/48mm HG in hypertensive participants. < 130/52mm HG in hypertensive participants with diabetes, heart failure or chronic kidney disease.;Long Term: Maintenance of blood pressure at goal levels.    Lipids Yes    Intervention Provide education and support for participant on nutrition & aerobic/resistive exercise along with prescribed medications to achieve LDL 70mg , HDL >40mg .    Expected Outcomes Short Term:  Participant states understanding of desired cholesterol values and is compliant with medications prescribed. Participant is following exercise prescription and nutrition guidelines.;Long Term: Cholesterol controlled with medications as prescribed, with individualized exercise RX and with personalized nutrition plan. Value goals: LDL < 70mg , HDL > 40 mg.          Core Components/Risk Factors/Patient Goals Review:   Goals and Risk Factor Review     Row Name 06/30/24 1437             Core Components/Risk Factors/Patient Goals Review   Personal Goals Review Weight Management/Obesity;Hypertension;Lipids       Review Linda Cardenas started cardiac rehab on 06/30/24. Linda Cardenas did well with exercise. Vital signs were stable. Linda Cardenas is somewhat deconditioned.       Expected Outcomes Tanee will continue to participate in cardiac rehab for exercise, nutrition and lifestyle modificaitons          Core Components/Risk Factors/Patient Goals at Discharge (Final Review):   Goals and Risk Factor Review - 06/30/24 1437       Core Components/Risk Factors/Patient Goals Review   Personal Goals Review Weight Management/Obesity;Hypertension;Lipids    Review Linda Cardenas started cardiac rehab on 06/30/24. Linda Cardenas did well with exercise. Vital signs were stable. Linda Cardenas is somewhat deconditioned.    Expected Outcomes Linda Cardenas will continue to participate in cardiac rehab for exercise, nutrition and lifestyle modificaitons          ITP Comments:  ITP Comments     Row Name  06/24/24 9070 06/30/24 1432         ITP Comments Linda Bihari, Linda Cardenas: Medical Director.  Introduction to the [facility] education program/Intensive cardiac rehab.  Initial orientation packet reviewed with the patient. 30 Day ITP Review. Linda Cardenas started cardiac rehab on 06/30/24 Linda Cardenas did well with exercise for her fitness level         Comments: Pt started cardiac rehab today.  Pt tolerated light exercise without difficulty. VSS, telemetry-Sinus Brady,  Sinus Rhythm, asymptomatic. Linda Cardenas is somewhat deconditioned.  Medication list reconciled. Pt denies barriers to medicaiton compliance.  PSYCHOSOCIAL ASSESSMENT:  PHQ-2. Pt exhibits positive coping skills, hopeful outlook with supportive family. No psychosocial needs identified at this time, no psychosocial interventions necessary.    Pt enjoys cooking, cleaning and sewing.   Pt oriented to exercise equipment and routine.    Understanding verbalized. Hadassah Elpidio Quan RN BSN

## 2024-07-01 ENCOUNTER — Telehealth (HOSPITAL_COMMUNITY): Payer: Self-pay

## 2024-07-01 NOTE — Telephone Encounter (Signed)
 Spoke to patient regarding insurance, she understands that her CHARON is no longer active and says she is going to work on Education officer, environmental B as she already has Medicare A. Patient states she will call us  by Friday and update us  on her insurance status. Patient understands she will not be able to attend class until she has some kind of insurance other than Medicare A as that will not cover cardiac rehab.  Cancelling her 11:45 CR class for tomorrow 8/13, may need to cancel Friday class 8/15 depending on whether or not she has obtained new insurance.

## 2024-07-01 NOTE — Telephone Encounter (Signed)
 Attempted to call patient regarding possible insurance change, it appears her BCBS coverage is no longer active and her only other insurance on file is Medicare A which will not cover cardiac rehab. Patient did not answer, left message for her to call us  back.  If she comes to class tomorrow please have her stop by the front desk before she participates in exercise/education.

## 2024-07-02 ENCOUNTER — Encounter (HOSPITAL_COMMUNITY)

## 2024-07-02 ENCOUNTER — Telehealth (HOSPITAL_COMMUNITY): Payer: Self-pay

## 2024-07-02 NOTE — Telephone Encounter (Signed)
 Verified insurance through [employer] (not [employer]), patient's individual plan with ID# [id number] is not active since 6/01 due to her work status. The plan is under union health insurance and has to be billed in a specific order. She is actively covered under her husband's plan but according to the representative Edwardo B., 07/02/2024 @ 11:27am) her inactive plan under her name must be billed 1st and then the plan she is on with her husband must be billed 2nd even though the primary insurance is not active and will automatically deny it.  Patient's husband's insurance has been added as secondary in her chart.  Called patient's daughter to convey this information. Patient is able to return to class on Monday 8/18.

## 2024-07-04 ENCOUNTER — Encounter (HOSPITAL_COMMUNITY)

## 2024-07-07 ENCOUNTER — Encounter (HOSPITAL_COMMUNITY): Admission: RE | Admit: 2024-07-07 | Source: Ambulatory Visit

## 2024-07-07 ENCOUNTER — Telehealth (HOSPITAL_COMMUNITY): Payer: Self-pay

## 2024-07-07 NOTE — Telephone Encounter (Signed)
 Patient c/o for 11:45 CR class, had an appt today. Patient had questions about her insurance coverage, stated she is getting Medicare B and was not sure if she could come to class before it starts. Explained to patient that her insurance through her husband is still good and she can continue classes before her Medicare B begins. I had gone over this information with her daughter (DPR on file) last week, patient is struggling to understand.  Patient will return to class Monday 8/25.

## 2024-07-09 ENCOUNTER — Ambulatory Visit: Admitting: Pulmonary Disease

## 2024-07-09 ENCOUNTER — Encounter (HOSPITAL_COMMUNITY)

## 2024-07-09 VITALS — BP 98/60 | HR 55

## 2024-07-09 DIAGNOSIS — I5030 Unspecified diastolic (congestive) heart failure: Secondary | ICD-10-CM

## 2024-07-09 DIAGNOSIS — I2510 Atherosclerotic heart disease of native coronary artery without angina pectoris: Secondary | ICD-10-CM

## 2024-07-09 DIAGNOSIS — R0602 Shortness of breath: Secondary | ICD-10-CM | POA: Diagnosis not present

## 2024-07-09 MED ORDER — FLUTICASONE-SALMETEROL 250-50 MCG/ACT IN AEPB: 1.0000 | INHALATION_SPRAY | Freq: Two times a day (BID) | RESPIRATORY_TRACT | 5 refills | Status: AC

## 2024-07-09 NOTE — Patient Instructions (Signed)
 We will schedule you for a home sleep study to rule out significant sleep apnea  I will suggest to take your Lasix  on a regular basis to get rid of any swelling in your legs as swelling in your legs will also suggest that there may still be some pulmonary congestion as well  Prescription for Wixela sent to pharmacy for you to be used twice a day  Continue graded exercises as tolerated  Tentative follow-up in about 3 months  We will call you once we have results from the sleep study

## 2024-07-09 NOTE — Progress Notes (Unsigned)
 Subjective:    Patient ID: Linda Cardenas, female    DOB: [birth year], 68 y.o.   MRN: [id number]  Shortness of breath, wheezing  Uses inhalers intermittently  Was prescribed Breo at the last visit, [name] was too expensive  She does not feel limited with activities of daily living but does wheeze with activity sometimes  Has no underlying lung disease No history of smoking No significant exposure to secondhand smoke  Usually able to tolerate mild to moderate activity Wheezes on a daily basis  Denies any other associated symptoms  Chronic back pain     Past Medical History:  Diagnosis Date  . Arthritis   . Back pain     CHRONIC LOW BACK PAIN - GETS INJECTIONS IN BACK  . Constipation   . Coronary artery disease   . Eczema   . Elevated liver enzymes   . Gallstones   . GERD (gastroesophageal reflux disease)   . Hyperlipidemia   . Hypertension   . Hypothyroidism   . Lactose intolerance   . Shortness of breath   . Thyroid  disease    Social History   Socioeconomic History  . Marital status: Married    Spouse name: Not on file  . Number of children: 2  . Years of education: 16  . Highest education level: Bachelor's degree (e.g., BA, AB, BS)  Occupational History  . Occupation: Retired  Tobacco Use  . Smoking status: Never  . Smokeless tobacco: Never  Vaping Use  . Vaping status: Never Used  Substance and Sexual Activity  . Alcohol use: No  . Drug use: No  . Sexual activity: Not on file  Other Topics Concern  . Not on file  Social History Narrative  . Not on file   Social Drivers of Health   Financial Resource Strain: Not on file  Food Insecurity: No Food Insecurity (03/14/2024)   Hunger Vital Sign   . Worried About Programme researcher, broadcasting/film/video in the Last Year: Never true   . Ran Out of Food in the Last Year: Never true  Transportation Needs: No Transportation Needs (03/14/2024)   PRAPARE - Transportation   . Lack of Transportation (Medical): No   . Lack of  Transportation (Non-Medical): No  Physical Activity: Not on file  Stress: Not on file  Social Connections: Socially Integrated (03/14/2024)   Social Connection and Isolation Panel   . Frequency of Communication with Friends and Family: Three times a week   . Frequency of Social Gatherings with Friends and Family: Once a week   . Attends Religious Services: More than 4 times per year   . Active Member of Clubs or Organizations: No   . Attends Banker Meetings: More than 4 times per year   . Marital Status: Married  Catering manager Violence: Unknown (03/14/2024)   Humiliation, Afraid, Rape, and Kick questionnaire   . Fear of Current or Ex-Partner: Not on file   . Emotionally Abused: No   . Physically Abused: No   . Sexually Abused: No   Family History  Problem Relation Age of Onset  . Heart failure Mother   . Heart disease Father   . Heart attack Father   . CAD Brother    Review of Systems  Constitutional:  Negative for fever and unexpected weight change.  HENT:  Negative for congestion, dental problem, ear pain, nosebleeds, postnasal drip, rhinorrhea, sinus pressure, sneezing, sore throat and trouble swallowing.   Eyes:  Negative for redness and  itching.  Respiratory:  Positive for wheezing. Negative for cough, chest tightness and shortness of breath.   Cardiovascular:  Negative for palpitations.  Genitourinary:  Negative for dysuria.  Skin:  Negative for rash.  Allergic/Immunologic: Negative.       Objective:   Physical Exam Constitutional:      Appearance: Normal appearance.  HENT:     Head: Normocephalic.  Eyes:     Pupils: Pupils are equal, round, and reactive to light.  Cardiovascular:     Rate and Rhythm: Normal rate and regular rhythm.     Pulses: Normal pulses.     Heart sounds: Normal heart sounds. No murmur heard.    No friction rub.  Pulmonary:     Effort: Pulmonary effort is normal. No respiratory distress.     Breath sounds: Normal breath  sounds. No stridor. No wheezing or rhonchi.  Musculoskeletal:     Cervical back: No rigidity or tenderness.  Neurological:     Mental Status: She is alert.  Psychiatric:        Mood and Affect: Mood normal.    Vitals:   07/09/24 1126  BP: 98/60  Pulse: (!) 55  SpO2: 97%   Chest x-ray today shows no acute infiltrate, kyphosis  -Reviewed with patient today  Assessment & Plan:  .  Shortness of breath -Not really significant at present  .  Wheezing   Not able to afford the [facility]  She will be traveling to [place] for a few months  Prescription for air duo sent to pharmacy  I encouraged her to give us  a call if not able to afford the AirDuo and we can look for other alternatives  Follow-up in about a year

## 2024-07-10 ENCOUNTER — Encounter: Payer: Self-pay | Admitting: Pulmonary Disease

## 2024-07-11 ENCOUNTER — Encounter: Payer: Self-pay | Admitting: Cardiology

## 2024-07-11 ENCOUNTER — Other Ambulatory Visit (HOSPITAL_COMMUNITY): Payer: Self-pay

## 2024-07-11 ENCOUNTER — Ambulatory Visit: Attending: Cardiology | Admitting: Cardiology

## 2024-07-11 ENCOUNTER — Encounter (HOSPITAL_COMMUNITY)

## 2024-07-11 VITALS — BP 110/78 | HR 57 | Ht <= 58 in | Wt 166.6 lb

## 2024-07-11 DIAGNOSIS — E782 Mixed hyperlipidemia: Secondary | ICD-10-CM

## 2024-07-11 DIAGNOSIS — R7303 Prediabetes: Secondary | ICD-10-CM

## 2024-07-11 DIAGNOSIS — Z951 Presence of aortocoronary bypass graft: Secondary | ICD-10-CM

## 2024-07-11 DIAGNOSIS — E7841 Elevated Lipoprotein(a): Secondary | ICD-10-CM | POA: Diagnosis not present

## 2024-07-11 DIAGNOSIS — I2510 Atherosclerotic heart disease of native coronary artery without angina pectoris: Secondary | ICD-10-CM

## 2024-07-11 DIAGNOSIS — R600 Localized edema: Secondary | ICD-10-CM

## 2024-07-11 MED ORDER — NITROGLYCERIN 0.4 MG SL SUBL
0.4000 mg | SUBLINGUAL_TABLET | SUBLINGUAL | 1 refills | Status: AC | PRN
  Filled 2024-07-11: qty 25, 10d supply, fill #0

## 2024-07-11 MED ORDER — CLOPIDOGREL BISULFATE 75 MG PO TABS
75.0000 mg | ORAL_TABLET | Freq: Every day | ORAL | 3 refills | Status: AC
  Filled 2024-07-11: qty 90, 90d supply, fill #0

## 2024-07-11 MED ORDER — LOSARTAN POTASSIUM 25 MG PO TABS
12.5000 mg | ORAL_TABLET | Freq: Every evening | ORAL | 3 refills | Status: AC
  Filled 2024-07-11: qty 45, 90d supply, fill #0

## 2024-07-11 NOTE — Progress Notes (Signed)
 Cardiology Office Note:  .   Date:  07/12/2024  ID:  Linda Cardenas, DOB 02/29/1956, MRN [id number] PCP: Dayna Motto, DO  [facility] Providers Cardiologist:  Gordy Bergamo, MD   History of Present Illness: .   Linda Cardenas is a 68 y.o. female  with history of hyperlipidemia, markedly elevated Lp(a), hyperglycemia,hypothyroidism and goiter, chronic back pain,severe degenerative disc disease and chronic back pain presented with NSTEMI on 03/14/2023 underwent cardiac catheterization the following morning revealing high-grade distal left main 70% stenosis and a large D1 99% stenosis and was recommended CABG on 03/17/2024.  Patient's daughter is concerned that patient has not increased her physical activity.  Patient has occasional leg edema for which she takes furosemide .  No change in weight.  No PND or orthopnea.  Has not had any further chest discomfort.  Cardiac Studies relevent.    CXR 05/13/24: No active cardiopulmonary disease.   Cardiac Catheterization 03/14/24:    ECHOCARDIOGRAM COMPLETE 03/14/2024  Normal LVEF at 60 to 65% without wall motion abnormality.  Pre-CABG Dopplers 03/15/2024: Normal bilateral carotid arteries and normal ABI with triphasic waveforms.     Discussed the use of AI scribe software for clinical note transcription with the patient, who gave verbal consent to proceed.  History of Present Illness Linda Cardenas is a 68 year old female with coronary artery disease who presents for follow-up after CABG surgery. She is accompanied by her daughter, who is also her primary caregiver.  She is in the recovery phase following CABG surgery and is concerned about her heart rate, which occasionally drops into the 40s, accompanied by significant fatigue. Fatigue occurs with minimal exertion, such as washing dishes, and she is apprehensive about walking alone due to fear of falling. No current chest pain is present.  She experiences persistent shortness of breath and has  been evaluated by a pulmonologist, who prescribed an inhaler. A home sleep apnea test is planned due to suspected significant sleep apnea. She sleeps well at night but experiences fatigue and shortness of breath with exertion. She has a history of reactive airway disease and wheezing.  Her dietary habits include snacking on crackers and cookies and often skipping meals. There is concern about her weight and dietary habits impacting her health. She experiences leg edema and manages it with Lasix  as needed.  She is currently taking Lasix  as needed and has been on metoprolol , which her daughter advised her to skip due to low heart rate, but she preferred to wait for medical advice. She is also on sertraline for anxiety, which she started over a month ago. Her past medical history includes a high LPA level of 201, and she has been on Crestor  for cholesterol management. Her daughter is concerned about her prediabetes status noted during surgical evaluations.   Labs   Lab Results  Component Value Date   CHOL 155 07/11/2024   HDL 71 07/11/2024   LDLCALC 70 07/11/2024   TRIG 73 07/11/2024   CHOLHDL 2.2 07/11/2024   Lipoprotein (a)  Date/Time Value Ref Range Status  03/15/2024 02:23 AM 201.6 (H) <75.0 nmol/L Final    Comment:    (NOTE) Note:  Values greater than or equal to 75.0 nmol/L may       indicate an independent risk factor for CHD,       but must be evaluated with caution when applied       to non-Caucasian populations due to the       influence of genetic factors  on Lp(a) across       ethnicities. Performed At: [facility] [address] [place], [place] [id number] Jennette Shorter MD Ey:[id number]     Recent Labs    03/18/24 1616 03/19/24 0408 03/20/24 0356 04/08/24 1223 07/11/24 1109  NA 135 137 138 140 141  K 3.9 3.7 3.8 5.0 4.2  CL 99 102 103 102 103  CO2 27 29 27 25 24   GLUCOSE 145* 98 95 91 87  BUN 7* 8 8 7* 8  CREATININE 0.56 0.44 0.48 0.60 0.58  CALCIUM   8.8* 8.7* 8.9 10.0 10.0  GFRNONAA >60 >60 >60  --   --     Lab Results  Component Value Date   ALT 28 07/11/2024   AST 30 07/11/2024   ALKPHOS 57 07/11/2024   BILITOT 0.5 07/11/2024      Latest Ref Rng & Units 07/11/2024   11:09 AM 04/08/2024   12:23 PM 03/20/2024    3:56 AM  CBC  WBC 3.4 - 10.8 x10E3/uL 5.5  7.1  7.0   Hemoglobin 11.1 - 15.9 g/dL 88.1  88.9  7.3   Hematocrit 34.0 - 46.6 % 36.7  35.1  22.2   Platelets 150 - 450 x10E3/uL 219  373  111    Lab Results  Component Value Date   HGBA1C 6.0 (H) 03/14/2024    Lab Results  Component Value Date   TSH 0.953 03/14/2024    ROS  Review of Systems  Cardiovascular:  Positive for leg swelling. Negative for chest pain and dyspnea on exertion.   Physical Exam:   VS:  BP 110/78 (BP Location: Right Arm, Patient Position: Sitting)   Pulse (!) 57   Ht 4' 9 (1.448 m)   Wt 166 lb 9.6 oz (75.6 kg)   SpO2 97%   BMI 36.05 kg/m    Wt Readings from Last 3 Encounters:  07/11/24 166 lb 9.6 oz (75.6 kg)  06/24/24 169 lb 1.5 oz (76.7 kg)  05/13/24 164 lb (74.4 kg)    BP Readings from Last 3 Encounters:  07/11/24 110/78  07/09/24 98/60  06/24/24 98/70   Physical Exam Constitutional:      Appearance: She is morbidly obese.  Neck:     Vascular: No carotid bruit or JVD.  Cardiovascular:     Rate and Rhythm: Normal rate and regular rhythm.     Pulses: Intact distal pulses.     Heart sounds: Normal heart sounds. No murmur heard.    No gallop.  Pulmonary:     Effort: Pulmonary effort is normal.     Breath sounds: Normal breath sounds.  Abdominal:     General: Bowel sounds are normal.     Palpations: Abdomen is soft.  Musculoskeletal:     Right lower leg: Edema (Chronic 1-2+ pitting edema below knee) present.     Left lower leg: Edema (Chronic 1-2+ pitting edema below knee) present.    EKG:         ASSESSMENT AND PLAN: .      ICD-10-CM   1. Coronary artery disease involving native coronary artery of native heart  without angina pectoris  I25.10 clopidogrel  (PLAVIX ) 75 MG tablet    nitroGLYCERIN  (NITROSTAT ) 0.4 MG SL tablet    losartan  (COZAAR ) 25 MG tablet    CBC    Comp Met (CMET)    Lipid Profile    AMB Referral to [facility]    2. Elevated Lp(a)  E78.41 Lipid Profile  AMB Referral to [facility] Pharm[facility]    3. Mixed hyperlipidemia  E78.2 Comp Met (CMET)    Lipid Profile    AMB Referral to [facility] Pharm[facility]    4. Prediabetes  R73.03 Comp Met (CMET)    5. Leg edema  R60.0 CBC    Comp Met (CMET)    6. CABG x 3 (LIMA-LAD, SVG-OM, SVG-diagonal) on 03/17/2024  Z95.1 clopidogrel  (PLAVIX ) 75 MG tablet    CBC    Comp Met (CMET)     Assessment & Plan Coronary artery disease, post-CABG Post-CABG status with no current angina. Heart is well-healed with good blood flow. Emphasis on lifestyle changes to prevent recurrence of blockages. Without lifestyle changes, bypass grafts may close in 1-3 years. - Stop aspirin  - Continue Plavix  for 90 days - Prescribe sublingual nitroglycerin  for emergency chest pain - Encourage physical activity and lifestyle changes  Mixed hyperlipidemia with markedly elevated Lp(a) Markedly elevated Lp(a) at 201. LDL was 70. Requires aggressive management to prevent further cardiovascular events, goal LDL <55. - Continue Crestor  - Refer to clinical pharmacist for Repatha to lower LDL - Order lipid profile  Morbid obesity and prediabetes Morbid obesity and prediabetes. Discussed potential benefits of weight loss and lifestyle changes. Consideration for Ozempic for weight loss and management of prediabetes. - Refer to clinical pharmacist to evaluate for Ozempic - Order A1c - Extensive discussion regarding lifestyle modification, patient's daughter who is a nurse present.  All charts and labs reviewed since hospitalization and discharge.  Reactive airway disease Reactive airway disease with bronchospasm. Wheezing present, but lung sounds are clear. Previously  prescribed inhaler by pulmonologist.  Suspected obstructive sleep apnea Suspected obstructive sleep apnea. Pulmonologist has ordered a home sleep study. Discussed the potential impact of untreated sleep apnea on cardiovascular health.  Peripheral edema, intermittent Intermittent peripheral edema likely related to dietary salt intake. Edema and shortness of breath may be exacerbated by dietary choices. - Advise reduction of dietary salt intake - Continue Lasix  as needed  Bradycardia, resolved with medication change Bradycardia resolved by discontinuing metoprolol . No need for alternative beta blocker due to low heart rate. Lifestyle changes, not medication, are crucial to prevent future cardiac events. - Stop metoprolol  - Start losartan  12.5 mg once daily   Follow up: 6 months  Signed,  Gordy Bergamo, MD, [facility] 07/12/2024, 8:29 AM [facility] [address] [place], [place] [address] Phone: [phone]. Fax:  [phone]

## 2024-07-11 NOTE — Patient Instructions (Signed)
 Medication Instructions:  Your physician has recommended you make the following change in your medication: Stop aspirin  Stop metoprolol  Start Losartan  12.5 mg by mouth daily  A prescription for nitroglycerin  to use as needed has been sent to your pharmacy  *If you need a refill on your cardiac medications before your next appointment, please call your pharmacy*  Lab Work: Have lab work drawn today in the lab on the first floor-lipids, CBC, CMP If you have labs (blood work) drawn today and your tests are completely normal, you will receive your results only by: MyChart Message (if you have MyChart) OR A paper copy in the mail If you have any lab test that is abnormal or we need to change your treatment, we will call you to review the results.  Testing/Procedures: none  Follow-Up: At [facility] you and your health needs are our priority.  As part of our continuing mission to provide you with exceptional heart care, our providers are all part of one team.  This team includes your primary Cardiologist (physician) and Advanced Practice Providers or APPs (Physician Assistants and Nurse Practitioners) who all work together to provide you with the care you need, when you need it.  Your next appointment:   6 month(s)  Provider:   Gordy Bergamo, MD    We recommend signing up for the patient portal called MyChart.  Sign up information is provided on this After Visit Summary.  MyChart is used to connect with patients for Virtual Visits (Telemedicine).  Patients are able to view lab/test results, encounter notes, upcoming appointments, etc.  Non-urgent messages can be sent to your provider as well.   To learn more about what you can do with MyChart, go to [email or web address].   Other Instructions You have been referred to see the pharmacist in our office.  Please schedule new patient appointment

## 2024-07-12 ENCOUNTER — Ambulatory Visit: Payer: Self-pay | Admitting: Cardiology

## 2024-07-12 LAB — LIPID PANEL
Chol/HDL Ratio: 2.2 ratio (ref 0.0–4.4)
Cholesterol, Total: 155 mg/dL (ref 100–199)
HDL: 71 mg/dL (ref >39)
LDL Chol Calc (NIH): 70 mg/dL (ref 0–99)
Triglycerides: 73 mg/dL (ref 0–149)
VLDL Cholesterol Cal: 14 mg/dL (ref 5–40)

## 2024-07-12 LAB — CBC
Hematocrit: 36.7 % (ref 34.0–46.6)
Hemoglobin: 11.8 g/dL (ref 11.1–15.9)
MCH: 27.9 pg (ref 26.6–33.0)
MCHC: 32.2 g/dL (ref 31.5–35.7)
MCV: 87 fL (ref 79–97)
Platelets: 219 x10E3/uL (ref 150–450)
RBC: 4.23 x10E6/uL (ref 3.77–5.28)
RDW: 14 % (ref 11.7–15.4)
WBC: 5.5 x10E3/uL (ref 3.4–10.8)

## 2024-07-12 LAB — COMPREHENSIVE METABOLIC PANEL WITH GFR
ALT: 28 IU/L (ref 0–32)
AST: 30 IU/L (ref 0–40)
Albumin: 4.2 g/dL (ref 3.9–4.9)
Alkaline Phosphatase: 57 IU/L (ref 44–121)
BUN/Creatinine Ratio: 14 (ref 12–28)
BUN: 8 mg/dL (ref 8–27)
Bilirubin Total: 0.5 mg/dL (ref 0.0–1.2)
CO2: 24 mmol/L (ref 20–29)
Calcium: 10 mg/dL (ref 8.7–10.3)
Chloride: 103 mmol/L (ref 96–106)
Creatinine, Ser: 0.58 mg/dL (ref 0.57–1.00)
Globulin, Total: 2.9 g/dL (ref 1.5–4.5)
Glucose: 87 mg/dL (ref 70–99)
Potassium: 4.2 mmol/L (ref 3.5–5.2)
Sodium: 141 mmol/L (ref 134–144)
Total Protein: 7.1 g/dL (ref 6.0–8.5)
eGFR: 99 mL/min/1.73 (ref >59)

## 2024-07-12 NOTE — Progress Notes (Signed)
 With LDL still not at goal in spite of being on 40 mg of Crestor , patient has already been referred for clinical pharmacy for initiation of PCSK9 inhibitor in view of elevated Lp(a).  This would be most appropriate.  Otherwise labs are within normal limits and hemoglobin has improved.

## 2024-07-14 ENCOUNTER — Encounter (HOSPITAL_COMMUNITY)
Admission: RE | Admit: 2024-07-14 | Discharge: 2024-07-14 | Disposition: A | Discharge status: Home / Self Care | Source: Ambulatory Visit | Attending: Cardiology | Admitting: Cardiology

## 2024-07-14 DIAGNOSIS — Z951 Presence of aortocoronary bypass graft: Secondary | ICD-10-CM

## 2024-07-14 DIAGNOSIS — R739 Hyperglycemia, unspecified: Secondary | ICD-10-CM

## 2024-07-14 DIAGNOSIS — Z48812 Encounter for surgical aftercare following surgery on the circulatory system: Secondary | ICD-10-CM | POA: Diagnosis not present

## 2024-07-16 ENCOUNTER — Encounter (HOSPITAL_COMMUNITY)
Admission: RE | Admit: 2024-07-16 | Discharge: 2024-07-16 | Disposition: A | Discharge status: Home / Self Care | Source: Ambulatory Visit | Attending: Cardiology | Admitting: Cardiology

## 2024-07-16 DIAGNOSIS — Z48812 Encounter for surgical aftercare following surgery on the circulatory system: Secondary | ICD-10-CM | POA: Diagnosis not present

## 2024-07-16 DIAGNOSIS — Z951 Presence of aortocoronary bypass graft: Secondary | ICD-10-CM

## 2024-07-18 ENCOUNTER — Encounter (HOSPITAL_COMMUNITY)

## 2024-07-18 ENCOUNTER — Encounter (HOSPITAL_COMMUNITY)
Admission: RE | Admit: 2024-07-18 | Discharge: 2024-07-18 | Disposition: A | Discharge status: Home / Self Care | Source: Ambulatory Visit | Attending: Cardiology | Admitting: Cardiology

## 2024-07-18 DIAGNOSIS — R739 Hyperglycemia, unspecified: Secondary | ICD-10-CM

## 2024-07-18 DIAGNOSIS — Z48812 Encounter for surgical aftercare following surgery on the circulatory system: Secondary | ICD-10-CM | POA: Diagnosis not present

## 2024-07-18 DIAGNOSIS — Z951 Presence of aortocoronary bypass graft: Secondary | ICD-10-CM

## 2024-07-23 ENCOUNTER — Encounter (HOSPITAL_COMMUNITY)
Admission: RE | Admit: 2024-07-23 | Discharge: 2024-07-23 | Disposition: A | Discharge status: Home / Self Care | Payer: Self-pay | Source: Ambulatory Visit | Attending: Cardiology | Admitting: Cardiology

## 2024-07-23 DIAGNOSIS — R739 Hyperglycemia, unspecified: Secondary | ICD-10-CM | POA: Insufficient documentation

## 2024-07-23 DIAGNOSIS — Z951 Presence of aortocoronary bypass graft: Secondary | ICD-10-CM | POA: Diagnosis not present

## 2024-07-25 ENCOUNTER — Encounter (HOSPITAL_COMMUNITY)
Admission: RE | Admit: 2024-07-25 | Discharge: 2024-07-25 | Disposition: A | Discharge status: Home / Self Care | Source: Ambulatory Visit | Attending: Cardiology | Admitting: Cardiology

## 2024-07-25 ENCOUNTER — Encounter (HOSPITAL_COMMUNITY)

## 2024-07-25 DIAGNOSIS — Z951 Presence of aortocoronary bypass graft: Secondary | ICD-10-CM

## 2024-07-25 DIAGNOSIS — R739 Hyperglycemia, unspecified: Secondary | ICD-10-CM | POA: Diagnosis not present

## 2024-07-28 ENCOUNTER — Encounter (HOSPITAL_COMMUNITY)
Admission: RE | Admit: 2024-07-28 | Discharge: 2024-07-28 | Disposition: A | Discharge status: Home / Self Care | Source: Ambulatory Visit | Attending: Cardiology | Admitting: Cardiology

## 2024-07-28 DIAGNOSIS — Z951 Presence of aortocoronary bypass graft: Secondary | ICD-10-CM

## 2024-07-28 DIAGNOSIS — R739 Hyperglycemia, unspecified: Secondary | ICD-10-CM

## 2024-07-29 NOTE — Progress Notes (Signed)
 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/2024  Hyperglycemia  Patient's Home Medications on Admission:  Current Outpatient Medications:    acetaminophen  (TYLENOL ) 500 MG tablet, Take 1-2 tablets (500-1,000 mg total) by mouth every 6 (six) hours as needed., Disp: , Rfl:    calcium  carbonate (OS-CAL) 600 MG TABS, Take 600 mg by mouth daily., Disp: , Rfl:    Cholecalciferol (VITAMIN D) 2000 UNITS tablet, Take 2,000 Units by mouth daily., Disp: , Rfl:    clopidogrel  (PLAVIX ) 75 MG tablet, Take 1 tablet (75 mg total) by mouth daily., Disp: 90 tablet, Rfl: 3   ferrous sulfate  325 (65 FE) MG tablet, Take 1 tablet (325 mg total) by mouth daily with breakfast., Disp: 30 tablet, Rfl: 0   fluticasone -salmeterol (WIXELA INHUB) 250-50 MCG/ACT AEPB, Inhale 1 puff into the lungs in the morning and at bedtime., Disp: 1 each, Rfl: 5   furosemide  (LASIX ) 40 MG tablet, TAKE 1 TABLET BY MOUTH EVERY DAY AS NEEDED FOR FLUID OR EDEMA,SWELLING OR WEIGHT GAIN OR SHORTNESS OF BREATH, Disp: 90 tablet, Rfl: 1   gabapentin  (NEURONTIN ) 300 MG/6ML solution, 2 mL Orally twice a day for 30 days, Disp: , Rfl:    levothyroxine  (SYNTHROID ) 100 MCG tablet, Take 100 mcg by mouth daily before breakfast., Disp: , Rfl:    losartan  (COZAAR ) 25 MG tablet, Take 1/2 tablet (12.5 mg total) by mouth every evening., Disp: 45 tablet, Rfl: 3   methocarbamol  (ROBAXIN ) 500 MG tablet, Take 0.5 tablets (250 mg total) by mouth every 8  (eight) hours as needed for muscle spasms., Disp: 15 tablet, Rfl: 0   Multiple Vitamins-Minerals (MULTIVITAMIN WITH MINERALS) tablet, Take 1 tablet by mouth daily., Disp: , Rfl:    nitroGLYCERIN  (NITROSTAT ) 0.4 MG SL tablet, Place 1 tablet (0.4 mg total) under the tongue every 5 (five) minutes as needed., Disp: 25 tablet, Rfl: 1   potassium chloride  SA (KLOR-CON  M) 20 MEQ tablet, Take 1 tablet (20 mEq total) by mouth daily. Take 1 tablet daily for 7 days then take 1 tablet every time you take Lasix , Disp: 30 tablet, Rfl: 0   rosuvastatin  (CRESTOR ) 40 MG tablet, Take 40 mg by mouth daily., Disp: , Rfl:    sertraline (ZOLOFT) 25 MG tablet, 1 tablet Orally Once a day for 30 day(s), Disp: , Rfl:    traMADol  (ULTRAM ) 50 MG tablet, Take 1 tablet (50 mg total) by mouth every 4 (four) hours as needed for moderate pain (pain score 4-6). (Patient not taking: Reported on 07/11/2024), Disp: 30 tablet, Rfl: 0  Past Medical History: Past Medical History:  Diagnosis Date   Arthritis    Back pain     CHRONIC LOW BACK PAIN - GETS INJECTIONS IN BACK   Constipation    Coronary artery disease    Eczema    Elevated liver enzymes    Gallstones    GERD (gastroesophageal reflux disease)    Hyperlipidemia    Hypertension    Hypothyroidism    Lactose intolerance  Shortness of breath    Thyroid  disease     Tobacco Use: Social History   Tobacco Use  Smoking Status Never  Smokeless Tobacco Never    Labs: Review Flowsheet       Latest Ref Rng & Units 03/14/2024 03/15/2024 03/17/2024 07/11/2024  Labs for ITP Cardiac and Pulmonary Rehab  Cholestrol 100 - 199 mg/dL 854  - - 844   LDL (calc) 0 - 99 mg/dL 67  - - 70   HDL-C >60 mg/dL 68  - - 71   Trlycerides 0 - 149 mg/dL 48  - - 73   Hemoglobin A1c 4.8 - 5.6 % 6.0  - - -  PH, Arterial 7.35 - 7.45 - 7.47  7.356  7.390  7.328  7.463  7.461  7.449  7.459  -  PCO2 arterial 32 - 48 mmHg - 39  45.2  40.0  42.2  32.3  36.0  34.1  35.2  -  Bicarbonate 20.0 -  28.0 mmol/L - 28.4  25.2  24.2  22.4  23.1  25.7  24.6  23.6  24.9  -  TCO2 22 - 32 mmol/L - - 26  25  24  24  24  27  28  28  26  25  26  26  26   -  Acid-base deficit 0.0 - 2.0 mmol/L - - 1.0  3.0  1.0  -  O2 Saturation % - 98.2  99  99  99  100  100  78  100  100  -    Details       Multiple values from one day are sorted in reverse-chronological order          Exercise Target Goals: Exercise Program Goal: Individual exercise prescription set using results from initial 6 min walk test and THRR while considering  patient's activity barriers and safety.   Exercise Prescription Goal: Initial exercise prescription builds to 30-45 minutes a day of aerobic activity, 2-3 days per week.  Home exercise guidelines will be given to patient during program as part of exercise prescription that the participant will acknowledge.   Education: Aerobic Exercise: - Group verbal and visual presentation on the components of exercise prescription. Introduces F.I.T.T principle from ACSM for exercise prescriptions.  Reviews F.I.T.T. principles of aerobic exercise including progression. Written material provided at class time.   Education: Resistance Exercise: - Group verbal and visual presentation on the components of exercise prescription. Introduces F.I.T.T principle from ACSM for exercise prescriptions  Reviews F.I.T.T. principles of resistance exercise including progression. Written material provided at class time.    Education: Exercise & Equipment Safety: - Individual verbal instruction and demonstration of equipment use and safety with use of the equipment.   Education: Exercise Physiology & General Exercise Guidelines: - Group verbal and written instruction with models to review the exercise physiology of the cardiovascular system and associated critical values. Provides general exercise guidelines with specific guidelines to those with heart or lung disease. Written material provided at class  time.   Education: Flexibility, Balance, Mind/Body Relaxation: - Group verbal and visual presentation with interactive activity on the components of exercise prescription. Introduces F.I.T.T principle from ACSM for exercise prescriptions. Reviews F.I.T.T. principles of flexibility and balance exercise training including progression. Also discusses the mind body connection.  Reviews various relaxation techniques to help reduce and manage stress (i.e. Deep breathing, progressive muscle relaxation, and visualization). Balance handout provided to take home. Written material provided at class time.  Activity Barriers & Risk Stratification:  Activity Barriers & Cardiac Risk Stratification - 06/24/24 1215       Activity Barriers & Cardiac Risk Stratification   Activity Barriers Arthritis;Joint Problems;Deconditioning;Balance Concerns    Cardiac Risk Stratification High          6 Minute Walk:  6 Minute Walk     Row Name 06/24/24 1213         6 Minute Walk   Phase Initial     Distance 828 feet     Walk Time 6 minutes     # of Rest Breaks 0     MPH 1.7     METS 1.81     RPE 9     Perceived Dyspnea  0     VO2 Peak 6.34     Symptoms No     Resting HR 53 bpm     Resting BP 98/70     Resting Oxygen Saturation  98 %     Exercise Oxygen Saturation  during 6 min walk 98 %     Max Ex. HR 107 bpm     Max Ex. BP 150/72     2 Minute Post BP 120/70        Oxygen Initial Assessment:   Oxygen Re-Evaluation:   Oxygen Discharge (Final Oxygen Re-Evaluation):   Initial Exercise Prescription:  Initial Exercise Prescription - 06/24/24 1200       Date of Initial Exercise RX and Referring Provider   Date 06/24/24    Referring Provider Gordy Bergamo, MD    Expected Discharge Date 09/17/24      NuStep   Level 1    SPM 70    Minutes 25    METs 1.8      Prescription Details   Frequency (times per week) 3    Duration Progress to 30 minutes of continuous aerobic without  signs/symptoms of physical distress      Intensity   THRR 40-80% of Max Heartrate 61-122    Ratings of Perceived Exertion 11-13    Perceived Dyspnea 0-4      Progression   Progression Continue progressive overload as per policy without signs/symptoms or physical distress.      Resistance Training   Training Prescription Yes    Weight 2 lbs    Reps 10-15          Perform Capillary Blood Glucose checks as needed.  Exercise Prescription Changes:   Exercise Prescription Changes     Row Name 06/30/24 1400 07/23/24 1500           Response to Exercise   Blood Pressure (Admit) 122/92 128/68      Blood Pressure (Exercise) 124/84 132/72      Blood Pressure (Exit) 114/66 116/62      Heart Rate (Admit) 67 bpm 66 bpm      Heart Rate (Exercise) 80 bpm 96 bpm      Heart Rate (Exit) 51 bpm 75 bpm      Rating of Perceived Exertion (Exercise) 11 11      Symptoms None None      Comments Pt's firsy day in the CRP2 program Reviewed METs      Duration Progress to 30 minutes of  aerobic without signs/symptoms of physical distress Continue with 30 min of aerobic exercise without signs/symptoms of physical distress.      Intensity THRR unchanged THRR unchanged        Progression   Progression Continue to  progress workloads to maintain intensity without signs/symptoms of physical distress. Continue to progress workloads to maintain intensity without signs/symptoms of physical distress.      Average METs 2.2 2.4        Resistance Training   Training Prescription Yes No      Weight 2 lbs No wts on wednesdays      Reps 10-15 --      Time 5 Minutes --        Interval Training   Interval Training No No        NuStep   Level 1 1      SPM 77 87      Minutes 20 30      METs 2.2 2.4         Exercise Comments:   Exercise Comments     Row Name 06/30/24 1426 07/23/24 1500         Exercise Comments Pt's first day in the CRP2 program. Pt exercised without complaints. Reviewed METs. Pt  is making progress. She has increased duration to 30 minutes nad increased her METs.         Exercise Goals and Review:   Exercise Goals     Row Name 06/24/24 1215             Exercise Goals   Increase Physical Activity Yes       Intervention Provide advice, education, support and counseling about physical activity/exercise needs.;Develop an individualized exercise prescription for aerobic and resistive training based on initial evaluation findings, risk stratification, comorbidities and participant's personal goals.       Expected Outcomes Short Term: Attend rehab on a regular basis to increase amount of physical activity.;Long Term: Add in home exercise to make exercise part of routine and to increase amount of physical activity.;Long Term: Exercising regularly at least 3-5 days a week.       Increase Strength and Stamina Yes       Intervention Provide advice, education, support and counseling about physical activity/exercise needs.;Develop an individualized exercise prescription for aerobic and resistive training based on initial evaluation findings, risk stratification, comorbidities and participant's personal goals.       Expected Outcomes Short Term: Increase workloads from initial exercise prescription for resistance, speed, and METs.;Short Term: Perform resistance training exercises routinely during rehab and add in resistance training at home;Long Term: Improve cardiorespiratory fitness, muscular endurance and strength as measured by increased METs and functional capacity ( )       Able to understand and use rate of perceived exertion (RPE) scale Yes       Intervention Provide education and explanation on how to use RPE scale       Expected Outcomes Short Term: Able to use RPE daily in rehab to express subjective intensity level;Long Term:  Able to use RPE to guide intensity level when exercising independently       Knowledge and understanding of Target Heart Rate Range (THRR) Yes        Intervention Provide education and explanation of THRR including how the numbers were predicted and where they are located for reference       Expected Outcomes Short Term: Able to state/look up THRR;Long Term: Able to use THRR to govern intensity when exercising independently;Short Term: Able to use daily as guideline for intensity in rehab       Understanding of Exercise Prescription Yes       Intervention Provide education, explanation, and written materials on patient's individual  exercise prescription       Expected Outcomes Short Term: Able to explain program exercise prescription;Long Term: Able to explain home exercise prescription to exercise independently          Exercise Goals Re-Evaluation :  Exercise Goals Re-Evaluation     Row Name 06/30/24 1424 07/25/24 1515           Exercise Goal Re-Evaluation   Exercise Goals Review Increase Physical Activity;Understanding of Exercise Prescription;Increase Strength and Stamina;Knowledge and understanding of Target Heart Rate Range (THRR);Able to understand and use rate of perceived exertion (RPE) scale Increase Physical Activity;Understanding of Exercise Prescription;Increase Strength and Stamina;Knowledge and understanding of Target Heart Rate Range (THRR);Able to understand and use rate of perceived exertion (RPE) scale      Comments Pt's first day in the CRP2 program. Pt understnads the RPE scale, THRR, and exercise Rx. Reviewed goals. Pt voices porgress on her goal of increased strength. Pt has goal of weight loss, but has not achieved any significant weight loss. Pt voices she is eating more vegtables and no red meat. Will continue to monitor and encourage pt on her weight loss.      Expected Outcomes Will continue to monitor patient and progress exercise workloads as tolerated. Will continue to monitor patient and progress exercise workloads as tolerated.         Discharge Exercise Prescription (Final Exercise Prescription  Changes):  Exercise Prescription Changes - 07/23/24 1500       Response to Exercise   Blood Pressure (Admit) 128/68    Blood Pressure (Exercise) 132/72    Blood Pressure (Exit) 116/62    Heart Rate (Admit) 66 bpm    Heart Rate (Exercise) 96 bpm    Heart Rate (Exit) 75 bpm    Rating of Perceived Exertion (Exercise) 11    Symptoms None    Comments Reviewed METs    Duration Continue with 30 min of aerobic exercise without signs/symptoms of physical distress.    Intensity THRR unchanged      Progression   Progression Continue to progress workloads to maintain intensity without signs/symptoms of physical distress.    Average METs 2.4      Resistance Training   Training Prescription No    Weight No wts on wednesdays      Interval Training   Interval Training No      NuStep   Level 1    SPM 87    Minutes 30    METs 2.4          Nutrition:  Target Goals: Understanding of nutrition guidelines, daily intake of sodium 1500mg , cholesterol 200mg , calories 30% from fat and 7% or less from saturated fats, daily to have 5 or more servings of fruits and vegetables.  Education: Nutrition 1 -Group instruction provided by verbal, written material, interactive activities, discussions, models, and posters to present general guidelines for heart healthy nutrition including macronutrients, label reading, and promoting whole foods over processed counterparts. Education serves as Pensions consultant of discussion of heart healthy eating for all. Written material provided at class time.    Education: Nutrition 2 -Group instruction provided by verbal, written material, interactive activities, discussions, models, and posters to present general guidelines for heart healthy nutrition including sodium, cholesterol, and saturated fat. Providing guidance of habit forming to improve blood pressure, cholesterol, and body weight. Written material provided at class time.     Biometrics:  Pre Biometrics -  06/24/24 0945       Pre Biometrics  Waist Circumference 43.75 inches    Hip Circumference 47.5 inches    Waist to Hip Ratio 0.92 %    Triceps Skinfold 32 mm    % Body Fat 49.4 %    Grip Strength 13 kg    Flexibility --   No performed due to h/o low back surgery   Single Leg Stand 2.68 seconds           Nutrition Therapy Plan and Nutrition Goals:  Nutrition Therapy & Goals - 06/30/24 1339       Nutrition Therapy   Diet Heart Healthy Diet    Drug/Food Interactions Statins/Certain Fruits      Personal Nutrition Goals   Nutrition Goal Patient to identify strategies for reducing cardiovascular risk by attending the [employer] education and nutrition series weekly.    Personal Goal #2 Patient to improve diet quality by using the plate method as a guide for meal planning to include lean protein/plant protein, fruits, vegetables, whole grains, nonfat dairy as part of a well-balanced diet.    Comments Marquerite has medical history of AD s/p CABG x 3, hypertension, hyperlipidemia, hypothyroidism, prediabetes, arthritis, and GERD . LDL is at goal. A1c is in a prediabetic range. Patient will benefit from participation in intensive cardiac rehab for nutrition education, exercise, and lifestyle modification.      Intervention Plan   Intervention Prescribe, educate and counsel regarding individualized specific dietary modifications aiming towards targeted core components such as weight, hypertension, lipid management, diabetes, heart failure and other comorbidities.;Nutrition handout(s) given to patient.    Expected Outcomes Short Term Goal: Understand basic principles of dietary content, such as calories, fat, sodium, cholesterol and nutrients.;Long Term Goal: Adherence to prescribed nutrition plan.          Nutrition Assessments:  Nutrition Assessments - 06/30/24 1350       Rate Your Plate Scores   Pre Score 70         MEDIFICTS Score Key: >=70 Need to make dietary changes  40-70  Heart Healthy Diet <= 40 Therapeutic Level Cholesterol Diet  Flowsheet Row INTENSIVE CARDIAC REHAB from 06/30/2024 in [facility] for Heart, Vascular, & Lung Health  Picture Your Plate Total Score on Admission 70   Picture Your Plate Scores: <59 Unhealthy dietary pattern with much room for improvement. 41-50 Dietary pattern unlikely to meet recommendations for good health and room for improvement. 51-60 More healthful dietary pattern, with some room for improvement.  >60 Healthy dietary pattern, although there may be some specific behaviors that could be improved.    Nutrition Goals Re-Evaluation:  Nutrition Goals Re-Evaluation     Row Name 06/30/24 1339             Goals   Current Weight 169 lb 1.5 oz (76.7 kg)       Comment LPa 201.6, A1c 6.0, LDL 67       Expected Outcome Mercia has medical history of AD s/p CABG x 3, hypertension, hyperlipidemia, hypothyroidism, prediabetes, arthritis, and GERD . LDL is at goal. A1c is in a prediabetic range. Patient will benefit from participation in intensive cardiac rehab for nutrition education, exercise, and lifestyle modification.          Nutrition Goals Discharge (Final Nutrition Goals Re-Evaluation):  Nutrition Goals Re-Evaluation - 06/30/24 1339       Goals   Current Weight 169 lb 1.5 oz (76.7 kg)    Comment LPa 201.6, A1c 6.0, LDL 67    Expected Outcome  Antigone has medical history of AD s/p CABG x 3, hypertension, hyperlipidemia, hypothyroidism, prediabetes, arthritis, and GERD . LDL is at goal. A1c is in a prediabetic range. Patient will benefit from participation in intensive cardiac rehab for nutrition education, exercise, and lifestyle modification.          Psychosocial: Target Goals: Acknowledge presence or absence of significant depression and/or stress, maximize coping skills, provide positive support system. Participant is able to verbalize types and ability to use techniques and skills needed  for reducing stress and depression.   Education: Stress, Anxiety, and Depression - Group verbal and visual presentation to define topics covered.  Reviews how body is impacted by stress, anxiety, and depression.  Also discusses healthy ways to reduce stress and to treat/manage anxiety and depression. Written material provided at class time.   Education: Sleep Hygiene -Provides group verbal and written instruction about how sleep can affect your health.  Define sleep hygiene, discuss sleep cycles and impact of sleep habits. Review good sleep hygiene tips.   Initial Review & Psychosocial Screening:  Initial Psych Review & Screening - 06/24/24 1312       Initial Review   Current issues with None Identified      Family Dynamics   Good Support System? Yes   Barabara has her husband, daughter and friends who live in the area fo support. Senaida's son lives out of state. Kileen's sister lives in [place].     Barriers   Psychosocial barriers to participate in program There are no identifiable barriers or psychosocial needs.      Screening Interventions   Interventions Encouraged to exercise;Provide feedback about the scores to participant    Expected Outcomes Long Term Goal: Stressors or current issues are controlled or eliminated.          Quality of Life Scores:   Quality of Life - 06/24/24 1217       Quality of Life   Select Quality of Life      Quality of Life Scores   Health/Function Pre 26.14 %    Socioeconomic Pre 30 %    Psych/Spiritual Pre 30 %    Family Pre 30 %    GLOBAL Pre 28.31 %         Scores of 19 and below usually indicate a poorer quality of life in these areas.  A difference of  2-3 points is a clinically meaningful difference.  A difference of 2-3 points in the total score of the Quality of Life Index has been associated with significant improvement in overall quality of life, self-image, physical symptoms, and general health in studies assessing change in  quality of life.  PHQ-9: Review Flowsheet       06/24/2024  Depression screen PHQ 2/9  Decreased Interest 0  Down, Depressed, Hopeless 0  PHQ - 2 Score 0  Altered sleeping 0  Tired, decreased energy 1  Change in appetite 0  Feeling bad or failure about yourself  0  Trouble concentrating 0  Moving slowly or fidgety/restless 1  Suicidal thoughts 0  PHQ-9 Score 2  Difficult doing work/chores Somewhat difficult   Interpretation of Total Score  Total Score Depression Severity:  1-4 = Minimal depression, 5-9 = Mild depression, 10-14 = Moderate depression, 15-19 = Moderately severe depression, 20-27 = Severe depression   Psychosocial Evaluation and Intervention:   Psychosocial Re-Evaluation:  Psychosocial Re-Evaluation     Row Name 06/30/24 1433 07/28/24 0840  Psychosocial Re-Evaluation   Current issues with Current Stress Concerns Current Stress Concerns      Comments Reviewed PHQ9. Lynette reports feeling tired and has had little energy post surgery. Chioma says she has been sleeping in her recliner but has recently started sleeping in the bed at least one day a week No new psychosocial concerns voiced by pt during exercise at cardiac rehab      Expected Outcomes Aedyn will have controlled or decreased stressors upon completion of cardiac rehab. Anamika will have controlled or decreased stressors upon completion of cardiac rehab.      Interventions Stress management education;Relaxation education;Encouraged to attend Cardiac Rehabilitation for the exercise Stress management education;Relaxation education;Encouraged to attend Cardiac Rehabilitation for the exercise      Continue Psychosocial Services  Follow up required by staff Follow up required by staff        Initial Review   Source of Stress Concerns Chronic Illness Chronic Illness      Comments Will continue to monitor and offer support as needed. Will continue to monitor and offer support as needed.          Psychosocial Discharge (Final Psychosocial Re-Evaluation):  Psychosocial Re-Evaluation - 07/28/24 0840       Psychosocial Re-Evaluation   Current issues with Current Stress Concerns    Comments No new psychosocial concerns voiced by pt during exercise at cardiac rehab    Expected Outcomes Debralee will have controlled or decreased stressors upon completion of cardiac rehab.    Interventions Stress management education;Relaxation education;Encouraged to attend Cardiac Rehabilitation for the exercise    Continue Psychosocial Services  Follow up required by staff      Initial Review   Source of Stress Concerns Chronic Illness    Comments Will continue to monitor and offer support as needed.          Vocational Rehabilitation: Provide vocational rehab assistance to qualifying candidates.   Vocational Rehab Evaluation & Intervention:  Vocational Rehab - 06/24/24 1220       Initial Vocational Rehab Evaluation & Intervention   Assessment shows need for Vocational Rehabilitation No      Vocational Rehab Re-Evaulation   Comments Pt is retired          Education: Education Goals: Education classes will be provided on a variety of topics geared toward better understanding of heart health and risk factor modification. Participant will state understanding/return demonstration of topics presented as noted by education test scores.  Learning Barriers/Preferences:  Learning Barriers/Preferences - 06/24/24 1221       Learning Barriers/Preferences   Learning Barriers Sight    Learning Preferences Written Material;Computer/Internet          General Cardiac Education Topics:  AED/CPR: - Group verbal and written instruction with the use of models to demonstrate the basic use of the AED with the basic ABC's of resuscitation.   Test and Procedures: - Group verbal and visual presentation and models provide information about basic cardiac anatomy and function. Reviews the testing  methods done to diagnose heart disease and the outcomes of the test results. Describes the treatment choices: Medical Management, Angioplasty, or Coronary Bypass Surgery for treating various heart conditions including Myocardial Infarction, Angina, Valve Disease, and Cardiac Arrhythmias. Written material provided at class time.   Medication Safety: - Group verbal and visual instruction to review commonly prescribed medications for heart and lung disease. Reviews the medication, class of the drug, and side effects. Includes the steps to properly store meds  and maintain the prescription regimen. Written material provided at class time.   Intimacy: - Group verbal instruction through game format to discuss how heart and lung disease can affect sexual intimacy. Written material provided at class time.   Know Your Numbers and Heart Failure: - Group verbal and visual instruction to discuss disease risk factors for cardiac and pulmonary disease and treatment options.  Reviews associated critical values for Overweight/Obesity, Hypertension, Cholesterol, and Diabetes.  Discusses basics of heart failure: signs/symptoms and treatments.  Introduces Heart Failure Zone chart for action plan for heart failure. Written material provided at class time.   Infection Prevention: - Provides verbal and written material to individual with discussion of infection control including proper hand washing and proper equipment cleaning during exercise session.   Falls Prevention: - Provides verbal and written material to individual with discussion of falls prevention and safety.   Other: -Provides group and verbal instruction on various topics (see comments)   Knowledge Questionnaire Score:  Knowledge Questionnaire Score - 06/24/24 1220       Knowledge Questionnaire Score   Pre Score 20/24          Core Components/Risk Factors/Patient Goals at Admission:  Personal Goals and Risk Factors at Admission -  06/24/24 1220       Core Components/Risk Factors/Patient Goals on Admission    Weight Management Weight Loss    Hypertension Yes    Intervention Provide education on lifestyle modifcations including regular physical activity/exercise, weight management, moderate sodium restriction and increased consumption of fresh fruit, vegetables, and low fat dairy, alcohol moderation, and smoking cessation.;Monitor prescription use compliance.    Expected Outcomes Short Term: Continued assessment and intervention until BP is < 140/39mm HG in hypertensive participants. < 130/80mm HG in hypertensive participants with diabetes, heart failure or chronic kidney disease.;Long Term: Maintenance of blood pressure at goal levels.    Lipids Yes    Intervention Provide education and support for participant on nutrition & aerobic/resistive exercise along with prescribed medications to achieve LDL 70mg , HDL >40mg .    Expected Outcomes Short Term: Participant states understanding of desired cholesterol values and is compliant with medications prescribed. Participant is following exercise prescription and nutrition guidelines.;Long Term: Cholesterol controlled with medications as prescribed, with individualized exercise RX and with personalized nutrition plan. Value goals: LDL < 70mg , HDL > 40 mg.          Education:Diabetes - Individual verbal and written instruction to review signs/symptoms of diabetes, desired ranges of glucose level fasting, after meals and with exercise. Acknowledge that pre and post exercise glucose checks will be done for 3 sessions at entry of program.   Core Components/Risk Factors/Patient Goals Review:   Goals and Risk Factor Review     Row Name 06/30/24 1437 07/28/24 0843           Core Components/Risk Factors/Patient Goals Review   Personal Goals Review Weight Management/Obesity;Hypertension;Lipids Weight Management/Obesity;Hypertension;Lipids      Review Teighan started cardiac rehab  on 06/30/24. Viola did well with exercise. Vital signs were stable. Christna is somewhat deconditioned. Idora is doing well with exercise at cardiac rehab. Vital signs have been stable. Endia has increased his met levels.      Expected Outcomes Tiffine will continue to participate in cardiac rehab for exercise, nutrition and lifestyle modificaitons Delvina will continue to participate in cardiac rehab for exercise, nutrition and lifestyle modificaitons         Core Components/Risk Factors/Patient Goals at Discharge (Final Review):   Goals  and Risk Factor Review - 07/28/24 0843       Core Components/Risk Factors/Patient Goals Review   Personal Goals Review Weight Management/Obesity;Hypertension;Lipids    Review Yoshika is doing well with exercise at cardiac rehab. Vital signs have been stable. Kalista has increased his met levels.    Expected Outcomes Janay will continue to participate in cardiac rehab for exercise, nutrition and lifestyle modificaitons          ITP Comments:  ITP Comments     Row Name 06/24/24 0929 06/30/24 1432 07/28/24 0840       ITP Comments Wilbert Bihari, MD: Medical Director.  Introduction to the [facility] education program/Intensive cardiac rehab.  Initial orientation packet reviewed with the patient. 30 Day ITP Review. Philomina started cardiac rehab on 06/30/24 Cameo did well with exercise for her fitness level 30 Day ITP Review. Kaiden has good attendance and participation with exercise at cardiac rehab        Comments: see ITP comments

## 2024-07-30 ENCOUNTER — Encounter (HOSPITAL_COMMUNITY)
Admission: RE | Admit: 2024-07-30 | Discharge: 2024-07-30 | Disposition: A | Discharge status: Home / Self Care | Source: Ambulatory Visit | Attending: Cardiology | Admitting: Cardiology

## 2024-07-30 DIAGNOSIS — R739 Hyperglycemia, unspecified: Secondary | ICD-10-CM | POA: Diagnosis not present

## 2024-07-30 DIAGNOSIS — Z951 Presence of aortocoronary bypass graft: Secondary | ICD-10-CM

## 2024-07-30 NOTE — Progress Notes (Signed)
 Reviewed home exercise Rx with patient today.  Encouraged warm-up, cool-down, and stretching. Reviewed THRR of  61-122 and keeping RPE between 11-13. Encouraged to hydrate with activity.  ?Reviewed weather parameters for temperature and humidity for safe exercise outdoors. Reviewed S/S to terminate exercise and when to call 911 vs MD. Reviewed the use of NTG and pt was encouraged to carry at all times. Pt encouraged to always carry a cell phone for safety when exercising outdoors. Pt verbalized understanding of the home exercise Rx and was provided a copy.  ? ?Lorin Picket MS, ACSM-CEP, CCRP  ?

## 2024-08-01 ENCOUNTER — Encounter (HOSPITAL_COMMUNITY)
Admission: RE | Admit: 2024-08-01 | Discharge: 2024-08-01 | Disposition: A | Discharge status: Home / Self Care | Source: Ambulatory Visit | Attending: Cardiology | Admitting: Cardiology

## 2024-08-01 ENCOUNTER — Encounter (HOSPITAL_COMMUNITY)

## 2024-08-01 DIAGNOSIS — R739 Hyperglycemia, unspecified: Secondary | ICD-10-CM | POA: Diagnosis not present

## 2024-08-01 DIAGNOSIS — Z951 Presence of aortocoronary bypass graft: Secondary | ICD-10-CM

## 2024-08-04 ENCOUNTER — Encounter (HOSPITAL_COMMUNITY)
Admission: RE | Admit: 2024-08-04 | Discharge: 2024-08-04 | Disposition: A | Discharge status: Home / Self Care | Payer: Self-pay | Source: Ambulatory Visit | Attending: Cardiology

## 2024-08-04 DIAGNOSIS — R739 Hyperglycemia, unspecified: Secondary | ICD-10-CM | POA: Diagnosis not present

## 2024-08-04 DIAGNOSIS — Z951 Presence of aortocoronary bypass graft: Secondary | ICD-10-CM

## 2024-08-06 ENCOUNTER — Telehealth (HOSPITAL_COMMUNITY): Payer: Self-pay

## 2024-08-06 ENCOUNTER — Encounter (HOSPITAL_COMMUNITY): Admission: RE | Admit: 2024-08-06 | Payer: Self-pay | Source: Ambulatory Visit

## 2024-08-06 NOTE — Telephone Encounter (Signed)
 Patient c/o for 11:45 CR class, states she has felt 'a twist in her knees' that is affecting her mobility and is afraid of falling.

## 2024-08-08 ENCOUNTER — Encounter (HOSPITAL_COMMUNITY)
Admission: RE | Admit: 2024-08-08 | Discharge: 2024-08-08 | Disposition: A | Discharge status: Home / Self Care | Source: Ambulatory Visit | Attending: Cardiology | Admitting: Cardiology

## 2024-08-08 ENCOUNTER — Encounter (HOSPITAL_COMMUNITY)

## 2024-08-08 DIAGNOSIS — R739 Hyperglycemia, unspecified: Secondary | ICD-10-CM

## 2024-08-08 DIAGNOSIS — Z951 Presence of aortocoronary bypass graft: Secondary | ICD-10-CM

## 2024-08-11 ENCOUNTER — Encounter (HOSPITAL_COMMUNITY)
Admission: RE | Admit: 2024-08-11 | Discharge: 2024-08-11 | Disposition: A | Discharge status: Home / Self Care | Payer: Self-pay | Source: Ambulatory Visit | Attending: Cardiology

## 2024-08-11 DIAGNOSIS — R739 Hyperglycemia, unspecified: Secondary | ICD-10-CM | POA: Diagnosis not present

## 2024-08-11 DIAGNOSIS — Z951 Presence of aortocoronary bypass graft: Secondary | ICD-10-CM

## 2024-08-13 ENCOUNTER — Encounter (HOSPITAL_COMMUNITY)
Admission: RE | Admit: 2024-08-13 | Discharge: 2024-08-13 | Disposition: A | Discharge status: Home / Self Care | Payer: Self-pay | Source: Ambulatory Visit | Attending: Cardiology | Admitting: Cardiology

## 2024-08-13 DIAGNOSIS — R739 Hyperglycemia, unspecified: Secondary | ICD-10-CM

## 2024-08-13 DIAGNOSIS — Z951 Presence of aortocoronary bypass graft: Secondary | ICD-10-CM

## 2024-08-15 ENCOUNTER — Ambulatory Visit: Admitting: Pharmacist Clinician (PhC)/ Clinical Pharmacy Specialist

## 2024-08-15 ENCOUNTER — Encounter (HOSPITAL_COMMUNITY)

## 2024-08-18 ENCOUNTER — Encounter (HOSPITAL_COMMUNITY)
Admission: RE | Admit: 2024-08-18 | Discharge: 2024-08-18 | Disposition: A | Payer: Self-pay | Source: Ambulatory Visit | Attending: Cardiology

## 2024-08-18 DIAGNOSIS — R739 Hyperglycemia, unspecified: Secondary | ICD-10-CM | POA: Diagnosis not present

## 2024-08-18 DIAGNOSIS — Z951 Presence of aortocoronary bypass graft: Secondary | ICD-10-CM

## 2024-08-20 ENCOUNTER — Telehealth (HOSPITAL_COMMUNITY): Payer: Self-pay

## 2024-08-20 ENCOUNTER — Encounter (HOSPITAL_COMMUNITY): Admission: RE | Admit: 2024-08-20 | Source: Ambulatory Visit

## 2024-08-20 NOTE — Telephone Encounter (Signed)
 Patient c/o for 11:45 CR class, states she has extreme R knee pain and is barely mobile. She states she is not sure she can continue the cardiac rehab program at this point due to her mobility.

## 2024-08-21 ENCOUNTER — Telehealth (HOSPITAL_COMMUNITY): Payer: Self-pay

## 2024-08-21 NOTE — Telephone Encounter (Signed)
 Called pt to follow-up on her call out form class regarding her knee pain. Pt voices that she has had this before. Discussed making appt with MD to evaluate this issue. Pt will rest this weekend and call next week to inform us  if she plans to continue the CRP program.   Alm Parkins MS, ACSM-CEP, CCRP

## 2024-08-22 ENCOUNTER — Encounter (HOSPITAL_COMMUNITY): Payer: Self-pay

## 2024-08-25 ENCOUNTER — Encounter (HOSPITAL_COMMUNITY)
Admission: RE | Admit: 2024-08-25 | Discharge: 2024-08-25 | Disposition: A | Discharge status: Home / Self Care | Payer: Self-pay | Source: Ambulatory Visit | Attending: Cardiology | Admitting: Cardiology

## 2024-08-25 DIAGNOSIS — Z951 Presence of aortocoronary bypass graft: Secondary | ICD-10-CM | POA: Insufficient documentation

## 2024-08-25 DIAGNOSIS — R739 Hyperglycemia, unspecified: Secondary | ICD-10-CM | POA: Insufficient documentation

## 2024-08-26 ENCOUNTER — Encounter (HOSPITAL_COMMUNITY): Payer: Self-pay

## 2024-08-26 NOTE — Progress Notes (Signed)
 Cardiac Individual Treatment Plan  Patient Details  Name: Linda Cardenas MRN: [id number] Date of Birth: 04/17/1956 Referring Provider:   Flowsheet Row INTENSIVE CARDIAC REHAB ORIENT from 06/24/2024 in [facility]  Referring Provider Gordy Bergamo, MD    Initial Encounter Date:  Flowsheet Row INTENSIVE CARDIAC REHAB ORIENT from 06/24/2024 in [facility]  Date 06/24/24    Visit Diagnosis: No diagnosis found.  Patient's Home Medications on Admission:  Current Outpatient Medications:    acetaminophen  (TYLENOL ) 500 MG tablet, Take 1-2 tablets (500-1,000 mg total) by mouth every 6 (six) hours as needed., Disp: , Rfl:    calcium  carbonate (OS-CAL) 600 MG TABS, Take 600 mg by mouth daily., Disp: , Rfl:    Cholecalciferol (VITAMIN D) 2000 UNITS tablet, Take 2,000 Units by mouth daily., Disp: , Rfl:    clopidogrel  (PLAVIX ) 75 MG tablet, Take 1 tablet (75 mg total) by mouth daily., Disp: 90 tablet, Rfl: 3   ferrous sulfate  325 (65 FE) MG tablet, Take 1 tablet (325 mg total) by mouth daily with breakfast., Disp: 30 tablet, Rfl: 0   fluticasone -salmeterol (WIXELA INHUB) 250-50 MCG/ACT AEPB, Inhale 1 puff into the lungs in the morning and at bedtime., Disp: 1 each, Rfl: 5   furosemide  (LASIX ) 40 MG tablet, TAKE 1 TABLET BY MOUTH EVERY DAY AS NEEDED FOR FLUID OR EDEMA,SWELLING OR WEIGHT GAIN OR SHORTNESS OF BREATH, Disp: 90 tablet, Rfl: 1   gabapentin  (NEURONTIN ) 300 MG/6ML solution, 2 mL Orally twice a day for 30 days, Disp: , Rfl:    levothyroxine  (SYNTHROID ) 100 MCG tablet, Take 100 mcg by mouth daily before breakfast., Disp: , Rfl:    losartan  (COZAAR ) 25 MG tablet, Take 1/2 tablet (12.5 mg total) by mouth every evening., Disp: 45 tablet, Rfl: 3   methocarbamol  (ROBAXIN ) 500 MG tablet, Take 0.5 tablets (250 mg total) by mouth every 8 (eight) hours as needed for muscle spasms., Disp: 15  tablet, Rfl: 0   Multiple Vitamins-Minerals (MULTIVITAMIN WITH MINERALS) tablet, Take 1 tablet by mouth daily., Disp: , Rfl:    nitroGLYCERIN  (NITROSTAT ) 0.4 MG SL tablet, Place 1 tablet (0.4 mg total) under the tongue every 5 (five) minutes as needed., Disp: 25 tablet, Rfl: 1   potassium chloride  SA (KLOR-CON  M) 20 MEQ tablet, Take 1 tablet (20 mEq total) by mouth daily. Take 1 tablet daily for 7 days then take 1 tablet every time you take Lasix , Disp: 30 tablet, Rfl: 0   rosuvastatin  (CRESTOR ) 40 MG tablet, Take 40 mg by mouth daily., Disp: , Rfl:    sertraline (ZOLOFT) 25 MG tablet, 1 tablet Orally Once a day for 30 day(s), Disp: , Rfl:    traMADol  (ULTRAM ) 50 MG tablet, Take 1 tablet (50 mg total) by mouth every 4 (four) hours as needed for moderate pain (pain score 4-6). (Patient not taking: Reported on 07/11/2024), Disp: 30 tablet, Rfl: 0  Past Medical History: Past Medical History:  Diagnosis Date   Arthritis    Back pain     CHRONIC LOW BACK PAIN - GETS INJECTIONS IN BACK   Constipation    Coronary artery disease    Eczema    Elevated liver enzymes    Gallstones    GERD (gastroesophageal reflux disease)    Hyperlipidemia    Hypertension    Hypothyroidism    Lactose intolerance    Shortness of breath    Thyroid   disease     Tobacco Use: Social History   Tobacco Use  Smoking Status Never  Smokeless Tobacco Never    Labs: Review Flowsheet       Latest Ref Rng & Units 03/14/2024 03/15/2024 03/17/2024 07/11/2024  Labs for ITP Cardiac and Pulmonary Rehab  Cholestrol 100 - 199 mg/dL 854  - - 844   LDL (calc) 0 - 99 mg/dL 67  - - 70   HDL-C >60 mg/dL 68  - - 71   Trlycerides 0 - 149 mg/dL 48  - - 73   Hemoglobin A1c 4.8 - 5.6 % 6.0  - - -  PH, Arterial 7.35 - 7.45 - 7.47  7.356  7.390  7.328  7.463  7.461  7.449  7.459  -  PCO2 arterial 32 - 48 mmHg - 39  45.2  40.0  42.2  32.3  36.0  34.1  35.2  -  Bicarbonate 20.0 - 28.0 mmol/L - 28.4  25.2  24.2  22.4  23.1  25.7  24.6   23.6  24.9  -  TCO2 22 - 32 mmol/L - - 26  25  24  24  24  27  28  28  26  25  26  26  26   -  Acid-base deficit 0.0 - 2.0 mmol/L - - 1.0  3.0  1.0  -  O2 Saturation % - 98.2  99  99  99  100  100  78  100  100  -    Details       Multiple values from one day are sorted in reverse-chronological order          Exercise Target Goals: Exercise Program Goal: Individual exercise prescription set using results from initial 6 min walk test and THRR while considering  patient's activity barriers and safety.   Exercise Prescription Goal: Initial exercise prescription builds to 30-45 minutes a day of aerobic activity, 2-3 days per week.  Home exercise guidelines will be given to patient during program as part of exercise prescription that the participant will acknowledge.   Education: Aerobic Exercise: - Group verbal and visual presentation on the components of exercise prescription. Introduces F.I.T.T principle from ACSM for exercise prescriptions.  Reviews F.I.T.T. principles of aerobic exercise including progression. Written material provided at class time.   Education: Resistance Exercise: - Group verbal and visual presentation on the components of exercise prescription. Introduces F.I.T.T principle from ACSM for exercise prescriptions  Reviews F.I.T.T. principles of resistance exercise including progression. Written material provided at class time.    Education: Exercise & Equipment Safety: - Individual verbal instruction and demonstration of equipment use and safety with use of the equipment.   Education: Exercise Physiology & General Exercise Guidelines: - Group verbal and written instruction with models to review the exercise physiology of the cardiovascular system and associated critical values. Provides general exercise guidelines with specific guidelines to those with heart or lung disease. Written material provided at class time.   Education: Flexibility, Balance, Mind/Body  Relaxation: - Group verbal and visual presentation with interactive activity on the components of exercise prescription. Introduces F.I.T.T principle from ACSM for exercise prescriptions. Reviews F.I.T.T. principles of flexibility and balance exercise training including progression. Also discusses the mind body connection.  Reviews various relaxation techniques to help reduce and manage stress (i.e. Deep breathing, progressive muscle relaxation, and visualization). Balance handout provided to take home. Written material provided at class time.   Activity Barriers & Risk Stratification:  6 Minute Walk:   Oxygen Initial Assessment:   Oxygen Re-Evaluation:   Oxygen Discharge (Final Oxygen Re-Evaluation):   Initial Exercise Prescription:   Perform Capillary Blood Glucose checks as needed.  Exercise Prescription Changes:   Exercise Prescription Changes     Row Name 06/30/24 1400 07/23/24 1500 07/30/24 1500 08/25/24 1700       Response to Exercise   Blood Pressure (Admit) 122/92 128/68 126/69 112/64    Blood Pressure (Exercise) 124/84 132/72 152/72 --    Blood Pressure (Exit) 114/66 116/62 120/70 114/70    Heart Rate (Admit) 67 bpm 66 bpm 60 bpm 66 bpm    Heart Rate (Exercise) 80 bpm 96 bpm 95 bpm 91 bpm    Heart Rate (Exit) 51 bpm 75 bpm 65 bpm 71 bpm    Rating of Perceived Exertion (Exercise) 11 11 9 9     Symptoms None None None None    Comments Pt's firsy day in the CRP2 program Reviewed METs Reviewed Home exercise Rx Reviewed METS and goals    Duration Progress to 30 minutes of  aerobic without signs/symptoms of physical distress Continue with 30 min of aerobic exercise without signs/symptoms of physical distress. Continue with 30 min of aerobic exercise without signs/symptoms of physical distress. Continue with 30 min of aerobic exercise without signs/symptoms of physical distress.    Intensity THRR unchanged THRR unchanged THRR unchanged THRR unchanged      Progression    Progression Continue to progress workloads to maintain intensity without signs/symptoms of physical distress. Continue to progress workloads to maintain intensity without signs/symptoms of physical distress. Continue to progress workloads to maintain intensity without signs/symptoms of physical distress. Continue to progress workloads to maintain intensity without signs/symptoms of physical distress.    Average METs 2.2 2.4 2.4 2.6      Resistance Training   Training Prescription Yes No No Yes    Weight 2 lbs No wts on wednesdays No wts on wednesdays 2 lbs    Reps 10-15 -- -- 10-15    Time 5 Minutes -- -- 5 Minutes      Interval Training   Interval Training No No No No      NuStep   Level 1 1 1 1     SPM 77 87 89 94    Minutes 20 30 30 30     METs 2.2 2.4 2.4 2.6      Home Exercise Plan   Plans to continue exercise at -- -- Home (comment) Home (comment)    Frequency -- -- Add 3 additional days to program exercise sessions. Add 3 additional days to program exercise sessions.    Initial Home Exercises Provided -- -- 07/30/24 07/30/24       Exercise Comments:   Exercise Comments     Row Name 06/30/24 1426 07/23/24 1500 07/30/24 1543 08/20/24 0942 08/25/24 1703   Exercise Comments Pt's first day in the CRP2 program. Pt exercised without complaints. Reviewed METs. Pt is making progress. She has increased duration to 30 minutes nad increased her METs. Reviewed home exercise Rx with patient today. Pt voices that she is walking at home in her driveway 2-3x/week for 20 minutes. Pt encouraged to increase her time as able to 30 minute session. Pt verbalized understanding of the home exercise Rx and was provided a copy. Pt due for METs/goals review. Pt did not attend today. Will complete upon return. Reviewed METs and goals. Pt was out due to right knee pain but voices no pain  in her knee today. Pt was able to increase her METs today.      Exercise Goals and Review:   Exercise Goals  Re-Evaluation :  Exercise Goals Re-Evaluation     Row Name 06/30/24 1424 07/25/24 1515 08/25/24 1701         Exercise Goal Re-Evaluation   Exercise Goals Review Increase Physical Activity;Understanding of Exercise Prescription;Increase Strength and Stamina;Knowledge and understanding of Target Heart Rate Range (THRR);Able to understand and use rate of perceived exertion (RPE) scale Increase Physical Activity;Understanding of Exercise Prescription;Increase Strength and Stamina;Knowledge and understanding of Target Heart Rate Range (THRR);Able to understand and use rate of perceived exertion (RPE) scale Increase Physical Activity;Understanding of Exercise Prescription;Increase Strength and Stamina;Knowledge and understanding of Target Heart Rate Range (THRR);Able to understand and use rate of perceived exertion (RPE) scale     Comments Pt's first day in the CRP2 program. Pt understnads the RPE scale, THRR, and exercise Rx. Reviewed goals. Pt voices porgress on her goal of increased strength. Pt has goal of weight loss, but has not achieved any significant weight loss. Pt voices she is eating more vegtables and no red meat. Will continue to monitor and encourage pt on her weight loss. Reviewed goals and METs. Pt continue to voice some progress on her goal of increased strength. Pt has goal of weight loss and has lost 1.5 kg. Will continue to monitor and encourage pt on her weight loss.     Expected Outcomes Will continue to monitor patient and progress exercise workloads as tolerated. Will continue to monitor patient and progress exercise workloads as tolerated. Will continue to monitor patient and progress exercise workloads as tolerated.        Discharge Exercise Prescription (Final Exercise Prescription Changes):  Exercise Prescription Changes - 08/25/24 1700       Response to Exercise   Blood Pressure (Admit) 112/64    Blood Pressure (Exit) 114/70    Heart Rate (Admit) 66 bpm    Heart Rate  (Exercise) 91 bpm    Heart Rate (Exit) 71 bpm    Rating of Perceived Exertion (Exercise) 9    Symptoms None    Comments Reviewed METS and goals    Duration Continue with 30 min of aerobic exercise without signs/symptoms of physical distress.    Intensity THRR unchanged      Progression   Progression Continue to progress workloads to maintain intensity without signs/symptoms of physical distress.    Average METs 2.6      Resistance Training   Training Prescription Yes    Weight 2 lbs    Reps 10-15    Time 5 Minutes      Interval Training   Interval Training No      NuStep   Level 1    SPM 94    Minutes 30    METs 2.6      Home Exercise Plan   Plans to continue exercise at Home (comment)    Frequency Add 3 additional days to program exercise sessions.    Initial Home Exercises Provided 07/30/24          Nutrition:  Target Goals: Understanding of nutrition guidelines, daily intake of sodium 1500mg , cholesterol 200mg , calories 30% from fat and 7% or less from saturated fats, daily to have 5 or more servings of fruits and vegetables.  Education: Nutrition 1 -Group instruction provided by verbal, written material, interactive activities, discussions, models, and posters to present general guidelines for heart healthy nutrition including  macronutrients, label reading, and promoting whole foods over processed counterparts. Education serves as Pensions consultant of discussion of heart healthy eating for all. Written material provided at class time.    Education: Nutrition 2 -Group instruction provided by verbal, written material, interactive activities, discussions, models, and posters to present general guidelines for heart healthy nutrition including sodium, cholesterol, and saturated fat. Providing guidance of habit forming to improve blood pressure, cholesterol, and body weight. Written material provided at class time.     Biometrics:    Nutrition Therapy Plan and Nutrition  Goals:  Nutrition Therapy & Goals - 06/30/24 1339       Nutrition Therapy   Diet Heart Healthy Diet    Drug/Food Interactions Statins/Certain Fruits      Personal Nutrition Goals   Nutrition Goal Patient to identify strategies for reducing cardiovascular risk by attending the [employer] education and nutrition series weekly.    Personal Goal #2 Patient to improve diet quality by using the plate method as a guide for meal planning to include lean protein/plant protein, fruits, vegetables, whole grains, nonfat dairy as part of a well-balanced diet.    Comments Linda Cardenas has medical history of AD s/p CABG x 3, hypertension, hyperlipidemia, hypothyroidism, prediabetes, arthritis, and GERD . LDL is at goal. A1c is in a prediabetic range. Patient will benefit from participation in intensive cardiac rehab for nutrition education, exercise, and lifestyle modification.      Intervention Plan   Intervention Prescribe, educate and counsel regarding individualized specific dietary modifications aiming towards targeted core components such as weight, hypertension, lipid management, diabetes, heart failure and other comorbidities.;Nutrition handout(s) given to patient.    Expected Outcomes Short Term Goal: Understand basic principles of dietary content, such as calories, fat, sodium, cholesterol and nutrients.;Long Term Goal: Adherence to prescribed nutrition plan.          Nutrition Assessments:  Nutrition Assessments - 06/30/24 1350       Rate Your Plate Scores   Pre Score 70         MEDIFICTS Score Key: >=70 Need to make dietary changes  40-70 Heart Healthy Diet <= 40 Therapeutic Level Cholesterol Diet  Flowsheet Row INTENSIVE CARDIAC REHAB from 06/30/2024 in [facility]  Picture Your Plate Total Score on Admission 70   Picture Your Plate Scores: <59 Unhealthy dietary pattern with much room for improvement. 41-50 Dietary pattern  unlikely to meet recommendations for good health and room for improvement. 51-60 More healthful dietary pattern, with some room for improvement.  >60 Healthy dietary pattern, although there may be some specific behaviors that could be improved.    Nutrition Goals Re-Evaluation:  Nutrition Goals Re-Evaluation     Row Name 06/30/24 1339             Goals   Current Weight 169 lb 1.5 oz (76.7 kg)       Comment LPa 201.6, A1c 6.0, LDL 67       Expected Outcome Linda Cardenas has medical history of AD s/p CABG x 3, hypertension, hyperlipidemia, hypothyroidism, prediabetes, arthritis, and GERD . LDL is at goal. A1c is in a prediabetic range. Patient will benefit from participation in intensive cardiac rehab for nutrition education, exercise, and lifestyle modification.          Nutrition Goals Discharge (Final Nutrition Goals Re-Evaluation):  Nutrition Goals Re-Evaluation - 06/30/24 1339       Goals   Current Weight 169 lb 1.5 oz (76.7 kg)  Comment LPa 201.6, A1c 6.0, LDL 67    Expected Outcome Linda Cardenas has medical history of AD s/p CABG x 3, hypertension, hyperlipidemia, hypothyroidism, prediabetes, arthritis, and GERD . LDL is at goal. A1c is in a prediabetic range. Patient will benefit from participation in intensive cardiac rehab for nutrition education, exercise, and lifestyle modification.          Psychosocial: Target Goals: Acknowledge presence or absence of significant depression and/or stress, maximize coping skills, provide positive support system. Participant is able to verbalize types and ability to use techniques and skills needed for reducing stress and depression.   Education: Stress, Anxiety, and Depression - Group verbal and visual presentation to define topics covered.  Reviews how body is impacted by stress, anxiety, and depression.  Also discusses healthy ways to reduce stress and to treat/manage anxiety and depression. Written material provided at class  time.   Education: Sleep Hygiene -Provides group verbal and written instruction about how sleep can affect your health.  Define sleep hygiene, discuss sleep cycles and impact of sleep habits. Review good sleep hygiene tips.   Initial Review & Psychosocial Screening:   Quality of Life Scores:   Scores of 19 and below usually indicate a poorer quality of life in these areas.  A difference of  2-3 points is a clinically meaningful difference.  A difference of 2-3 points in the total score of the Quality of Life Index has been associated with significant improvement in overall quality of life, self-image, physical symptoms, and general health in studies assessing change in quality of life.  PHQ-9: Review Flowsheet       06/24/2024  Depression screen PHQ 2/9  Decreased Interest 0  Down, Depressed, Hopeless 0  PHQ - 2 Score 0  Altered sleeping 0  Tired, decreased energy 1  Change in appetite 0  Feeling bad or failure about yourself  0  Trouble concentrating 0  Moving slowly or fidgety/restless 1  Suicidal thoughts 0  PHQ-9 Score 2  Difficult doing work/chores Somewhat difficult   Interpretation of Total Score  Total Score Depression Severity:  1-4 = Minimal depression, 5-9 = Mild depression, 10-14 = Moderate depression, 15-19 = Moderately severe depression, 20-27 = Severe depression   Psychosocial Evaluation and Intervention:   Psychosocial Re-Evaluation:  Psychosocial Re-Evaluation     Row Name 06/30/24 1433 07/28/24 0840 08/19/24 1546         Psychosocial Re-Evaluation   Current issues with Current Stress Concerns Current Stress Concerns Current Stress Concerns     Comments Reviewed PHQ9. Linda Cardenas reports feeling tired and has had little energy post surgery. Linda Cardenas says she has been sleeping in her recliner but has recently started sleeping in the bed at least one day a week No new psychosocial concerns voiced by pt during exercise at cardiac rehab No new psychosocial  concerns voiced by pt during exercise at cardiac rehab     Expected Outcomes Linda Cardenas will have controlled or decreased stressors upon completion of cardiac rehab. Linda Cardenas will have controlled or decreased stressors upon completion of cardiac rehab. Linda Cardenas will have controlled or decreased stressors upon completion of cardiac rehab.     Interventions Stress management education;Relaxation education;Encouraged to attend Cardiac Rehabilitation for the exercise Stress management education;Relaxation education;Encouraged to attend Cardiac Rehabilitation for the exercise Stress management education;Relaxation education;Encouraged to attend Cardiac Rehabilitation for the exercise     Continue Psychosocial Services  Follow up required by staff Follow up required by staff Follow up required by staff  Initial Review   Source of Stress Concerns Chronic Illness Chronic Illness --     Comments Will continue to monitor and offer support as needed. Will continue to monitor and offer support as needed. --        Psychosocial Discharge (Final Psychosocial Re-Evaluation):  Psychosocial Re-Evaluation - 08/19/24 1546       Psychosocial Re-Evaluation   Current issues with Current Stress Concerns    Comments No new psychosocial concerns voiced by pt during exercise at cardiac rehab    Expected Outcomes Linda Cardenas will have controlled or decreased stressors upon completion of cardiac rehab.    Interventions Stress management education;Relaxation education;Encouraged to attend Cardiac Rehabilitation for the exercise    Continue Psychosocial Services  Follow up required by staff          Vocational Rehabilitation: Provide vocational rehab assistance to qualifying candidates.   Vocational Rehab Evaluation & Intervention:   Education: Education Goals: Education classes will be provided on a variety of topics geared toward better understanding of heart health and risk factor modification. Participant will state  understanding/return demonstration of topics presented as noted by education test scores.  Learning Barriers/Preferences:   General Cardiac Education Topics:  AED/CPR: - Group verbal and written instruction with the use of models to demonstrate the basic use of the AED with the basic ABC's of resuscitation.   Test and Procedures: - Group verbal and visual presentation and models provide information about basic cardiac anatomy and function. Reviews the testing methods done to diagnose heart disease and the outcomes of the test results. Describes the treatment choices: Medical Management, Angioplasty, or Coronary Bypass Surgery for treating various heart conditions including Myocardial Infarction, Angina, Valve Disease, and Cardiac Arrhythmias. Written material provided at class time.   Medication Safety: - Group verbal and visual instruction to review commonly prescribed medications for heart and lung disease. Reviews the medication, class of the drug, and side effects. Includes the steps to properly store meds and maintain the prescription regimen. Written material provided at class time.   Intimacy: - Group verbal instruction through game format to discuss how heart and lung disease can affect sexual intimacy. Written material provided at class time.   Know Your Numbers and Heart Failure: - Group verbal and visual instruction to discuss disease risk factors for cardiac and pulmonary disease and treatment options.  Reviews associated critical values for Overweight/Obesity, Hypertension, Cholesterol, and Diabetes.  Discusses basics of heart failure: signs/symptoms and treatments.  Introduces Heart Failure Zone chart for action plan for heart failure. Written material provided at class time.   Infection Prevention: - Provides verbal and written material to individual with discussion of infection control including proper hand washing and proper equipment cleaning during exercise  session.   Falls Prevention: - Provides verbal and written material to individual with discussion of falls prevention and safety.   Other: -Provides group and verbal instruction on various topics (see comments)   Knowledge Questionnaire Score:   Core Components/Risk Factors/Patient Goals at Admission:   Education:Diabetes - Individual verbal and written instruction to review signs/symptoms of diabetes, desired ranges of glucose level fasting, after meals and with exercise. Acknowledge that pre and post exercise glucose checks will be done for 3 sessions at entry of program.   Core Components/Risk Factors/Patient Goals Review:   Goals and Risk Factor Review     Row Name 06/30/24 1437 07/28/24 0843 08/19/24 1547         Core Components/Risk Factors/Patient Goals Review   Personal  Goals Review Weight Management/Obesity;Hypertension;Lipids Weight Management/Obesity;Hypertension;Lipids Weight Management/Obesity;Hypertension;Lipids     Review Linda Cardenas started cardiac rehab on 06/30/24. Linda Cardenas did well with exercise. Vital signs were stable. Linda Cardenas is somewhat deconditioned. Linda Cardenas is doing well with exercise at cardiac rehab. Vital signs have been stable. Linda Cardenas has increased his met levels. Linda Cardenas is doing well with exercise at cardiac rehab. VSS, met levels maintained since last 30days.     Expected Outcomes Annaya will continue to participate in cardiac rehab for exercise, nutrition and lifestyle modificaitons Linda Cardenas will continue to participate in cardiac rehab for exercise, nutrition and lifestyle modificaitons Linda Cardenas will continue to participate in cardiac rehab for exercise, nutrition and lifestyle modificaitons        Core Components/Risk Factors/Patient Goals at Discharge (Final Review):   Goals and Risk Factor Review - 08/19/24 1547       Core Components/Risk Factors/Patient Goals Review   Personal Goals Review Weight Management/Obesity;Hypertension;Lipids    Review Linda Cardenas  is doing well with exercise at cardiac rehab. VSS, met levels maintained since last 30days.    Expected Outcomes Linda Cardenas will continue to participate in cardiac rehab for exercise, nutrition and lifestyle modificaitons          ITP Comments:  ITP Comments     Row Name 06/30/24 1432 07/28/24 0840 08/19/24 1546       ITP Comments 30 Day ITP Review. Linda Cardenas started cardiac rehab on 06/30/24 Linda Cardenas did well with exercise for her fitness level 30 Day ITP Review. Linda Cardenas has good attendance and participation with exercise at cardiac rehab 30 Day ITP Review. Linda Cardenas continues to have good attendance and participation with exercise at cardiac rehab        Comments: see ITP comments

## 2024-08-27 ENCOUNTER — Encounter (HOSPITAL_COMMUNITY)
Admission: RE | Admit: 2024-08-27 | Discharge: 2024-08-27 | Disposition: A | Discharge status: Home / Self Care | Payer: Self-pay | Source: Ambulatory Visit | Attending: Cardiology | Admitting: Cardiology

## 2024-08-27 DIAGNOSIS — R739 Hyperglycemia, unspecified: Secondary | ICD-10-CM

## 2024-08-27 DIAGNOSIS — Z951 Presence of aortocoronary bypass graft: Secondary | ICD-10-CM | POA: Diagnosis not present

## 2024-08-29 ENCOUNTER — Encounter (HOSPITAL_COMMUNITY)

## 2024-09-01 ENCOUNTER — Encounter (HOSPITAL_COMMUNITY)
Admission: RE | Admit: 2024-09-01 | Discharge: 2024-09-01 | Disposition: A | Discharge status: Home / Self Care | Payer: Self-pay | Source: Ambulatory Visit | Attending: Cardiology | Admitting: Cardiology

## 2024-09-01 DIAGNOSIS — Z951 Presence of aortocoronary bypass graft: Secondary | ICD-10-CM

## 2024-09-03 ENCOUNTER — Encounter (HOSPITAL_COMMUNITY)
Admission: RE | Admit: 2024-09-03 | Discharge: 2024-09-03 | Disposition: A | Discharge status: Home / Self Care | Source: Ambulatory Visit | Attending: Cardiology | Admitting: Cardiology

## 2024-09-03 DIAGNOSIS — R739 Hyperglycemia, unspecified: Secondary | ICD-10-CM

## 2024-09-03 DIAGNOSIS — Z951 Presence of aortocoronary bypass graft: Secondary | ICD-10-CM

## 2024-09-05 ENCOUNTER — Encounter (HOSPITAL_COMMUNITY)
Admission: RE | Admit: 2024-09-05 | Discharge: 2024-09-05 | Disposition: A | Discharge status: Home / Self Care | Source: Ambulatory Visit | Attending: Cardiology | Admitting: Cardiology

## 2024-09-05 ENCOUNTER — Encounter (HOSPITAL_COMMUNITY)

## 2024-09-05 DIAGNOSIS — Z951 Presence of aortocoronary bypass graft: Secondary | ICD-10-CM

## 2024-09-08 ENCOUNTER — Encounter (HOSPITAL_COMMUNITY)
Admission: RE | Admit: 2024-09-08 | Discharge: 2024-09-08 | Disposition: A | Discharge status: Home / Self Care | Source: Ambulatory Visit | Attending: Cardiology | Admitting: Cardiology

## 2024-09-08 DIAGNOSIS — Z951 Presence of aortocoronary bypass graft: Secondary | ICD-10-CM

## 2024-09-08 DIAGNOSIS — R739 Hyperglycemia, unspecified: Secondary | ICD-10-CM

## 2024-09-10 ENCOUNTER — Encounter (HOSPITAL_COMMUNITY)
Admission: RE | Admit: 2024-09-10 | Discharge: 2024-09-10 | Disposition: A | Discharge status: Home / Self Care | Source: Ambulatory Visit | Attending: Cardiology | Admitting: Cardiology

## 2024-09-10 VITALS — Ht <= 58 in | Wt 164.2 lb

## 2024-09-10 DIAGNOSIS — Z951 Presence of aortocoronary bypass graft: Secondary | ICD-10-CM

## 2024-09-12 ENCOUNTER — Encounter (HOSPITAL_COMMUNITY)

## 2024-09-15 ENCOUNTER — Encounter (HOSPITAL_COMMUNITY)
Admission: RE | Admit: 2024-09-15 | Discharge: 2024-09-15 | Disposition: A | Discharge status: Home / Self Care | Payer: Self-pay | Source: Ambulatory Visit | Attending: Cardiology | Admitting: Cardiology

## 2024-09-15 DIAGNOSIS — Z951 Presence of aortocoronary bypass graft: Secondary | ICD-10-CM

## 2024-09-15 DIAGNOSIS — R739 Hyperglycemia, unspecified: Secondary | ICD-10-CM

## 2024-09-15 NOTE — Progress Notes (Signed)
 Discharge Progress Report  Patient Details  Name: Linda Cardenas MRN: [id number] Date of Birth: 11-28-1955 Referring Provider:   Flowsheet Row INTENSIVE CARDIAC REHAB ORIENT from 06/24/2024 in [facility]  Referring Provider Gordy Bergamo, MD     Number of Visits: 81  Reason for Discharge:  Patient reached a stable level of exercise. Patient independent in their exercise. Patient has met program and personal goals.  Smoking History:  Social History   Tobacco Use  Smoking Status Never  Smokeless Tobacco Never    Diagnosis:  CABG x 3 (LIMA-LAD, SVG-OM, SVG-diagonal) on 03/17/2024  Hyperglycemia  ADL UCSD:   Initial Exercise Prescription:  Initial Exercise Prescription - 06/24/24 1200       Date of Initial Exercise RX and Referring Provider   Date 06/24/24    Referring Provider Gordy Bergamo, MD    Expected Discharge Date 09/17/24      NuStep   Level 1    SPM 70    Minutes 25    METs 1.8      Prescription Details   Frequency (times per week) 3    Duration Progress to 30 minutes of continuous aerobic without signs/symptoms of physical distress      Intensity   THRR 40-80% of Max Heartrate 61-122    Ratings of Perceived Exertion 11-13    Perceived Dyspnea 0-4      Progression   Progression Continue progressive overload as per policy without signs/symptoms or physical distress.      Resistance Training   Training Prescription Yes    Weight 2 lbs    Reps 10-15          Discharge Exercise Prescription (Final Exercise Prescription Changes):  Exercise Prescription Changes - 08/25/24 1700       Response to Exercise   Blood Pressure (Admit) 112/64    Blood Pressure (Exit) 114/70    Heart Rate (Admit) 66 bpm    Heart Rate (Exercise) 91 bpm    Heart Rate (Exit) 71 bpm    Rating of Perceived Exertion (Exercise) 9    Symptoms None    Comments Reviewed METS and goals    Duration Continue with 30 min of  aerobic exercise without signs/symptoms of physical distress.    Intensity THRR unchanged      Progression   Progression Continue to progress workloads to maintain intensity without signs/symptoms of physical distress.    Average METs 2.6      Resistance Training   Training Prescription Yes    Weight 2 lbs    Reps 10-15    Time 5 Minutes      Interval Training   Interval Training No      NuStep   Level 1    SPM 94    Minutes 30    METs 2.6      Home Exercise Plan   Plans to continue exercise at Home (comment)    Frequency Add 3 additional days to program exercise sessions.    Initial Home Exercises Provided 07/30/24          Functional Capacity:  6 Minute Walk     Row Name 06/24/24 1213 09/10/24 1300       6 Minute Walk   Phase Initial Discharge    Distance 828 feet 1209 feet    Distance % Change -- 46 %    Distance Feet Change -- 381 ft    Walk Time 6  minutes 6 minutes    # of Rest Breaks 0 0    MPH 1.7 2.3    METS 1.81 3    RPE 9 9    Perceived Dyspnea  0 0    VO2 Peak 6.34 10.4    Symptoms No No    Resting HR 53 bpm 61 bpm    Resting BP 98/70 120/78    Resting Oxygen Saturation  98 % --    Exercise Oxygen Saturation  during 6 min walk 98 % --    Max Ex. HR 107 bpm 132 bpm    Max Ex. BP 150/72 168/80    2 Minute Post BP 120/70 134/70       Psychological, QOL, Others - Outcomes: PHQ 2/9:    09/15/2024    1:28 PM 06/24/2024   12:26 PM  Depression screen PHQ 2/9  Decreased Interest 0 0  Down, Depressed, Hopeless 0 0  PHQ - 2 Score 0 0  Altered sleeping 0 0  Tired, decreased energy 1 1  Change in appetite 0 0  Feeling bad or failure about yourself  0 0  Trouble concentrating 0 0  Moving slowly or fidgety/restless 0 1  Suicidal thoughts 0 0  PHQ-9 Score 1 2  Difficult doing work/chores Not difficult at all Somewhat difficult    Quality of Life:  Quality of Life - 06/24/24 1217       Quality of Life   Select Quality of Life       Quality of Life Scores   Health/Function Pre 26.14 %    Socioeconomic Pre 30 %    Psych/Spiritual Pre 30 %    Family Pre 30 %    GLOBAL Pre 28.31 %          Personal Goals: Goals established at orientation with interventions provided to work toward goal.  Personal Goals and Risk Factors at Admission - 06/24/24 1220       Core Components/Risk Factors/Patient Goals on Admission    Weight Management Weight Loss    Hypertension Yes    Intervention Provide education on lifestyle modifcations including regular physical activity/exercise, weight management, moderate sodium restriction and increased consumption of fresh fruit, vegetables, and low fat dairy, alcohol moderation, and smoking cessation.;Monitor prescription use compliance.    Expected Outcomes Short Term: Continued assessment and intervention until BP is < 140/39mm HG in hypertensive participants. < 130/67mm HG in hypertensive participants with diabetes, heart failure or chronic kidney disease.;Long Term: Maintenance of blood pressure at goal levels.    Lipids Yes    Intervention Provide education and support for participant on nutrition & aerobic/resistive exercise along with prescribed medications to achieve LDL 70mg , HDL >40mg .    Expected Outcomes Short Term: Participant states understanding of desired cholesterol values and is compliant with medications prescribed. Participant is following exercise prescription and nutrition guidelines.;Long Term: Cholesterol controlled with medications as prescribed, with individualized exercise RX and with personalized nutrition plan. Value goals: LDL < 70mg , HDL > 40 mg.           Personal Goals Discharge:  Goals and Risk Factor Review     Row Name 06/30/24 1437 07/28/24 0843 08/19/24 1547         Core Components/Risk Factors/Patient Goals Review   Personal Goals Review Weight Management/Obesity;Hypertension;Lipids Weight Management/Obesity;Hypertension;Lipids Weight  Management/Obesity;Hypertension;Lipids     Review Linda Cardenas started cardiac rehab on 06/30/24. Linda Cardenas did well with exercise. Vital signs were stable. Linda Cardenas is somewhat deconditioned. Linda Cardenas is  doing well with exercise at cardiac rehab. Vital signs have been stable. Linda Cardenas has increased his met levels. Linda Cardenas is doing well with exercise at cardiac rehab. VSS, met levels maintained since last 30days.     Expected Outcomes Linda Cardenas will continue to participate in cardiac rehab for exercise, nutrition and lifestyle modificaitons Linda Cardenas will continue to participate in cardiac rehab for exercise, nutrition and lifestyle modificaitons Linda Cardenas will continue to participate in cardiac rehab for exercise, nutrition and lifestyle modificaitons        Exercise Goals and Review:  Exercise Goals     Row Name 06/24/24 1215             Exercise Goals   Increase Physical Activity Yes       Intervention Provide advice, education, support and counseling about physical activity/exercise needs.;Develop an individualized exercise prescription for aerobic and resistive training based on initial evaluation findings, risk stratification, comorbidities and participant's personal goals.       Expected Outcomes Short Term: Attend rehab on a regular basis to increase amount of physical activity.;Long Term: Add in home exercise to make exercise part of routine and to increase amount of physical activity.;Long Term: Exercising regularly at least 3-5 days a week.       Increase Strength and Stamina Yes       Intervention Provide advice, education, support and counseling about physical activity/exercise needs.;Develop an individualized exercise prescription for aerobic and resistive training based on initial evaluation findings, risk stratification, comorbidities and participant's personal goals.       Expected Outcomes Short Term: Increase workloads from initial exercise prescription for resistance, speed, and METs.;Short Term:  Perform resistance training exercises routinely during rehab and add in resistance training at home;Long Term: Improve cardiorespiratory fitness, muscular endurance and strength as measured by increased METs and functional capacity ( )       Able to understand and use rate of perceived exertion (RPE) scale Yes       Intervention Provide education and explanation on how to use RPE scale       Expected Outcomes Short Term: Able to use RPE daily in rehab to express subjective intensity level;Long Term:  Able to use RPE to guide intensity level when exercising independently       Knowledge and understanding of Target Heart Rate Range (THRR) Yes       Intervention Provide education and explanation of THRR including how the numbers were predicted and where they are located for reference       Expected Outcomes Short Term: Able to state/look up THRR;Long Term: Able to use THRR to govern intensity when exercising independently;Short Term: Able to use daily as guideline for intensity in rehab       Understanding of Exercise Prescription Yes       Intervention Provide education, explanation, and written materials on patient's individual exercise prescription       Expected Outcomes Short Term: Able to explain program exercise prescription;Long Term: Able to explain home exercise prescription to exercise independently          Exercise Goals Re-Evaluation:  Exercise Goals Re-Evaluation     Row Name 06/30/24 1424 07/25/24 1515 08/25/24 1701         Exercise Goal Re-Evaluation   Exercise Goals Review Increase Physical Activity;Understanding of Exercise Prescription;Increase Strength and Stamina;Knowledge and understanding of Target Heart Rate Range (THRR);Able to understand and use rate of perceived exertion (RPE) scale Increase Physical Activity;Understanding of Exercise Prescription;Increase Strength and Stamina;Knowledge and understanding  of Target Heart Rate Range (THRR);Able to understand and use  rate of perceived exertion (RPE) scale Increase Physical Activity;Understanding of Exercise Prescription;Increase Strength and Stamina;Knowledge and understanding of Target Heart Rate Range (THRR);Able to understand and use rate of perceived exertion (RPE) scale     Comments Pt's first day in the [facility] program. Pt understnads the RPE scale, THRR, and exercise Rx. Reviewed goals. Pt voices porgress on her goal of increased strength. Pt has goal of weight loss, but has not achieved any significant weight loss. Pt voices she is eating more vegtables and no red meat. Will continue to monitor and encourage pt on her weight loss. Reviewed goals and METs. Pt continue to voice some progress on her goal of increased strength. Pt has goal of weight loss and has lost 1.5 kg. Will continue to monitor and encourage pt on her weight loss.     Expected Outcomes Will continue to monitor patient and progress exercise workloads as tolerated. Will continue to monitor patient and progress exercise workloads as tolerated. Will continue to monitor patient and progress exercise workloads as tolerated.        Nutrition & Weight - Outcomes:  Pre Biometrics - 06/24/24 0945       Pre Biometrics   Waist Circumference 43.75 inches    Hip Circumference 47.5 inches    Waist to Hip Ratio 0.92 %    Triceps Skinfold 32 mm    % Body Fat 49.4 %    Grip Strength 13 kg    Flexibility --   No performed due to h/o low back surgery   Single Leg Stand 2.68 seconds          Post Biometrics - 09/10/24 1250        Post  Biometrics   Height 4' 9 (1.448 m)    Weight 74.5 kg    Waist Circumference 43 inches    Hip Circumference 45 inches    Waist to Hip Ratio 0.96 %    BMI (Calculated) 35.53    Triceps Skinfold 32 mm    % Body Fat 48.5 %    Grip Strength 12 kg    Flexibility --   Not performed   Single Leg Stand 4.81 seconds          Nutrition:  Nutrition Therapy & Goals - 06/30/24 1339       Nutrition Therapy    Diet Heart Healthy Diet    Drug/Food Interactions Statins/Certain Fruits      Personal Nutrition Goals   Nutrition Goal Patient to identify strategies for reducing cardiovascular risk by attending the [employer] education and nutrition series weekly.    Personal Goal #2 Patient to improve diet quality by using the plate method as a guide for meal planning to include lean protein/plant protein, fruits, vegetables, whole grains, nonfat dairy as part of a well-balanced diet.    Comments Joory has medical history of AD s/p CABG x 3, hypertension, hyperlipidemia, hypothyroidism, prediabetes, arthritis, and GERD . LDL is at goal. A1c is in a prediabetic range. Patient will benefit from participation in intensive cardiac rehab for nutrition education, exercise, and lifestyle modification.      Intervention Plan   Intervention Prescribe, educate and counsel regarding individualized specific dietary modifications aiming towards targeted core components such as weight, hypertension, lipid management, diabetes, heart failure and other comorbidities.;Nutrition handout(s) given to patient.    Expected Outcomes Short Term Goal: Understand basic principles of dietary content, such as calories, fat, sodium,  cholesterol and nutrients.;Long Term Goal: Adherence to prescribed nutrition plan.          Nutrition Discharge:  Nutrition Assessments - 06/30/24 1350       Rate Your Plate Scores   Pre Score 70          Education Questionnaire Score:  Knowledge Questionnaire Score - 06/24/24 1220       Knowledge Questionnaire Score   Pre Score 20/24         Pt graduates from  Intensive/Traditional cardiac rehab program today with completion of 48 exercise and education sessions. Pt maintained good attendance and progressed nicely during their participation in rehab as evidenced by increased MET level.   Medication list reconciled. Repeat  PHQ score-1.  Pt has made significant lifestyle changes and should be  commended for her success.  Linda Cardenas achieved her goals during cardiac rehab.   Pt plans to continue exercise at home, walking and doing lightweight exercises.  Goals reviewed with patient; copy given to patient.

## 2024-09-17 ENCOUNTER — Encounter (HOSPITAL_COMMUNITY)
Admission: RE | Admit: 2024-09-17 | Discharge: 2024-09-17 | Disposition: A | Discharge status: Home / Self Care | Source: Ambulatory Visit | Attending: Cardiology | Admitting: Cardiology

## 2024-09-17 DIAGNOSIS — Z951 Presence of aortocoronary bypass graft: Secondary | ICD-10-CM | POA: Diagnosis not present

## 2024-09-22 ENCOUNTER — Encounter: Payer: Self-pay | Admitting: Radiology

## 2024-09-29 ENCOUNTER — Other Ambulatory Visit (HOSPITAL_COMMUNITY): Payer: Self-pay

## 2024-10-07 ENCOUNTER — Ambulatory Visit: Attending: Internal Medicine | Admitting: Pharmacist Clinician (PhC)/ Clinical Pharmacy Specialist

## 2024-10-07 ENCOUNTER — Telehealth: Payer: Self-pay | Admitting: Pharmacist Clinician (PhC)/ Clinical Pharmacy Specialist

## 2024-10-07 ENCOUNTER — Other Ambulatory Visit (HOSPITAL_COMMUNITY): Payer: Self-pay

## 2024-10-07 ENCOUNTER — Encounter: Payer: Self-pay | Admitting: Pharmacist Clinician (PhC)/ Clinical Pharmacy Specialist

## 2024-10-07 ENCOUNTER — Telehealth: Payer: Self-pay | Admitting: Pharmacy Technician

## 2024-10-07 DIAGNOSIS — E785 Hyperlipidemia, unspecified: Secondary | ICD-10-CM | POA: Diagnosis present

## 2024-10-07 NOTE — Telephone Encounter (Signed)
 Please do PA for Repatha

## 2024-10-07 NOTE — Assessment & Plan Note (Signed)
 Assessment: Patient with ASCVD not at LDL goal of < 55 Most recent LDL 70 on 07/11/24 Has been compliant with high intensity statin : rosuvastatin  40 Reviewed options for lowering LDL cholesterol, including PCSK-9 inhibitors and inclisiran.  Discussed mechanisms of action, dosing, side effects, potential decreases in LDL cholesterol and costs.  Also reviewed potential options for patient assistance.  Plan: Patient agreeable to starting Repatha 140 mg q14d Continue with rosuvastatin  40 mg daily Repeat labs after:  3 months Lipid Liver function Patient was given information on manufacturer copay card

## 2024-10-07 NOTE — Telephone Encounter (Signed)
 Waiting for ins card

## 2024-10-07 NOTE — Patient Instructions (Signed)
 Your Results:             Your most recent labs Goal  Total Cholesterol 155 < 200  Triglycerides 73 < 150  HDL (happy/good cholesterol) 71 > 40  LDL (lousy/bad cholesterol 70 < 70   Medication changes:  We will start the process to get Repatha covered by your insurance.  Once the prior authorization is complete, I will call/send a MyChart message to let you know and confirm pharmacy information.   You will take one injection every 14 days  Lab orders:  We want to repeat labs after 2-3 months.  We will send you a lab order to remind you once we get closer to that time.    Patient Assistance:    Repatha Co-pay Card Instructions 1.      Go to [email or web address]  2.      Click the red "Repahta Co-Pay Card" button near the top right 3.      Scroll down and click "Yes, I have a Repatha prescription 4.      Continue to scroll down and selected "[employer]"  5.      Continue to scroll down and for the question "Are you eligible for Medicare but receiving prescription drug coverage from a former employer, union, or welfare plan?" select "No" 6.      Continue to scroll down and click the first box which is next to "Repatha Co-Pay Card, and beneath that, select "I want to enroll in the Repatha Co-Pay Card Program 7.      Continue to scroll down until you see the "Next" button, then click it 8.      Fill out your personal information then click the "Next" button    Thank you for choosing [facility]

## 2024-10-07 NOTE — Progress Notes (Signed)
 Office Visit    Patient Name: Linda Cardenas Date of Encounter: 10/07/2024  Primary Care Provider:  Dayna Motto, DO Primary Cardiologist:  Gordy Bergamo, MD  Chief Complaint    Hyperlipidemia   Significant Past Medical History   CAD 4/25 NSTEMI, CABG x 3, on clopidogrel ,   HTN Controlled on losartan , furosemide , KCl  HFpEF Noted at 4/25 hospitalization  preDM 4/25 A1c 6.0        Allergies  Allergen Reactions   Bovine (Beef) Protein-Containing Drug Products     Religion    Porcine (Pork) Protein-Containing Drug Products     religion    History of Present Illness    Linda Cardenas is a 68 y.o. female patient of Dr Bergamo, in the office today to discuss options for cholesterol management.  Insurance Carrier: [employer] (husband currently working, pt has Medicare A and B, states signing up for [employer] starting in 2026)  LDL Cholesterol goal:  < 55  Current Medications: rosuvastatin  40 mg daily  Family Hx:   father had died coronary thrombosis at 59, mother lived to 29, pacemaker; brother has stent, sister in [place]; 2 children, no known elevated cholesterol    Social Hx: Tobacco:no Alcohol: no  Diet:    chicken is main protein; , using olive oil; some vegetables, always fresh; , rice once daily, bread in the mornings, oatmeal with fruit; does snack some - crisps  Exercise: did cardiac rehab, now doing some exercise , did have fall recently - so a bit timid to get back out  Accessory Clinical Findings   Lab Results  Component Value Date   CHOL 155 07/11/2024   HDL 71 07/11/2024   LDLCALC 70 07/11/2024   TRIG 73 07/11/2024   CHOLHDL 2.2 07/11/2024    Lipoprotein (a)  Date/Time Value Ref Range Status  03/15/2024 02:23 AM 201.6 (H) <75.0 nmol/L Final    Comment:    (NOTE) Note:  Values greater than or equal to 75.0 nmol/L may       indicate an independent risk factor for CHD,       but must be evaluated with caution when applied       to non-Caucasian  populations due to the       influence of genetic factors on Lp(a) across       ethnicities. Performed At: [facility] [address] [place], [place] [id number] Jennette Shorter MD Ey:[id number]     Lab Results  Component Value Date   ALT 28 07/11/2024   AST 30 07/11/2024   ALKPHOS 57 07/11/2024   BILITOT 0.5 07/11/2024   Lab Results  Component Value Date   CREATININE 0.58 07/11/2024   BUN 8 07/11/2024   NA 141 07/11/2024   K 4.2 07/11/2024   CL 103 07/11/2024   CO2 24 07/11/2024   Lab Results  Component Value Date   HGBA1C 6.0 (H) 03/14/2024    Home Medications    Current Outpatient Medications  Medication Sig Dispense Refill   acetaminophen  (TYLENOL ) 500 MG tablet Take 1-2 tablets (500-1,000 mg total) by mouth every 6 (six) hours as needed.     calcium  carbonate (OS-CAL) 600 MG TABS Take 600 mg by mouth daily.     Cholecalciferol (VITAMIN D) 2000 UNITS tablet Take 2,000 Units by mouth daily.     clopidogrel  (PLAVIX ) 75 MG tablet Take 1 tablet (75 mg total) by mouth daily. 90 tablet 3   ferrous sulfate  325 (65 FE) MG tablet Take 1  tablet (325 mg total) by mouth daily with breakfast. 30 tablet 0   fluticasone -salmeterol (WIXELA INHUB) 250-50 MCG/ACT AEPB Inhale 1 puff into the lungs in the morning and at bedtime. (Patient taking differently: Inhale 1 puff into the lungs 2 (two) times daily as needed.) 1 each 5   furosemide  (LASIX ) 40 MG tablet TAKE 1 TABLET BY MOUTH EVERY DAY AS NEEDED FOR FLUID OR EDEMA,SWELLING OR WEIGHT GAIN OR SHORTNESS OF BREATH 90 tablet 1   gabapentin  (NEURONTIN ) 300 MG/6ML solution 2 mL Orally twice a day for 30 days     levothyroxine  (SYNTHROID ) 100 MCG tablet Take 100 mcg by mouth daily before breakfast.     losartan  (COZAAR ) 25 MG tablet Take 1/2 tablet (12.5 mg total) by mouth every evening. 45 tablet 3   methocarbamol  (ROBAXIN ) 500 MG tablet Take 0.5 tablets (250 mg total) by mouth every 8 (eight) hours as needed for muscle spasms.  (Patient not taking: Reported on 09/15/2024) 15 tablet 0   Multiple Vitamins-Minerals (MULTIVITAMIN WITH MINERALS) tablet Take 1 tablet by mouth daily.     nitroGLYCERIN  (NITROSTAT ) 0.4 MG SL tablet Place 1 tablet (0.4 mg total) under the tongue every 5 (five) minutes as needed. 25 tablet 1   potassium chloride  SA (KLOR-CON  M) 20 MEQ tablet Take 1 tablet (20 mEq total) by mouth daily. Take 1 tablet daily for 7 days then take 1 tablet every time you take Lasix  30 tablet 0   rosuvastatin  (CRESTOR ) 40 MG tablet Take 40 mg by mouth daily.     sertraline (ZOLOFT) 25 MG tablet 1 tablet Orally Once a day for 30 day(s)     traMADol  (ULTRAM ) 50 MG tablet Take 1 tablet (50 mg total) by mouth every 4 (four) hours as needed for moderate pain (pain score 4-6). (Patient not taking: Reported on 07/11/2024) 30 tablet 0   No current facility-administered medications for this visit.     Assessment & Plan    Hyperlipidemia Assessment: Patient with ASCVD not at LDL goal of < 55 Most recent LDL 70 on 07/11/24 Has been compliant with high intensity statin : rosuvastatin  40 Reviewed options for lowering LDL cholesterol, including PCSK-9 inhibitors and inclisiran.  Discussed mechanisms of action, dosing, side effects, potential decreases in LDL cholesterol and costs.  Also reviewed potential options for patient assistance.  Plan: Patient agreeable to starting Repatha 140 mg q14d Continue with rosuvastatin  40 mg daily Repeat labs after:  3 months Lipid Liver function Patient was given information on manufacturer copay card   Allean Mink, PharmD CPP [facility] [address]   [place], [place] [address] [phone]  10/07/2024, 9:58 AM

## 2024-10-10 ENCOUNTER — Other Ambulatory Visit (HOSPITAL_COMMUNITY): Payer: Self-pay

## 2024-10-30 ENCOUNTER — Ambulatory Visit: Admitting: Pulmonary Disease

## 2025-01-27 ENCOUNTER — Ambulatory Visit: Admitting: Pulmonary Disease
